# Patient Record
Sex: Male | Born: 1985
Health system: Southern US, Community
[De-identification: ages and names within clinical notes are randomized; demographics above are authoritative.]

## PROBLEM LIST (undated history)

## (undated) DIAGNOSIS — M2550 Pain in unspecified joint: Secondary | ICD-10-CM

## (undated) DIAGNOSIS — R079 Chest pain, unspecified: Secondary | ICD-10-CM

## (undated) DIAGNOSIS — M79672 Pain in left foot: Secondary | ICD-10-CM

## (undated) DIAGNOSIS — M79671 Pain in right foot: Secondary | ICD-10-CM

## (undated) DIAGNOSIS — I10 Essential (primary) hypertension: Secondary | ICD-10-CM

## (undated) DIAGNOSIS — L509 Urticaria, unspecified: Secondary | ICD-10-CM

## (undated) DIAGNOSIS — L309 Dermatitis, unspecified: Secondary | ICD-10-CM

## (undated) DIAGNOSIS — F32A Depression, unspecified: Secondary | ICD-10-CM

## (undated) DIAGNOSIS — R0602 Shortness of breath: Secondary | ICD-10-CM

## (undated) DIAGNOSIS — M25519 Pain in unspecified shoulder: Secondary | ICD-10-CM

## (undated) DIAGNOSIS — T7840XA Allergy, unspecified, initial encounter: Secondary | ICD-10-CM

## (undated) DIAGNOSIS — M549 Dorsalgia, unspecified: Secondary | ICD-10-CM

## (undated) DIAGNOSIS — F419 Anxiety disorder, unspecified: Secondary | ICD-10-CM

## (undated) DIAGNOSIS — M25569 Pain in unspecified knee: Secondary | ICD-10-CM

## (undated) HISTORY — DX: Pain in unspecified shoulder: M25.519

## (undated) HISTORY — DX: Dorsalgia, unspecified: M54.9

## (undated) HISTORY — DX: Shortness of breath: R06.02

## (undated) HISTORY — DX: Anxiety disorder, unspecified: F41.9

## (undated) HISTORY — DX: Depression, unspecified: F32.A

## (undated) HISTORY — DX: Pain in right foot: M79.671

## (undated) HISTORY — DX: Urticaria, unspecified: L50.9

## (undated) HISTORY — DX: Pain in right foot: M79.672

## (undated) HISTORY — DX: Chest pain, unspecified: R07.9

## (undated) HISTORY — DX: Dermatitis, unspecified: L30.9

## (undated) HISTORY — DX: Pain in unspecified joint: M25.50

## (undated) HISTORY — DX: Pain in unspecified knee: M25.569

## (undated) HISTORY — DX: Allergy, unspecified, initial encounter: T78.40XA

---
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
## 2008-10-20 ENCOUNTER — Emergency Department (HOSPITAL_COMMUNITY): Admission: EM | Admit: 2008-10-20 | Discharge: 2008-10-20 | Payer: Self-pay | Admitting: Emergency Medicine

## 2012-12-11 ENCOUNTER — Ambulatory Visit (INDEPENDENT_AMBULATORY_CARE_PROVIDER_SITE_OTHER): Payer: BC Managed Care – PPO | Admitting: Family Medicine

## 2012-12-11 VITALS — BP 119/81 | HR 88 | Temp 97.8°F | Resp 16 | Ht 69.5 in | Wt 343.6 lb

## 2012-12-11 DIAGNOSIS — L259 Unspecified contact dermatitis, unspecified cause: Secondary | ICD-10-CM

## 2012-12-11 DIAGNOSIS — L309 Dermatitis, unspecified: Secondary | ICD-10-CM

## 2012-12-11 DIAGNOSIS — Z113 Encounter for screening for infections with a predominantly sexual mode of transmission: Secondary | ICD-10-CM

## 2012-12-11 DIAGNOSIS — Z0000 Encounter for general adult medical examination without abnormal findings: Secondary | ICD-10-CM

## 2012-12-11 LAB — POCT CBC
Granulocyte percent: 50.9 %G (ref 37–80)
HCT, POC: 50 % (ref 43.5–53.7)
Hemoglobin: 16 g/dL (ref 14.1–18.1)
Lymph, poc: 2.8 (ref 0.6–3.4)
MCH, POC: 27.8 pg (ref 27–31.2)
MCHC: 32 g/dL (ref 31.8–35.4)
MCV: 87 fL (ref 80–97)
MID (cbc): 0.5 (ref 0–0.9)
MPV: 8.7 fL (ref 0–99.8)
POC Granulocyte: 3.5 (ref 2–6.9)
POC LYMPH PERCENT: 41.6 %L (ref 10–50)
POC MID %: 7.5 % (ref 0–12)
Platelet Count, POC: 454 10*3/uL — AB (ref 142–424)
RBC: 5.75 M/uL (ref 4.69–6.13)
RDW, POC: 14.3 %
WBC: 6.8 10*3/uL (ref 4.6–10.2)

## 2012-12-11 LAB — COMPREHENSIVE METABOLIC PANEL
ALT: 19 U/L (ref 0–53)
Albumin: 4.4 g/dL (ref 3.5–5.2)
CO2: 30 mEq/L (ref 19–32)
Chloride: 102 mEq/L (ref 96–112)
Glucose, Bld: 81 mg/dL (ref 70–99)
Potassium: 4.8 mEq/L (ref 3.5–5.3)
Sodium: 140 mEq/L (ref 135–145)
Total Protein: 7.8 g/dL (ref 6.0–8.3)

## 2012-12-11 LAB — COMPREHENSIVE METABOLIC PANEL WITH GFR
AST: 30 U/L (ref 0–37)
Alkaline Phosphatase: 75 U/L (ref 39–117)
BUN: 18 mg/dL (ref 6–23)
Calcium: 10 mg/dL (ref 8.4–10.5)
Creat: 1.13 mg/dL (ref 0.50–1.35)
Total Bilirubin: 0.4 mg/dL (ref 0.3–1.2)

## 2012-12-11 LAB — HIV ANTIBODY (ROUTINE TESTING W REFLEX): HIV: NONREACTIVE

## 2012-12-11 LAB — RPR

## 2012-12-11 LAB — POCT GLYCOSYLATED HEMOGLOBIN (HGB A1C): Hemoglobin A1C: 5.1

## 2012-12-11 LAB — HEPATITIS B SURFACE ANTIGEN: Hepatitis B Surface Ag: NEGATIVE

## 2012-12-11 LAB — LDL CHOLESTEROL, DIRECT: Direct LDL: 141 mg/dL — ABNORMAL HIGH

## 2012-12-11 LAB — HEPATITIS C ANTIBODY: HCV Ab: NEGATIVE

## 2012-12-11 MED ORDER — TRIAMCINOLONE ACETONIDE 0.1 % EX CREA: TOPICAL_CREAM | Freq: Three times a day (TID) | CUTANEOUS | Status: DC

## 2012-12-11 NOTE — Progress Notes (Signed)
[facility]:  Office Visit  Chief Complaint:  Chief Complaint  Patient presents with  . Annual Exam    with forms    HPI: Shawn [name] is a 27 y.o. male who complains of  CPE. No complaints. Has eczema on hand. He is in graduate school at [employer], agricultural degree with an emphasis on ways to improve global food shortage . He denies having any STDs. Has some knee injuries from playing football in HS but never any surgeries.   Past Medical History  Diagnosis Date  . Allergy    History reviewed. No pertinent past surgical history. History   Social History  . Marital Status: Single    Spouse Name: N/A    Number of Children: N/A  . Years of Education: N/A   Social History Main Topics  . Smoking status: Never Smoker   . Smokeless tobacco: None  . Alcohol Use: Yes  . Drug Use: No  . Sexually Active: Yes    Birth Control/ Protection: Condom   Other Topics Concern  . None   Social History Narrative  . None   Family History  Problem Relation Age of Onset  . Cataracts Maternal Grandfather   . Diabetes Maternal Aunt   . Hypertension Maternal Aunt   . Diabetes Maternal Uncle    No Known Allergies Prior to Admission medications   Not on File     ROS: The patient denies fevers, chills, night sweats, unintentional weight loss, chest pain, palpitations, wheezing, dyspnea on exertion, nausea, vomiting, abdominal pain, dysuria, hematuria, melena, numbness, weakness, + tingling/numbnes   All other systems have been reviewed and were otherwise negative with the exception of those mentioned in the HPI and as above.    PHYSICAL EXAM: Filed Vitals:   12/11/12 1557  BP: 119/81  Pulse: 88  Temp: 97.8 F (36.6 C)  Resp: 16   Filed Vitals:   12/11/12 1557  Height: 5' 9.5" (1.765 m)  Weight: 343 lb 9.6 oz (155.856 kg)   Body mass index is 50.01 kg/(m^2).  General: Alert, no acute distress. Morbidly obese AA male HEENT:  Normocephalic, atraumatic,  oropharynx patent. EOMI, PERRLA, fundoscopic exam nl Cardiovascular:  Regular rate and rhythm, no rubs murmurs or gallops.  No Carotid bruits, radial pulse intact. No pedal edema.  Respiratory: Clear to auscultation bilaterally.  No wheezes, rales, or rhonchi.  No cyanosis, no use of accessory musculature GI: No organomegaly, abdomen is soft and non-tender, positive bowel sounds.  No masses. Skin: + right index, middle finer eczema  Neurologic: Facial musculature symmetric. Psychiatric: Patient is appropriate throughout our interaction. GU-nl testicles, no masses, lesions, no hernias. Lymphatic: No cervical lymphadenopathy Musculoskeletal: Gait intact. No scoliosis   LABS: Results for orders placed in visit on 12/11/12  POCT CBC      Component Value Range   WBC 6.8  4.6 - 10.2 K/uL   Lymph, poc 2.8  0.6 - 3.4   POC LYMPH PERCENT 41.6  10 - 50 %L   MID (cbc) 0.5  0 - 0.9   POC MID % 7.5  0 - 12 %M   POC Granulocyte 3.5  2 - 6.9   Granulocyte percent 50.9  37 - 80 %G   RBC 5.75  4.69 - 6.13 M/uL   Hemoglobin 16.0  14.1 - 18.1 g/dL   HCT, POC 78.4  69.6 - 53.7 %   MCV 87.0  80 - 97 fL   MCH, POC 27.8  27 - 31.2 pg   MCHC 32.0  31.8 - 35.4 g/dL   RDW, POC 40.9     Platelet Count, POC 454 (*) 142 - 424 K/uL   MPV 8.7  0 - 99.8 fL  POCT GLYCOSYLATED HEMOGLOBIN (HGB A1C)      Component Value Range   Hemoglobin A1C 5.1       EKG/XRAY:   Primary read interpreted by Dr. Conley Rolls at [facility].   ASSESSMENT/PLAN: Encounter Diagnoses  Name Primary?  . Annual physical exam Yes  . Screening for STD (sexually transmitted disease)   . Eczema    Doing well. D/w patient that his BMI was high and he understands he needs to lose weight.  We screened him for DM due to strong family hx and he was not diabetic Annual labs with STD screening.  Rx Triamcinolone cream for eczema, if too expensive can try vaseline F/u in 1 year or prn   Priyana Mccarey PHUONG, DO 12/11/2012 4:39 PM

## 2012-12-12 LAB — HSV(HERPES SIMPLEX VRS) I + II AB-IGG
HSV 1 Glycoprotein G Ab, IgG: 9.33 IV — ABNORMAL HIGH
HSV 2 Glycoprotein G Ab, IgG: 0.1 IV

## 2012-12-12 LAB — GC/CHLAMYDIA PROBE AMP, URINE
Chlamydia, Swab/Urine, PCR: NEGATIVE
GC Probe Amp, Urine: NEGATIVE

## 2012-12-12 LAB — HEPATITIS B SURFACE ANTIBODY, QUANTITATIVE: Hep B S AB Quant (Post): 9.4 m[IU]/mL

## 2012-12-14 ENCOUNTER — Telehealth: Payer: Self-pay | Admitting: Family Medicine

## 2012-12-14 NOTE — Telephone Encounter (Signed)
LM that he should call back for lab results. Whoever answers on clinical team can let him know that:  Electrolytes, kidney, liver are normal LDL cholesterol is slightly high but with diet and exercise that can be improved. No medicines needed now. His STD tests came back all negative. 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 Ask him if he has had his complete Hepatitis B vaccine series because he may need to get it again. His Hepatitis B titers show that he has not been vaccinated.

## 2012-12-15 ENCOUNTER — Telehealth: Payer: Self-pay

## 2012-12-15 NOTE — Telephone Encounter (Signed)
PT WAS RETURNING OUR CALL ABOUT HIS LAB RESULTS.  SEE DR. Irwin Brakeman PREVIOUS PHONE MESSAGE.  [phone]

## 2012-12-16 NOTE — Telephone Encounter (Signed)
Spoke with the pt and went over his results with him. He wanted to know if you could place future order for him to start hep B vaccination series. He stated in middle school he had Hep B series... Please adivse, pt will need call back once approval on hep b series.

## 2012-12-18 NOTE — Telephone Encounter (Signed)
Called patient. His hep B vaccine hx did not make sense---Hep B in middle school?  Told him to ask his mom about vaccine recors, did he get only 1 or 2 or all 3 injections for Hep B? If he does not need all 3 then we can just give him the injections he missed. He will call us back.

## 2013-02-09 ENCOUNTER — Ambulatory Visit (INDEPENDENT_AMBULATORY_CARE_PROVIDER_SITE_OTHER): Payer: BC Managed Care – PPO | Admitting: Family Medicine

## 2013-02-09 VITALS — BP 135/83 | HR 92 | Temp 98.9°F | Resp 18 | Ht 70.78 in | Wt 335.8 lb

## 2013-02-09 DIAGNOSIS — R1110 Vomiting, unspecified: Secondary | ICD-10-CM

## 2013-02-09 DIAGNOSIS — R05 Cough: Secondary | ICD-10-CM

## 2013-02-09 DIAGNOSIS — R059 Cough, unspecified: Secondary | ICD-10-CM

## 2013-02-09 MED ORDER — HYDROCODONE-HOMATROPINE 5-1.5 MG/5ML PO SYRP: ORAL_SOLUTION | ORAL | Status: DC

## 2013-02-09 MED ORDER — BENZONATATE 100 MG PO CAPS: 100.0000 mg | ORAL_CAPSULE | Freq: Three times a day (TID) | ORAL | Status: DC | PRN

## 2013-02-09 MED ORDER — AZITHROMYCIN 250 MG PO TABS: ORAL_TABLET | ORAL | Status: DC

## 2013-02-09 NOTE — Progress Notes (Signed)
Subjective:    Patient ID: Shawn [name], male    DOB: 1986-03-17, 27 y.o.   MRN: [id number]  HPI Shawn [name] is a 27 y.o. male  Cough - sore throat.  Cough for past week.  Subjective fever/chills.  Cough - productive - yellow mucus. No dyspnea. Slightly less sore throat, but still with productive cough.   Vomiting past few days.  Notes cough after eating, then post-tussive emesis.  No freestanding emesis. No diarrhea.  Drinking fluids ok. Less appetite, but keeping fluids down.  No known sick contacts.  Nonsmoker.   Tx: hot tea, benadryl, allegra.    Review of Systems  Constitutional: Negative for fever.  HENT: Positive for sore throat.   Respiratory: Positive for cough. Negative for shortness of breath and wheezing.   Gastrointestinal: Positive for vomiting. Negative for nausea, abdominal pain and diarrhea.  Musculoskeletal: Positive for myalgias.  Skin: Negative for rash.   As above.     Objective:   Physical Exam  Vitals reviewed. Constitutional: He is oriented to person, place, and time. He appears well-developed and well-nourished.  HENT:  Head: Normocephalic and atraumatic.  Right Ear: Tympanic membrane, external ear and ear canal normal.  Left Ear: Tympanic membrane, external ear and ear canal normal.  Nose: No rhinorrhea.  Mouth/Throat: Oropharynx is clear and moist and mucous membranes are normal. No oropharyngeal exudate or posterior oropharyngeal erythema.  Eyes: Conjunctivae are normal. Pupils are equal, round, and reactive to light.  Neck: Neck supple.  Cardiovascular: Normal rate, regular rhythm, normal heart sounds and intact distal pulses.   No murmur heard. Pulmonary/Chest: Effort normal and breath sounds normal. He has no wheezes. He has no rhonchi. He has no rales.  Abdominal: Soft. There is no tenderness.  Lymphadenopathy:    He has no cervical adenopathy.  Neurological: He is alert and oriented to person, place, and time.  Skin: Skin is  warm and dry. No rash noted.  Psychiatric: He has a normal mood and affect. His behavior is normal.          Assessment & Plan:  Shawn [name] is a 27 y.o. male Cough - Plan: benzonatate (TESSALON) 100 MG capsule, azithromycin (ZITHROMAX) 250 MG tablet, HYDROcodone-homatropine (HYCODAN) 5-1.5 MG/5ML syrup  Post-tussive emesis - Plan: benzonatate (TESSALON) 100 MG capsule, HYDROcodone-homatropine (HYCODAN) 5-1.5 MG/5ML syrup  URI- early bronchitis with postussive emesis - reassuring exam. Zpak.  Tessalon or mucinex during day, hycodan qhs prn.  fluids, bland diet. rtc precautions.  Patient Instructions  mucinex Dm OR tessalon perles during the day, hycodan at night if needed for cough. Return to the clinic or go to the nearest emergency room if any of your symptoms worsen or new symptoms occur.  Cough, Adult  A cough is a reflex that helps clear your throat and airways. It can help heal the body or may be a reaction to an irritated airway. A cough may only last 2 or 3 weeks (acute) or may last more than 8 weeks (chronic).  CAUSES Acute cough:  Viral or bacterial infections. Chronic cough:  Infections.  Allergies.  Asthma.  Post-nasal drip.  Smoking.  Heartburn or acid reflux.  Some medicines.  Chronic lung problems (COPD).  Cancer. SYMPTOMS   Cough.  Fever.  Chest pain.  Increased breathing rate.  High-pitched whistling sound when breathing (wheezing).  Colored mucus that you cough up (sputum). TREATMENT   A bacterial cough may be treated with antibiotic medicine.  A viral cough must run  its course and will not respond to antibiotics.  Your caregiver may recommend other treatments if you have a chronic cough. HOME CARE INSTRUCTIONS   Only take over-the-counter or prescription medicines for pain, discomfort, or fever as directed by your caregiver. Use cough suppressants only as directed by your caregiver.  Use a cold steam vaporizer or humidifier  in your bedroom or home to help loosen secretions.  Sleep in a semi-upright position if your cough is worse at night.  Rest as needed.  Stop smoking if you smoke. SEEK IMMEDIATE MEDICAL CARE IF:   You have pus in your sputum.  Your cough starts to worsen.  You cannot control your cough with suppressants and are losing sleep.  You begin coughing up blood.  You have difficulty breathing.  You develop pain which is getting worse or is uncontrolled with medicine.  You have a fever. MAKE SURE YOU:   Understand these instructions.  Will watch your condition.  Will get help right away if you are not doing well or get worse. Document Released: 05/06/2011 Document Revised: 01/30/2012 Document Reviewed: 05/06/2011 [facility] Patient Information 2013 [place], [place].

## 2013-02-09 NOTE — Patient Instructions (Signed)
mucinex Dm OR tessalon perles during the day, hycodan at night if needed for cough. Return to the clinic or go to the nearest emergency room if any of your symptoms worsen or new symptoms occur.  Cough, Adult  A cough is a reflex that helps clear your throat and airways. It can help heal the body or may be a reaction to an irritated airway. A cough may only last 2 or 3 weeks (acute) or may last more than 8 weeks (chronic).  CAUSES Acute cough:  Viral or bacterial infections. Chronic cough:  Infections.  Allergies.  Asthma.  Post-nasal drip.  Smoking.  Heartburn or acid reflux.  Some medicines.  Chronic lung problems (COPD).  Cancer. SYMPTOMS   Cough.  Fever.  Chest pain.  Increased breathing rate.  High-pitched whistling sound when breathing (wheezing).  Colored mucus that you cough up (sputum). TREATMENT   A bacterial cough may be treated with antibiotic medicine.  A viral cough must run its course and will not respond to antibiotics.  Your caregiver may recommend other treatments if you have a chronic cough. HOME CARE INSTRUCTIONS   Only take over-the-counter or prescription medicines for pain, discomfort, or fever as directed by your caregiver. Use cough suppressants only as directed by your caregiver.  Use a cold steam vaporizer or humidifier in your bedroom or home to help loosen secretions.  Sleep in a semi-upright position if your cough is worse at night.  Rest as needed.  Stop smoking if you smoke. SEEK IMMEDIATE MEDICAL CARE IF:   You have pus in your sputum.  Your cough starts to worsen.  You cannot control your cough with suppressants and are losing sleep.  You begin coughing up blood.  You have difficulty breathing.  You develop pain which is getting worse or is uncontrolled with medicine.  You have a fever. MAKE SURE YOU:   Understand these instructions.  Will watch your condition.  Will get help right away if you are not  doing well or get worse. Document Released: 05/06/2011 Document Revised: 01/30/2012 Document Reviewed: 05/06/2011 [facility] Patient Information 2013 [place], [place].

## 2017-04-15 DIAGNOSIS — S8392XA Sprain of unspecified site of left knee, initial encounter: Secondary | ICD-10-CM | POA: Diagnosis not present

## 2017-05-05 DIAGNOSIS — J189 Pneumonia, unspecified organism: Secondary | ICD-10-CM | POA: Diagnosis not present

## 2017-05-19 DIAGNOSIS — G8929 Other chronic pain: Secondary | ICD-10-CM | POA: Diagnosis not present

## 2017-05-19 DIAGNOSIS — M25562 Pain in left knee: Secondary | ICD-10-CM | POA: Diagnosis not present

## 2017-06-02 DIAGNOSIS — G8929 Other chronic pain: Secondary | ICD-10-CM | POA: Diagnosis not present

## 2017-06-02 DIAGNOSIS — M25562 Pain in left knee: Secondary | ICD-10-CM | POA: Diagnosis not present

## 2017-06-08 DIAGNOSIS — M25562 Pain in left knee: Secondary | ICD-10-CM | POA: Diagnosis not present

## 2017-06-08 DIAGNOSIS — G8929 Other chronic pain: Secondary | ICD-10-CM | POA: Diagnosis not present

## 2017-06-08 DIAGNOSIS — S86812A Strain of other muscle(s) and tendon(s) at lower leg level, left leg, initial encounter: Secondary | ICD-10-CM | POA: Diagnosis not present

## 2017-06-22 DIAGNOSIS — S86812D Strain of other muscle(s) and tendon(s) at lower leg level, left leg, subsequent encounter: Secondary | ICD-10-CM | POA: Diagnosis not present

## 2017-06-28 DIAGNOSIS — S86812D Strain of other muscle(s) and tendon(s) at lower leg level, left leg, subsequent encounter: Secondary | ICD-10-CM | POA: Diagnosis not present

## 2017-06-30 DIAGNOSIS — S86812D Strain of other muscle(s) and tendon(s) at lower leg level, left leg, subsequent encounter: Secondary | ICD-10-CM | POA: Diagnosis not present

## 2017-07-03 DIAGNOSIS — S86812D Strain of other muscle(s) and tendon(s) at lower leg level, left leg, subsequent encounter: Secondary | ICD-10-CM | POA: Diagnosis not present

## 2017-07-26 DIAGNOSIS — S86812D Strain of other muscle(s) and tendon(s) at lower leg level, left leg, subsequent encounter: Secondary | ICD-10-CM | POA: Diagnosis not present

## 2017-07-31 DIAGNOSIS — S86812D Strain of other muscle(s) and tendon(s) at lower leg level, left leg, subsequent encounter: Secondary | ICD-10-CM | POA: Diagnosis not present

## 2017-08-02 DIAGNOSIS — S86812D Strain of other muscle(s) and tendon(s) at lower leg level, left leg, subsequent encounter: Secondary | ICD-10-CM | POA: Diagnosis not present

## 2017-09-29 DIAGNOSIS — M25562 Pain in left knee: Secondary | ICD-10-CM | POA: Diagnosis not present

## 2017-09-29 DIAGNOSIS — S86812D Strain of other muscle(s) and tendon(s) at lower leg level, left leg, subsequent encounter: Secondary | ICD-10-CM | POA: Diagnosis not present

## 2017-09-29 DIAGNOSIS — G8929 Other chronic pain: Secondary | ICD-10-CM | POA: Diagnosis not present

## 2017-10-23 DIAGNOSIS — S86812D Strain of other muscle(s) and tendon(s) at lower leg level, left leg, subsequent encounter: Secondary | ICD-10-CM | POA: Diagnosis not present

## 2017-10-23 DIAGNOSIS — K080 Exfoliation of teeth due to systemic causes: Secondary | ICD-10-CM | POA: Diagnosis not present

## 2018-01-08 DIAGNOSIS — J209 Acute bronchitis, unspecified: Secondary | ICD-10-CM | POA: Diagnosis not present

## 2018-01-26 DIAGNOSIS — J209 Acute bronchitis, unspecified: Secondary | ICD-10-CM | POA: Diagnosis not present

## 2018-01-26 DIAGNOSIS — M791 Myalgia, unspecified site: Secondary | ICD-10-CM | POA: Diagnosis not present

## 2018-08-03 DIAGNOSIS — F4320 Adjustment disorder, unspecified: Secondary | ICD-10-CM | POA: Diagnosis not present

## 2018-08-17 DIAGNOSIS — F4320 Adjustment disorder, unspecified: Secondary | ICD-10-CM | POA: Diagnosis not present

## 2018-08-27 DIAGNOSIS — F4320 Adjustment disorder, unspecified: Secondary | ICD-10-CM | POA: Diagnosis not present

## 2018-09-05 DIAGNOSIS — F4320 Adjustment disorder, unspecified: Secondary | ICD-10-CM | POA: Diagnosis not present

## 2018-09-12 DIAGNOSIS — F4320 Adjustment disorder, unspecified: Secondary | ICD-10-CM | POA: Diagnosis not present

## 2018-10-16 DIAGNOSIS — M25561 Pain in right knee: Secondary | ICD-10-CM | POA: Diagnosis not present

## 2018-10-16 DIAGNOSIS — M25562 Pain in left knee: Secondary | ICD-10-CM | POA: Diagnosis not present

## 2018-10-22 ENCOUNTER — Ambulatory Visit (INDEPENDENT_AMBULATORY_CARE_PROVIDER_SITE_OTHER): Payer: Federal, State, Local not specified - PPO

## 2018-10-22 ENCOUNTER — Ambulatory Visit: Payer: Federal, State, Local not specified - PPO | Admitting: Family Medicine

## 2018-10-22 ENCOUNTER — Encounter: Payer: Self-pay | Admitting: Family Medicine

## 2018-10-22 VITALS — BP 120/86 | HR 86 | Temp 98.3°F | Ht 71.0 in | Wt 390.6 lb

## 2018-10-22 DIAGNOSIS — Z23 Encounter for immunization: Secondary | ICD-10-CM | POA: Diagnosis not present

## 2018-10-22 DIAGNOSIS — Z0000 Encounter for general adult medical examination without abnormal findings: Secondary | ICD-10-CM | POA: Diagnosis not present

## 2018-10-22 DIAGNOSIS — R918 Other nonspecific abnormal finding of lung field: Secondary | ICD-10-CM | POA: Diagnosis not present

## 2018-10-22 DIAGNOSIS — R05 Cough: Secondary | ICD-10-CM | POA: Diagnosis not present

## 2018-10-22 DIAGNOSIS — R059 Cough, unspecified: Secondary | ICD-10-CM

## 2018-10-22 IMAGING — DX DG CHEST 2V
2 series · 2 of 2 positions shown · non-contrast
Comparison: None.

CLINICAL DATA: Chronic cough for 12 months.

EXAM:
CHEST - 2 VIEW

[chest pa]
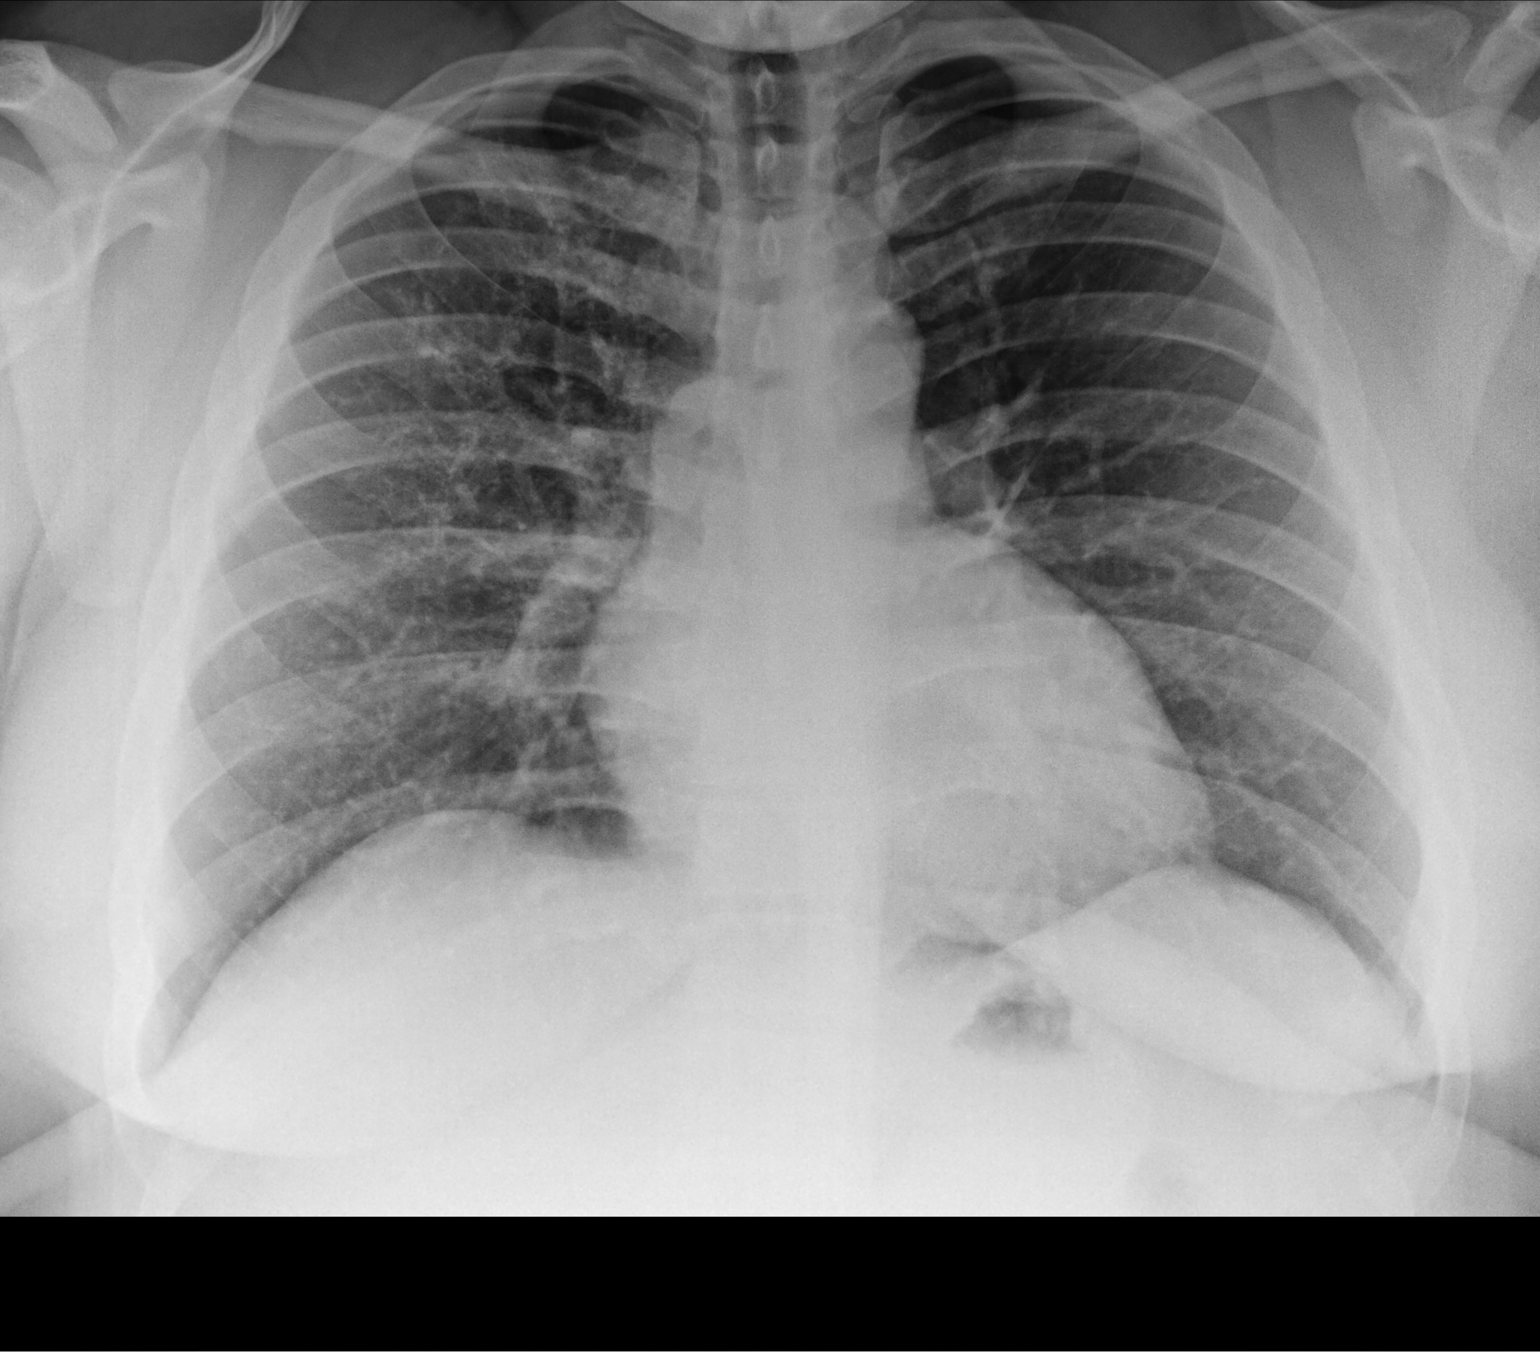

[chest lat]
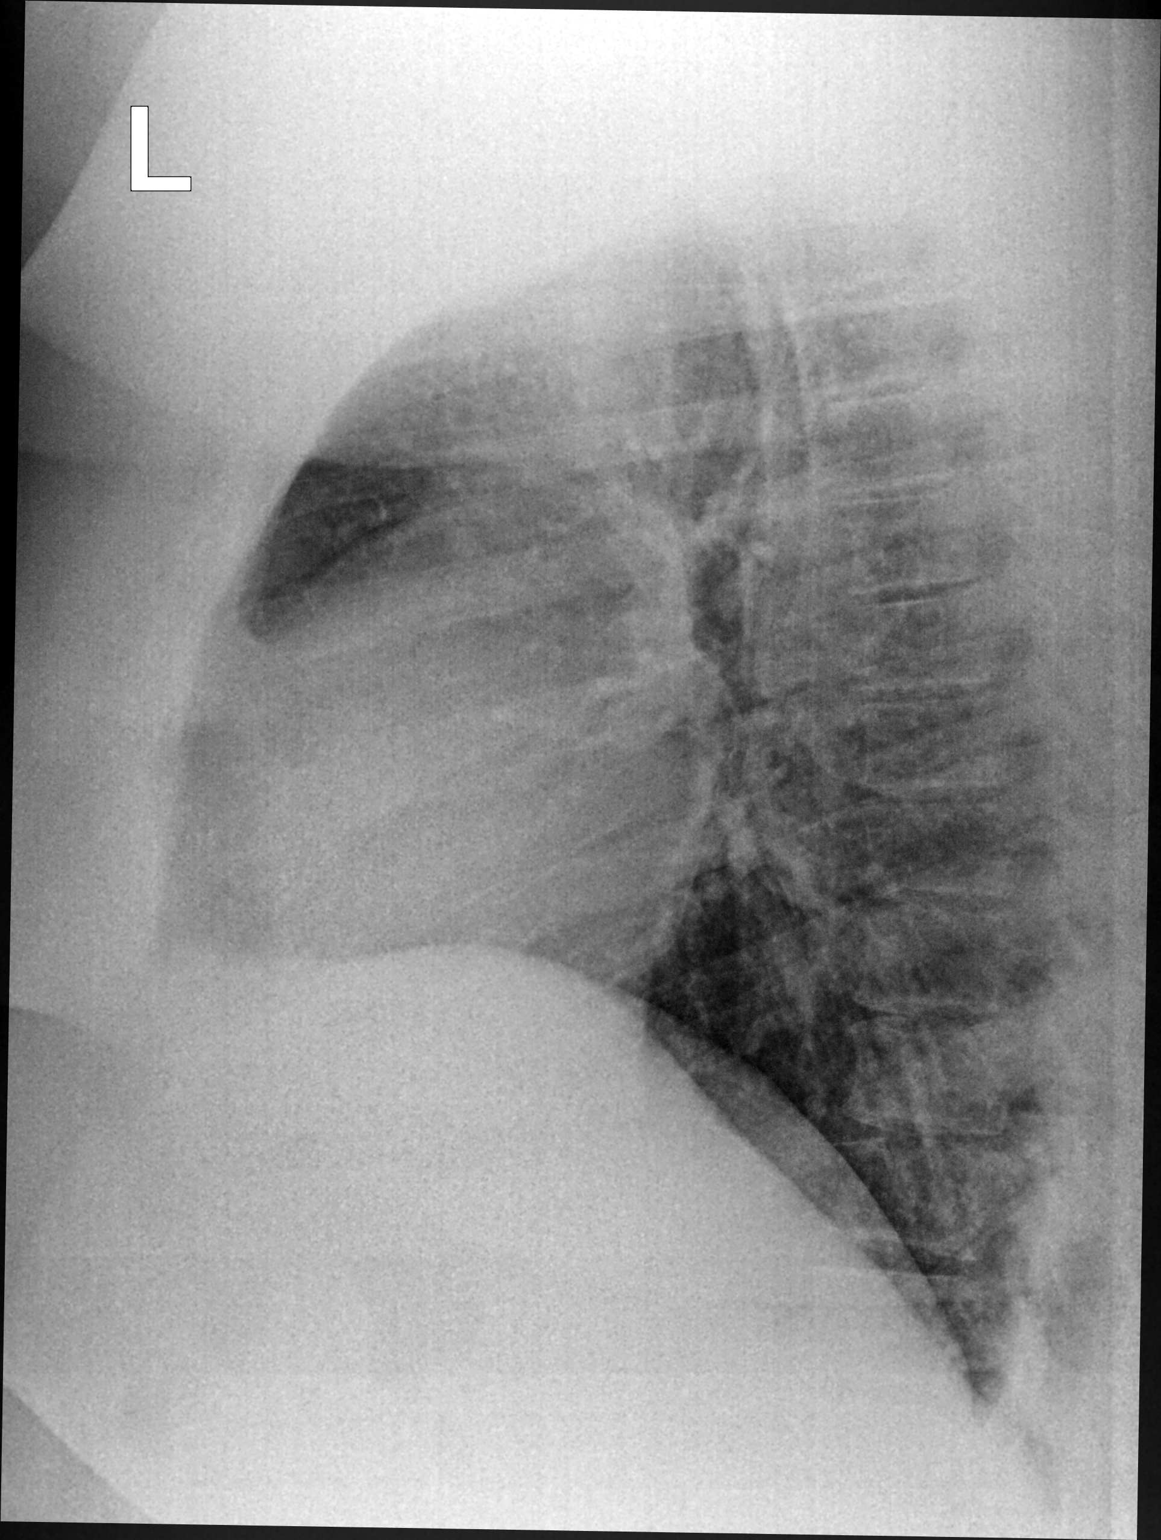

[2 of 2 positions shown; findings below may reference images not displayed]

FINDINGS: The heart size and mediastinal contours are within normal limits.
Asymmetric streaky opacity is seen in the right upper lobe,
suspicious for pneumonia. Left lung is clear. No evidence of pleural
effusion. The visualized skeletal structures are unremarkable.
IMPRESSION: Mild asymmetric right upper lobe opacity, suspicious for pneumonia.

## 2018-10-22 MED ORDER — FLUTICASONE PROPIONATE 50 MCG/ACT NA SUSP: 2.0000 | Freq: Every day | NASAL | 6 refills | Status: DC

## 2018-10-22 NOTE — Patient Instructions (Signed)
-  do flonase at night for 2+ weeks. If do not seem improvement I want you to try some over the counter reflux medication x 2 weeks. If not better in one month, email me because we need to send you to lung doctor.   -work on weight loss. Will help joints.    Postnasal Drip Postnasal drip is the feeling of mucus going down the back of your throat. Mucus is a slimy substance that moistens and cleans your nose and throat, as well as the air pockets in face bones near your forehead and cheeks (sinuses). Small amounts of mucus pass from your nose and sinuses down the back of your throat all the time. This is normal. When you produce too much mucus or the mucus gets too thick, you can feel it. Some common causes of postnasal drip include:  Having more mucus because of: ? A cold or the flu. ? Allergies. ? Cold air. ? Certain medicines.  Having more mucus that is thicker because of: ? A sinus or nasal infection. ? Dry air. ? A food allergy.  Follow these instructions at home: Relieving discomfort  Gargle with a salt-water mixture 3-4 times a day or as needed. To make a salt-water mixture, completely dissolve -1 tsp of salt in 1 cup of warm water.  If the air in your home is dry, use a humidifier to add moisture to the air.  Use a saline spray or container (neti pot) to flush out the nose (nasal irrigation). These methods can help clear away mucus and keep the nasal passages moist. General instructions  Take over-the-counter and prescription medicines only as told by your health care provider.  Follow instructions from your health care provider about eating or drinking restrictions. You may need to avoid caffeine.  Avoid things that you know you are allergic to (allergens), like dust, mold, pollen, pets, or certain foods.  Drink enough fluid to keep your urine pale yellow.  Keep all follow-up visits as told by your health care provider. This is important. Contact a health care provider  if:  You have a fever.  You have a sore throat.  You have difficulty swallowing.  You have headache.  You have sinus pain.  You have a cough that does not go away.  The mucus from your nose becomes thick and is green or yellow in color.  You have cold or flu symptoms that last more than 10 days. Summary  Postnasal drip is the feeling of mucus going down the back of your throat.  If your health care provider approves, use nasal irrigation or a nasal spray 2?4 times a day.  Avoid things that you know you are allergic to (allergens), like dust, mold, pollen, pets, or certain foods. This information is not intended to replace advice given to you by your health care provider. Make sure you discuss any questions you have with your health care provider. Document Released: 02/20/2017 Document Revised: 02/20/2017 Document Reviewed: 02/20/2017 Elsevier Interactive Patient Education  [employer]2018 [employer] Inc.

## 2018-10-22 NOTE — Progress Notes (Signed)
Patient: Shawn [name] MRN: [id number] DOB: 01/03/1986 PCP: [name], MD     Subjective:  Chief Complaint  Patient presents with  . Establish Care    HPI: The patient is a 32 y.o. male who presents today for annual exam. He denies any changes to past medical history. There have been no recent hospitalizations. They are following not following a well balanced diet and exercise plan. Weight has been stable. No complaints today. He has no family hx. Only child. He only knows of arthritis in his mom. He did lose around 25 pounds last year, but gained it all back.   He has had a cough for over a year. He had pneumonia last year and states his cough has just lingered since that time. Cough is much better. He still has spells where he coughs consistnetly. He does not smoke. Coughed up blood last week, but has not happened again and it was after he snorted hard up his nose. 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 He usually doesn't go into the houses though. (masters in Orthoptist business).   Immunization History  Administered Date(s) Administered  . Tdap 10/22/2018    Flu: declines  Tdap: today  Gc/C: sexually active. Not a new partner. Declines screening.  hiv tested in 2014.   Review of Systems  Constitutional: Negative for chills, fatigue and fever.  HENT: Negative for dental problem, ear pain, hearing loss and trouble swallowing.   Eyes: Negative for visual disturbance.  Respiratory: Positive for chest tightness (from cough ). Negative for cough and shortness of breath.   Cardiovascular: Negative for chest pain, palpitations and leg swelling.       Had slight chest discomfort from cough last week  Gastrointestinal: Negative for abdominal pain, blood in stool, diarrhea and nausea.  Endocrine: Negative for cold intolerance, polydipsia, polyphagia and polyuria.  Genitourinary: Negative for dysuria and hematuria.  Musculoskeletal: Negative for  arthralgias, back pain and neck pain.  Skin: Positive for rash.       Pt has eczema on back of neck and chest  Neurological: Negative for dizziness and headaches.  Psychiatric/Behavioral: Positive for sleep disturbance. Negative for dysphoric mood. The patient is not nervous/anxious.     Allergies Patient has No Known Allergies.  Past Medical History Patient  has a past medical history of Allergy.  Surgical History Patient  has no past surgical history on file.  Family History Pateint's family history includes Arthritis in his mother; Cataracts in his maternal grandfather; Diabetes in his maternal aunt and maternal uncle; Hypertension in his maternal aunt.  Social History Patient  reports that he has never smoked. He has never used smokeless tobacco. He reports that he drinks alcohol. He reports that he does not use drugs.    Objective: Vitals:   10/22/18 1423  BP: 120/86  Pulse: 86  Temp: 98.3 F (36.8 C)  TempSrc: Oral  SpO2: 98%  Weight: (!) 390 lb 9.6 oz (177.2 kg)  Height: 5\' 11"  (1.803 m)    Body mass index is 54.48 kg/m.  Physical Exam  Constitutional: He is oriented to person, place, and time. He appears well-developed and well-nourished.  HENT:  Right Ear: External ear normal.  Left Ear: External ear normal.  Mouth/Throat: Oropharynx is clear and moist.  Eyes: Pupils are equal, round, and reactive to light. Conjunctivae and EOM are normal.  Neck: Normal range of motion. Neck supple. No thyromegaly present.  Cardiovascular: Normal rate,  regular rhythm, normal heart sounds and intact distal pulses.  No murmur heard. Pulmonary/Chest: Effort normal and breath sounds normal.  Abdominal: Soft. Bowel sounds are normal. He exhibits no distension. There is no tenderness.  Lymphadenopathy:    He has no cervical adenopathy.  Neurological: He is alert and oriented to person, place, and time. He displays normal reflexes. No cranial nerve deficit. Coordination normal.   Skin: Skin is warm and dry. No rash noted.  Acanthosis nigricans on his posterior neck folds   Psychiatric: He has a normal mood and affect. His behavior is normal.  Vitals reviewed.     CXR: no acute process. Official read pending  Depression screen [facility] 10/22/2018  Decreased Interest 0  Down, Depressed, Hopeless 0  PHQ - 2 Score 0    Assessment/plan: 1. Annual physical exam Routine lab work and tdap today. Has had hiv. Declines std screen and flu shot. Really want him to work on weight loss to help joints. Also discussed sleep apnea. No apeinic episodes, but does snore. He is going to ask his girlfriend.  Patient counseling [x]    Nutrition: Stressed importance of moderation in sodium/caffeine intake, saturated fat and cholesterol, caloric balance, sufficient intake of fresh fruits, vegetables, fiber, calcium, iron, and 1 mg of folate supplement per day (for females capable of pregnancy).  [x]    Stressed the importance of regular exercise.   []    Substance Abuse: Discussed cessation/primary prevention of tobacco, alcohol, or other drug use; driving or other dangerous activities under the influence; availability of treatment for abuse.   [x]    Injury prevention: Discussed safety belts, safety helmets, smoke detector, smoking near bedding or upholstery.   [x]    Sexuality: Discussed sexually transmitted diseases, partner selection, use of condoms, avoidance of unintended pregnancy  and contraceptive alternatives.  [x]    Dental health: Discussed importance of regular tooth brushing, flossing, and dental visits.  [x]    Health maintenance and immunizations reviewed. Please refer to Health maintenance section.    - Comprehensive metabolic panel - CBC with Differential/Platelet - Lipid panel - TSH  2. Cough x1 year. He does have signs of post nasal drip and will treat him for this with flonase. Also wonder about possible silent reflux. I do not see anything acute on cxr, but official  read pending. 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 If reflux and post nasal drip treatment do not work discussed that he needs to see pulm for further  Work up. Will let me know.   Also wonder about sleep apnea. He is going to ask his girlfriend about apneic episodes, but he does not think this happens and that he just snores. Again, want him to work on weight loss.  - DG Chest 2 View; Future  3. Need for Tdap vaccination  - Tdap vaccine greater than or equal to 7yo IM     Return if symptoms worsen or fail to improve.     Orland MustardAllison Breiona Couvillon, MD  [place] [facility]  10/24/2018

## 2018-10-23 LAB — LIPID PANEL
CHOL/HDL RATIO: 6
Cholesterol: 228 mg/dL — ABNORMAL HIGH (ref 0–200)
HDL: 37.4 mg/dL — AB (ref >39.00)
LDL CALC: 160 mg/dL — AB (ref 0–99)
NONHDL: 190.19
TRIGLYCERIDES: 153 mg/dL — AB (ref 0.0–149.0)
VLDL: 30.6 mg/dL (ref 0.0–40.0)

## 2018-10-23 LAB — CBC WITH DIFFERENTIAL/PLATELET
BASOS ABS: 0.1 10*3/uL (ref 0.0–0.1)
BASOS PCT: 1 % (ref 0.0–3.0)
EOS ABS: 0.1 10*3/uL (ref 0.0–0.7)
Eosinophils Relative: 2.3 % (ref 0.0–5.0)
HCT: 45.3 % (ref 39.0–52.0)
HEMOGLOBIN: 15.2 g/dL (ref 13.0–17.0)
Lymphocytes Relative: 38 % (ref 12.0–46.0)
Lymphs Abs: 2.3 10*3/uL (ref 0.7–4.0)
MCHC: 33.7 g/dL (ref 30.0–36.0)
MCV: 82.2 fl (ref 78.0–100.0)
MONOS PCT: 7 % (ref 3.0–12.0)
Monocytes Absolute: 0.4 10*3/uL (ref 0.1–1.0)
Neutro Abs: 3.1 10*3/uL (ref 1.4–7.7)
Neutrophils Relative %: 51.7 % (ref 43.0–77.0)
Platelets: 396 10*3/uL (ref 150.0–400.0)
RBC: 5.5 Mil/uL (ref 4.22–5.81)
RDW: 14.2 % (ref 11.5–15.5)
WBC: 6 10*3/uL (ref 4.0–10.5)

## 2018-10-23 LAB — COMPREHENSIVE METABOLIC PANEL
ALK PHOS: 95 U/L (ref 39–117)
ALT: 15 U/L (ref 0–53)
AST: 18 U/L (ref 0–37)
Albumin: 4.4 g/dL (ref 3.5–5.2)
BUN: 12 mg/dL (ref 6–23)
CALCIUM: 9.5 mg/dL (ref 8.4–10.5)
CO2: 27 mEq/L (ref 19–32)
Chloride: 101 mEq/L (ref 96–112)
Creatinine, Ser: 1.15 mg/dL (ref 0.40–1.50)
GFR: 95.85 mL/min (ref >60.00)
GLUCOSE: 88 mg/dL (ref 70–99)
POTASSIUM: 4.2 meq/L (ref 3.5–5.1)
Sodium: 138 mEq/L (ref 135–145)
TOTAL PROTEIN: 8.1 g/dL (ref 6.0–8.3)
Total Bilirubin: 0.3 mg/dL (ref 0.2–1.2)

## 2018-10-23 LAB — TSH: TSH: 1.96 u[IU]/mL (ref 0.35–4.50)

## 2018-10-24 ENCOUNTER — Encounter: Payer: Self-pay | Admitting: Family Medicine

## 2018-10-24 ENCOUNTER — Other Ambulatory Visit: Payer: Self-pay | Admitting: Family Medicine

## 2018-10-24 DIAGNOSIS — E782 Mixed hyperlipidemia: Secondary | ICD-10-CM

## 2018-10-24 DIAGNOSIS — E7800 Pure hypercholesterolemia, unspecified: Secondary | ICD-10-CM | POA: Insufficient documentation

## 2018-10-24 DIAGNOSIS — E785 Hyperlipidemia, unspecified: Secondary | ICD-10-CM | POA: Insufficient documentation

## 2018-10-24 MED ORDER — AZITHROMYCIN 250 MG PO TABS: ORAL_TABLET | ORAL | 0 refills | Status: DC

## 2018-10-24 NOTE — Progress Notes (Signed)
zpack

## 2018-10-26 ENCOUNTER — Encounter: Payer: Self-pay | Admitting: Family Medicine

## 2018-10-29 ENCOUNTER — Other Ambulatory Visit: Payer: Self-pay

## 2018-10-29 DIAGNOSIS — K080 Exfoliation of teeth due to systemic causes: Secondary | ICD-10-CM | POA: Diagnosis not present

## 2018-10-29 MED ORDER — AZITHROMYCIN 250 MG PO TABS: ORAL_TABLET | ORAL | 0 refills | Status: DC

## 2018-11-06 DIAGNOSIS — F4320 Adjustment disorder, unspecified: Secondary | ICD-10-CM | POA: Diagnosis not present

## 2018-11-07 ENCOUNTER — Encounter: Payer: Self-pay | Admitting: Family Medicine

## 2018-11-07 DIAGNOSIS — M25561 Pain in right knee: Secondary | ICD-10-CM

## 2018-11-07 DIAGNOSIS — M1991 Primary osteoarthritis, unspecified site: Secondary | ICD-10-CM | POA: Insufficient documentation

## 2018-11-07 DIAGNOSIS — M25562 Pain in left knee: Principal | ICD-10-CM

## 2018-11-07 DIAGNOSIS — M179 Osteoarthritis of knee, unspecified: Secondary | ICD-10-CM | POA: Insufficient documentation

## 2018-11-07 DIAGNOSIS — M170 Bilateral primary osteoarthritis of knee: Secondary | ICD-10-CM

## 2018-11-07 DIAGNOSIS — M1710 Unilateral primary osteoarthritis, unspecified knee: Secondary | ICD-10-CM | POA: Insufficient documentation

## 2018-11-22 DIAGNOSIS — K080 Exfoliation of teeth due to systemic causes: Secondary | ICD-10-CM | POA: Diagnosis not present

## 2018-12-04 DIAGNOSIS — M9903 Segmental and somatic dysfunction of lumbar region: Secondary | ICD-10-CM | POA: Diagnosis not present

## 2018-12-04 DIAGNOSIS — M25562 Pain in left knee: Secondary | ICD-10-CM | POA: Diagnosis not present

## 2018-12-04 DIAGNOSIS — M179 Osteoarthritis of knee, unspecified: Secondary | ICD-10-CM | POA: Diagnosis not present

## 2018-12-04 DIAGNOSIS — M545 Low back pain: Secondary | ICD-10-CM | POA: Diagnosis not present

## 2018-12-04 DIAGNOSIS — M25561 Pain in right knee: Secondary | ICD-10-CM | POA: Diagnosis not present

## 2018-12-04 DIAGNOSIS — M9905 Segmental and somatic dysfunction of pelvic region: Secondary | ICD-10-CM | POA: Diagnosis not present

## 2018-12-04 DIAGNOSIS — M9904 Segmental and somatic dysfunction of sacral region: Secondary | ICD-10-CM | POA: Diagnosis not present

## 2018-12-07 DIAGNOSIS — M9904 Segmental and somatic dysfunction of sacral region: Secondary | ICD-10-CM | POA: Diagnosis not present

## 2018-12-07 DIAGNOSIS — M9905 Segmental and somatic dysfunction of pelvic region: Secondary | ICD-10-CM | POA: Diagnosis not present

## 2018-12-07 DIAGNOSIS — M545 Low back pain: Secondary | ICD-10-CM | POA: Diagnosis not present

## 2018-12-07 DIAGNOSIS — M9903 Segmental and somatic dysfunction of lumbar region: Secondary | ICD-10-CM | POA: Diagnosis not present

## 2018-12-20 ENCOUNTER — Ambulatory Visit: Payer: Federal, State, Local not specified - PPO | Admitting: Family Medicine

## 2018-12-20 ENCOUNTER — Ambulatory Visit (INDEPENDENT_AMBULATORY_CARE_PROVIDER_SITE_OTHER): Payer: Federal, State, Local not specified - PPO

## 2018-12-20 ENCOUNTER — Encounter: Payer: Self-pay | Admitting: Family Medicine

## 2018-12-20 VITALS — BP 120/64 | HR 63 | Temp 98.0°F | Ht 71.0 in | Wt 395.2 lb

## 2018-12-20 DIAGNOSIS — Z6841 Body Mass Index (BMI) 40.0 and over, adult: Secondary | ICD-10-CM | POA: Diagnosis not present

## 2018-12-20 DIAGNOSIS — E782 Mixed hyperlipidemia: Secondary | ICD-10-CM | POA: Diagnosis not present

## 2018-12-20 DIAGNOSIS — R079 Chest pain, unspecified: Secondary | ICD-10-CM | POA: Diagnosis not present

## 2018-12-20 DIAGNOSIS — R05 Cough: Secondary | ICD-10-CM

## 2018-12-20 DIAGNOSIS — R059 Cough, unspecified: Secondary | ICD-10-CM

## 2018-12-20 IMAGING — DX DG CHEST 2V
2 series · 2 of 2 positions shown · non-contrast
Comparison: [DATE]

CLINICAL DATA: Chronic dry cough with intermittent chest pain

EXAM:
CHEST - 2 VIEW

[chest pa]
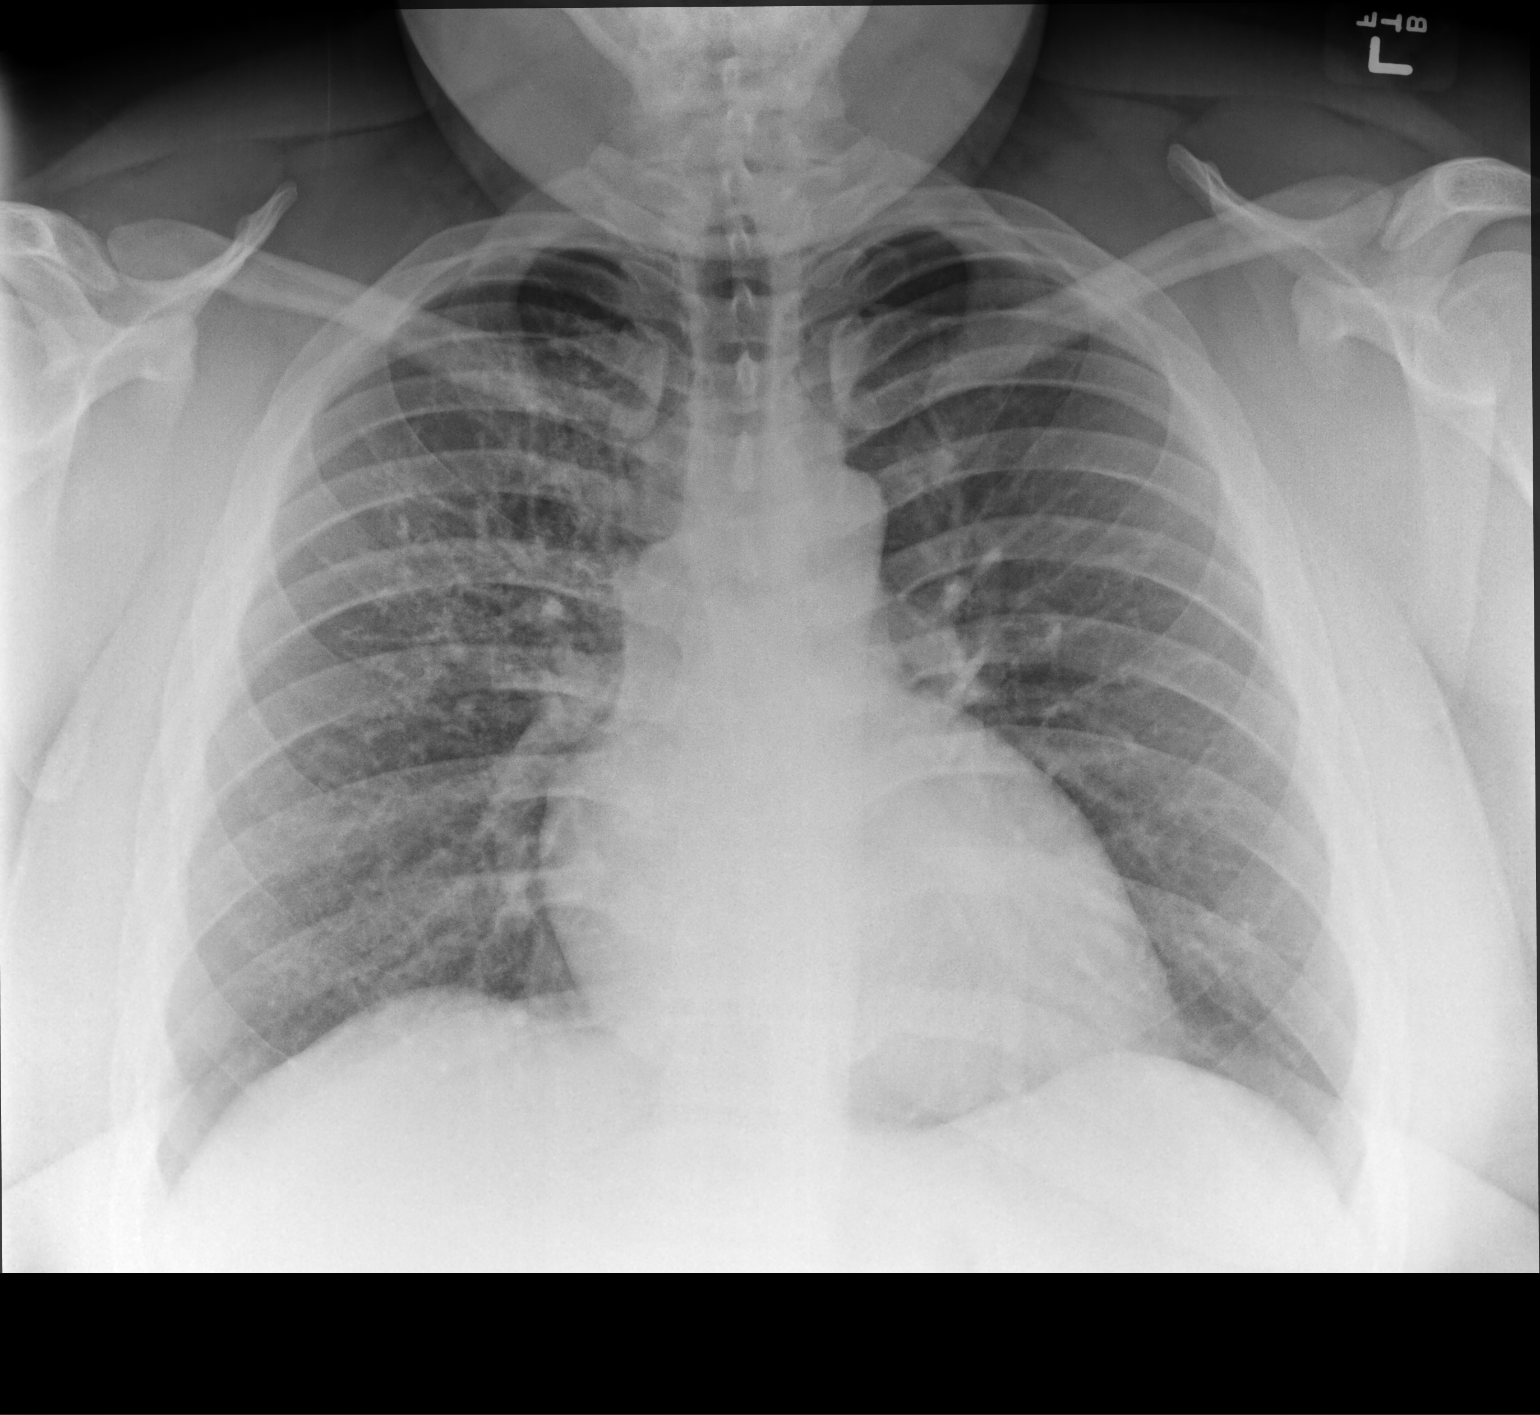

[chest lat]
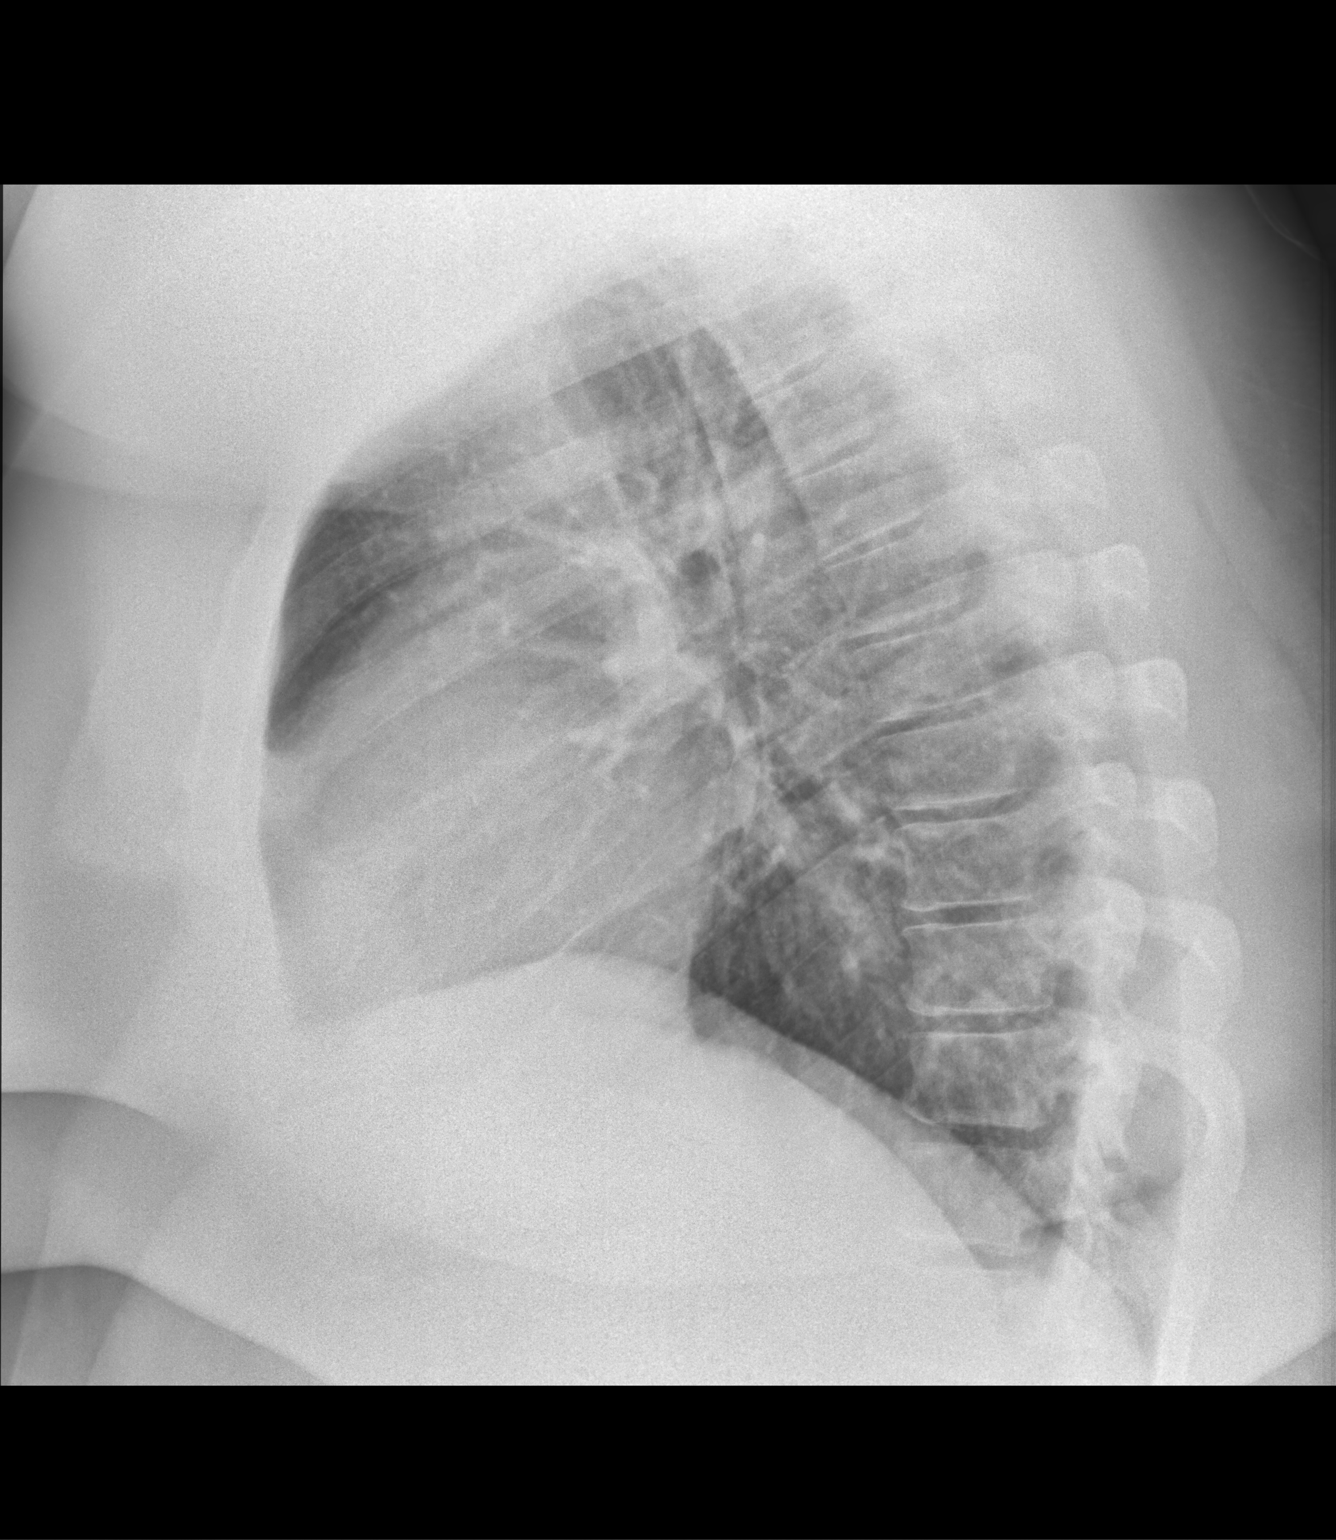

[2 of 2 positions shown; findings below may reference images not displayed]

FINDINGS: The heart size and mediastinal contours are within normal limits.
Both lungs are clear. The visualized skeletal structures are
unremarkable.
IMPRESSION: No active cardiopulmonary disease.

## 2018-12-20 MED ORDER — ALBUTEROL SULFATE HFA 108 (90 BASE) MCG/ACT IN AERS: 2.0000 | INHALATION_SPRAY | Freq: Four times a day (QID) | RESPIRATORY_TRACT | 0 refills | Status: DC | PRN

## 2018-12-20 NOTE — Patient Instructions (Signed)
Need to recheck cholesterol after you have been consistently exercising for 6 months, just email me.    I think you will need to see lung doctor. Let me know when you are ready to do this. If your CXR still shows this opacity, I will send you regardless.

## 2018-12-20 NOTE — Progress Notes (Signed)
Patient: Shawn [name] MRN: [id number] DOB: October 05, 1986 PCP: [name], MD     Subjective:  Chief Complaint  Patient presents with  . Follow-up    HPI: The patient is a 33 y.o. male who presents today for CXR follow up. Right upper opacity seen 10/22/18 after we did a CXR for a cough x 12 months. Treated for pneumonia and came back to see if resolved. He states his cough is better. He will go a couple of days and not cough at all. He has not associated symptoms and denies any symptoms. He feels like coughing seems worse afternoon through the night. He has been using the flonase and it helps. Cough is mainly dry in nature. Denies any symptoms of reflux or asthma history.   Hyperlipidemia: Quite elevated for his age. We are giving him 6 months to work on diet/exericse, but he likely is going to have to have knee surgery. He has gained 5 pounds.   Also wanted to discuss sleep apnea referral. He tells me that his girlfriend states he hasn't really snored that much or gasping for air.   Review of Systems  Constitutional: Negative for chills, fatigue and fever.  Respiratory: Positive for cough. Negative for shortness of breath.   Cardiovascular: Negative for chest pain.  Gastrointestinal: Negative for abdominal pain and nausea.  Neurological: Negative for dizziness and headaches.    Allergies Patient has No Known Allergies.  Past Medical History Patient  has a past medical history of Allergy.  Surgical History Patient  has no past surgical history on file.  Family History Pateint's family history includes Arthritis in his mother; Cataracts in his maternal grandfather; Diabetes in his maternal aunt and maternal uncle; Hypertension in his maternal aunt.  Social History Patient  reports that he has never smoked. He has never used smokeless tobacco. He reports current alcohol use. He reports that he does not use drugs.    Objective: Vitals:   12/20/18 1427  BP: 120/64   Pulse: 63  Temp: 98 F (36.7 C)  TempSrc: Oral  SpO2: 99%  Weight: (!) 395 lb 3.2 oz (179.3 kg)  Height: 5\' 11"  (1.803 m)    Body mass index is 55.12 kg/m.  Physical Exam Vitals signs reviewed.  Constitutional:      Appearance: He is obese.  HENT:     Right Ear: Tympanic membrane, ear canal and external ear normal.     Left Ear: Tympanic membrane, ear canal and external ear normal.  Neck:     Musculoskeletal: Normal range of motion and neck supple.  Cardiovascular:     Rate and Rhythm: Normal rate and regular rhythm.     Heart sounds: Normal heart sounds.  Pulmonary:     Effort: Pulmonary effort is normal. No respiratory distress.     Breath sounds: Normal breath sounds. No wheezing or rales.  Abdominal:     General: Abdomen is flat. Bowel sounds are normal.     Palpations: Abdomen is soft.  Lymphadenopathy:     Cervical: No cervical adenopathy.  Neurological:     Mental Status: He is alert.    CXR: I do not see opacity, but hard to see in first film. Waiting on official read.     Assessment/plan: 1. Cough Will wait for read on CXR. Still having cough despite using flonase. Denies any reflux symptoms. No other associated symptoms. Will do trial of albuterol inhaler prn. I think he needs to see pulm at this point for  chronic cough. He is going to get through all of his knee stuff then let me know but, but discussed if opacity still in lung will need to be referred now. Can also discuss sleep study with them.  - DG Chest 2 View  2. Mixed hyperlipidemia Has only gained weight. Can not exercise due to knee. Will hold off on labs until after he gets his knee fixed and really works on hard on diet and exercise.   3. BMI 50.0-59.9, adult (HCC) Have a plan in place. May need knee surgery and then can hopefully start really working out, although he doesn't seem to have changed much in his diet with weight gain.    Return if symptoms worsen or fail to improve.   [name], MD [facility]   12/20/2018

## 2018-12-25 DIAGNOSIS — M25561 Pain in right knee: Secondary | ICD-10-CM | POA: Diagnosis not present

## 2019-01-12 ENCOUNTER — Other Ambulatory Visit: Payer: Self-pay | Admitting: Family Medicine

## 2019-01-18 DIAGNOSIS — K080 Exfoliation of teeth due to systemic causes: Secondary | ICD-10-CM | POA: Diagnosis not present

## 2019-01-25 DIAGNOSIS — M545 Low back pain: Secondary | ICD-10-CM | POA: Diagnosis not present

## 2019-01-25 DIAGNOSIS — M9905 Segmental and somatic dysfunction of pelvic region: Secondary | ICD-10-CM | POA: Diagnosis not present

## 2019-01-25 DIAGNOSIS — M9904 Segmental and somatic dysfunction of sacral region: Secondary | ICD-10-CM | POA: Diagnosis not present

## 2019-01-25 DIAGNOSIS — M9903 Segmental and somatic dysfunction of lumbar region: Secondary | ICD-10-CM | POA: Diagnosis not present

## 2019-02-01 DIAGNOSIS — M9904 Segmental and somatic dysfunction of sacral region: Secondary | ICD-10-CM | POA: Diagnosis not present

## 2019-02-01 DIAGNOSIS — M545 Low back pain: Secondary | ICD-10-CM | POA: Diagnosis not present

## 2019-02-01 DIAGNOSIS — M9905 Segmental and somatic dysfunction of pelvic region: Secondary | ICD-10-CM | POA: Diagnosis not present

## 2019-02-01 DIAGNOSIS — M9903 Segmental and somatic dysfunction of lumbar region: Secondary | ICD-10-CM | POA: Diagnosis not present

## 2019-03-12 DIAGNOSIS — F4320 Adjustment disorder, unspecified: Secondary | ICD-10-CM | POA: Diagnosis not present

## 2019-03-20 DIAGNOSIS — F4320 Adjustment disorder, unspecified: Secondary | ICD-10-CM | POA: Diagnosis not present

## 2019-04-15 DIAGNOSIS — F4320 Adjustment disorder, unspecified: Secondary | ICD-10-CM | POA: Diagnosis not present

## 2019-04-29 DIAGNOSIS — F4320 Adjustment disorder, unspecified: Secondary | ICD-10-CM | POA: Diagnosis not present

## 2019-05-09 ENCOUNTER — Telehealth: Payer: Self-pay

## 2019-05-09 ENCOUNTER — Encounter: Payer: Self-pay | Admitting: Family Medicine

## 2019-05-09 NOTE — Telephone Encounter (Signed)
Due to possible covid - 88 sxs, he can schedule a virtual visit. If he is having urgent respiratory sxs, rec urgent care.

## 2019-05-09 NOTE — Telephone Encounter (Signed)
Spoke with patient and reviewed recommendations per Dr. Jonni Sanger, patient verbalizes understanding.  Appointment offered and accepted for doxy.me visit with Dr. Jonni Sanger 6/19 @3 :20 pm.

## 2019-05-09 NOTE — Telephone Encounter (Signed)
Spoke with patient.  He is c/o several days of dry cough, chest tightness, sore throat, fatigue.  Denies any fever, dizziness or headaches,  Reports pain in chest area due to cough radiating from chest area to mid back in between shoulder blades.  He reports slight sinus congestion which he has been using Flonase nasal spray as well as a generic OTC Nyquil prescribed for these symptoms which did help some but his cough and sore throat are his main complaints and is requesting an appointment.  Please advise.

## 2019-05-09 NOTE — Telephone Encounter (Signed)
Left detailed voicemail message on patient's cell phone requesting a c/b regarding MyChart message requesting an appointment.

## 2019-05-10 ENCOUNTER — Ambulatory Visit (INDEPENDENT_AMBULATORY_CARE_PROVIDER_SITE_OTHER): Payer: Federal, State, Local not specified - PPO | Admitting: Family Medicine

## 2019-05-10 ENCOUNTER — Encounter: Payer: Self-pay | Admitting: Family Medicine

## 2019-05-10 ENCOUNTER — Other Ambulatory Visit: Payer: Self-pay

## 2019-05-10 ENCOUNTER — Telehealth: Payer: Self-pay | Admitting: *Deleted

## 2019-05-10 VITALS — Temp 98.6°F

## 2019-05-10 DIAGNOSIS — Z20828 Contact with and (suspected) exposure to other viral communicable diseases: Secondary | ICD-10-CM | POA: Diagnosis not present

## 2019-05-10 DIAGNOSIS — Z20822 Contact with and (suspected) exposure to covid-19: Secondary | ICD-10-CM

## 2019-05-10 NOTE — Telephone Encounter (Signed)
Pt seen virtually, needing COVID testing.

## 2019-05-10 NOTE — Progress Notes (Signed)
     Virtual Visit via Video Note  Subjective  CC:  Chief Complaint  Patient presents with  . Sore Throat    Potential COVID exposure, unsure.. Started 1 week ago, taking cold/flu tabs and cough syrup.  . Cough     I connected with Randle Shatzer Schuessler on 05/10/19 at  3:20 PM EDT by a video enabled telemedicine application and verified that I am speaking with the correct person using two identifiers. Location patient: Home Location provider: [facility] [place], Office Persons participating in the virtual visit: [place], Leamon Arnt, MD Lilli Light, Leal discussed the limitations of evaluation and management by telemedicine and the availability of in person appointments. The patient expressed understanding and agreed to proceed. HPI: Shawn [name] is a 33 y.o. male who was contacted today to address the problems listed above in the chief complaint. . Dry cough with sore throat and "slight discomfort in the chest" and low back pain for about a week. sxs are stable and persistent. No f/c/s. No known covid exposures; girlfriend has had + covid exposures from work. She has no sxs. No myalgias, n/v/d, loss of smell of taste, but appetite is down.  Assessment  1. Exposure to Covid-19 Virus      Plan   Exposure to covid with sxs:  Set up for testing. Education and counseling done. Supportive care. mucinex dm, delsym advil. Monitor for fever or worsening cough/sob. If negative testing and cough becomes productive or sob, to UC for eval. Due to h/ pneumonia.  I discussed the assessment and treatment plan with the patient. The patient was provided an opportunity to ask questions and all were answered. The patient agreed with the plan and demonstrated an understanding of the instructions.   The patient was advised to call back or seek an in-person evaluation if the symptoms worsen or if the condition fails to improve as anticipated. Follow up: Return if  symptoms worsen or fail to improve.  Visit date not found  No orders of the defined types were placed in this encounter.     I reviewed the patients updated PMH, FH, and SocHx.    Patient Active Problem List   Diagnosis Date Noted  . Knee pain, bilateral 11/07/2018  . Osteoarthritis of knee 11/07/2018  . Hyperlipidemia 10/24/2018   Current Meds  Medication Sig  . diclofenac (VOLTAREN) 75 MG EC tablet Take 75 mg by mouth 2 (two) times daily.  . fluticasone (FLONASE) 50 MCG/ACT nasal spray Place 2 sprays into both nostrils daily.  Marland Kitchen ibuprofen (ADVIL,MOTRIN) 800 MG tablet Take 800 mg by mouth every 8 (eight) hours as needed.    Allergies: Patient has No Known Allergies. Family History: Patient family history includes Arthritis in his mother; Cataracts in his maternal grandfather; Diabetes in his maternal aunt and maternal uncle; Hypertension in his maternal aunt. Social History:  Patient  reports that he has never smoked. He has never used smokeless tobacco. He reports current alcohol use. He reports that he does not use drugs.  Review of Systems: Constitutional: Negative for fever malaise or anorexia Cardiovascular: negative for chest pain Respiratory: negative for SOB or persistent cough Gastrointestinal: negative for abdominal pain  OBJECTIVE Vitals: Temp 98.6 F (37 C) (Oral)  General: no acute distress , A&Ox3, hacking cough present. No respiratory distress  Leamon Arnt, MD

## 2019-05-13 ENCOUNTER — Other Ambulatory Visit: Payer: Federal, State, Local not specified - PPO

## 2019-05-13 ENCOUNTER — Telehealth: Payer: Self-pay

## 2019-05-13 DIAGNOSIS — R6889 Other general symptoms and signs: Secondary | ICD-10-CM | POA: Diagnosis not present

## 2019-05-13 DIAGNOSIS — Z20822 Contact with and (suspected) exposure to covid-19: Secondary | ICD-10-CM

## 2019-05-13 NOTE — Telephone Encounter (Signed)
Scheduled for today.

## 2019-05-13 NOTE — Telephone Encounter (Signed)
Patient called and advised of the request for covid testing, he verbalized understanding. Appointment scheduled for today at 1545 at [facility], advised of location and to wear a mask for everyone in the vehicle, he verbalized understanding.

## 2019-05-14 DIAGNOSIS — M545 Low back pain: Secondary | ICD-10-CM | POA: Diagnosis not present

## 2019-05-14 DIAGNOSIS — M9905 Segmental and somatic dysfunction of pelvic region: Secondary | ICD-10-CM | POA: Diagnosis not present

## 2019-05-14 DIAGNOSIS — M9904 Segmental and somatic dysfunction of sacral region: Secondary | ICD-10-CM | POA: Diagnosis not present

## 2019-05-14 DIAGNOSIS — M9903 Segmental and somatic dysfunction of lumbar region: Secondary | ICD-10-CM | POA: Diagnosis not present

## 2019-05-16 LAB — NOVEL CORONAVIRUS, NAA: SARS-CoV-2, NAA: NOT DETECTED

## 2019-05-22 DIAGNOSIS — M9905 Segmental and somatic dysfunction of pelvic region: Secondary | ICD-10-CM | POA: Diagnosis not present

## 2019-05-22 DIAGNOSIS — M9903 Segmental and somatic dysfunction of lumbar region: Secondary | ICD-10-CM | POA: Diagnosis not present

## 2019-05-22 DIAGNOSIS — M545 Low back pain: Secondary | ICD-10-CM | POA: Diagnosis not present

## 2019-05-22 DIAGNOSIS — M9904 Segmental and somatic dysfunction of sacral region: Secondary | ICD-10-CM | POA: Diagnosis not present

## 2019-05-28 DIAGNOSIS — M9904 Segmental and somatic dysfunction of sacral region: Secondary | ICD-10-CM | POA: Diagnosis not present

## 2019-05-28 DIAGNOSIS — M545 Low back pain: Secondary | ICD-10-CM | POA: Diagnosis not present

## 2019-05-28 DIAGNOSIS — M9903 Segmental and somatic dysfunction of lumbar region: Secondary | ICD-10-CM | POA: Diagnosis not present

## 2019-05-28 DIAGNOSIS — M9905 Segmental and somatic dysfunction of pelvic region: Secondary | ICD-10-CM | POA: Diagnosis not present

## 2019-06-10 DIAGNOSIS — F4320 Adjustment disorder, unspecified: Secondary | ICD-10-CM | POA: Diagnosis not present

## 2019-06-21 DIAGNOSIS — F4320 Adjustment disorder, unspecified: Secondary | ICD-10-CM | POA: Diagnosis not present

## 2019-07-19 DIAGNOSIS — F4320 Adjustment disorder, unspecified: Secondary | ICD-10-CM | POA: Diagnosis not present

## 2019-08-05 DIAGNOSIS — M545 Low back pain: Secondary | ICD-10-CM | POA: Diagnosis not present

## 2019-08-05 DIAGNOSIS — M9903 Segmental and somatic dysfunction of lumbar region: Secondary | ICD-10-CM | POA: Diagnosis not present

## 2019-08-05 DIAGNOSIS — M9905 Segmental and somatic dysfunction of pelvic region: Secondary | ICD-10-CM | POA: Diagnosis not present

## 2019-08-05 DIAGNOSIS — M9904 Segmental and somatic dysfunction of sacral region: Secondary | ICD-10-CM | POA: Diagnosis not present

## 2019-08-05 DIAGNOSIS — F4320 Adjustment disorder, unspecified: Secondary | ICD-10-CM | POA: Diagnosis not present

## 2019-08-30 DIAGNOSIS — F4320 Adjustment disorder, unspecified: Secondary | ICD-10-CM | POA: Diagnosis not present

## 2019-09-05 DIAGNOSIS — L819 Disorder of pigmentation, unspecified: Secondary | ICD-10-CM | POA: Diagnosis not present

## 2019-09-05 DIAGNOSIS — L564 Polymorphous light eruption: Secondary | ICD-10-CM | POA: Diagnosis not present

## 2019-09-05 DIAGNOSIS — L648 Other androgenic alopecia: Secondary | ICD-10-CM | POA: Diagnosis not present

## 2019-09-16 DIAGNOSIS — F4320 Adjustment disorder, unspecified: Secondary | ICD-10-CM | POA: Diagnosis not present

## 2019-11-08 DIAGNOSIS — F4320 Adjustment disorder, unspecified: Secondary | ICD-10-CM | POA: Diagnosis not present

## 2019-12-25 ENCOUNTER — Encounter: Payer: Self-pay | Admitting: Family Medicine

## 2020-01-01 ENCOUNTER — Ambulatory Visit: Payer: Federal, State, Local not specified - PPO | Attending: Internal Medicine

## 2020-01-01 DIAGNOSIS — Z20822 Contact with and (suspected) exposure to covid-19: Secondary | ICD-10-CM

## 2020-01-02 LAB — NOVEL CORONAVIRUS, NAA: SARS-CoV-2, NAA: NOT DETECTED

## 2020-02-04 ENCOUNTER — Observation Stay (HOSPITAL_COMMUNITY)
Admission: EM | Admit: 2020-02-04 | Discharge: 2020-02-06 | Disposition: A | Discharge status: Home / Self Care | Payer: Federal, State, Local not specified - PPO | Attending: General Surgery | Admitting: General Surgery

## 2020-02-04 ENCOUNTER — Emergency Department (HOSPITAL_COMMUNITY): Payer: Federal, State, Local not specified - PPO

## 2020-02-04 ENCOUNTER — Encounter (HOSPITAL_COMMUNITY): Payer: Self-pay

## 2020-02-04 DIAGNOSIS — S3992XA Unspecified injury of lower back, initial encounter: Secondary | ICD-10-CM | POA: Diagnosis not present

## 2020-02-04 DIAGNOSIS — Z20822 Contact with and (suspected) exposure to covid-19: Secondary | ICD-10-CM | POA: Diagnosis not present

## 2020-02-04 DIAGNOSIS — R52 Pain, unspecified: Secondary | ICD-10-CM | POA: Diagnosis not present

## 2020-02-04 DIAGNOSIS — R000 Tachycardia, unspecified: Secondary | ICD-10-CM | POA: Diagnosis not present

## 2020-02-04 DIAGNOSIS — T1490XA Injury, unspecified, initial encounter: Secondary | ICD-10-CM

## 2020-02-04 DIAGNOSIS — S199XXA Unspecified injury of neck, initial encounter: Secondary | ICD-10-CM | POA: Diagnosis not present

## 2020-02-04 DIAGNOSIS — Z79899 Other long term (current) drug therapy: Secondary | ICD-10-CM | POA: Insufficient documentation

## 2020-02-04 DIAGNOSIS — M170 Bilateral primary osteoarthritis of knee: Secondary | ICD-10-CM | POA: Insufficient documentation

## 2020-02-04 DIAGNOSIS — S0990XA Unspecified injury of head, initial encounter: Secondary | ICD-10-CM | POA: Diagnosis not present

## 2020-02-04 DIAGNOSIS — I10 Essential (primary) hypertension: Secondary | ICD-10-CM | POA: Insufficient documentation

## 2020-02-04 DIAGNOSIS — R0902 Hypoxemia: Secondary | ICD-10-CM | POA: Diagnosis not present

## 2020-02-04 DIAGNOSIS — E785 Hyperlipidemia, unspecified: Secondary | ICD-10-CM | POA: Diagnosis not present

## 2020-02-04 DIAGNOSIS — S2242XA Multiple fractures of ribs, left side, initial encounter for closed fracture: Secondary | ICD-10-CM | POA: Diagnosis not present

## 2020-02-04 DIAGNOSIS — Y92410 Unspecified street and highway as the place of occurrence of the external cause: Secondary | ICD-10-CM | POA: Insufficient documentation

## 2020-02-04 DIAGNOSIS — S2232XA Fracture of one rib, left side, initial encounter for closed fracture: Secondary | ICD-10-CM | POA: Diagnosis not present

## 2020-02-04 DIAGNOSIS — S299XXA Unspecified injury of thorax, initial encounter: Secondary | ICD-10-CM | POA: Diagnosis not present

## 2020-02-04 DIAGNOSIS — R41841 Cognitive communication deficit: Secondary | ICD-10-CM | POA: Diagnosis not present

## 2020-02-04 DIAGNOSIS — M545 Low back pain: Secondary | ICD-10-CM | POA: Diagnosis not present

## 2020-02-04 DIAGNOSIS — Z8249 Family history of ischemic heart disease and other diseases of the circulatory system: Secondary | ICD-10-CM | POA: Insufficient documentation

## 2020-02-04 DIAGNOSIS — S060X0A Concussion without loss of consciousness, initial encounter: Secondary | ICD-10-CM | POA: Diagnosis not present

## 2020-02-04 DIAGNOSIS — S3991XA Unspecified injury of abdomen, initial encounter: Secondary | ICD-10-CM | POA: Diagnosis not present

## 2020-02-04 HISTORY — DX: Multiple fractures of ribs, left side, initial encounter for closed fracture: S22.42XA

## 2020-02-04 HISTORY — DX: Essential (primary) hypertension: I10

## 2020-02-04 LAB — CBC
HCT: 44.4 % (ref 39.0–52.0)
Hemoglobin: 15 g/dL (ref 13.0–17.0)
MCH: 27.6 pg (ref 26.0–34.0)
MCHC: 33.8 g/dL (ref 30.0–36.0)
MCV: 81.8 fL (ref 80.0–100.0)
Platelets: 439 10*3/uL — ABNORMAL HIGH (ref 150–400)
RBC: 5.43 MIL/uL (ref 4.22–5.81)
RDW: 12.6 % (ref 11.5–15.5)
WBC: 14.1 10*3/uL — ABNORMAL HIGH (ref 4.0–10.5)
nRBC: 0 % (ref 0.0–0.2)

## 2020-02-04 LAB — PROTIME-INR
INR: 1 (ref 0.8–1.2)
Prothrombin Time: 13 seconds (ref 11.4–15.2)

## 2020-02-04 LAB — LACTIC ACID, PLASMA: Lactic Acid, Venous: 2.4 mmol/L (ref 0.5–1.9)

## 2020-02-04 LAB — COMPREHENSIVE METABOLIC PANEL
ALT: 18 U/L (ref 0–44)
AST: 31 U/L (ref 15–41)
Albumin: 3.8 g/dL (ref 3.5–5.0)
Alkaline Phosphatase: 80 U/L (ref 38–126)
Anion gap: 9 (ref 5–15)
BUN: 14 mg/dL (ref 6–20)
CO2: 25 mmol/L (ref 22–32)
Calcium: 8.2 mg/dL — ABNORMAL LOW (ref 8.9–10.3)
Chloride: 103 mmol/L (ref 98–111)
Creatinine, Ser: 1.17 mg/dL (ref 0.61–1.24)
GFR calc Af Amer: >60 mL/min (ref >60)
GFR calc non Af Amer: >60 mL/min (ref >60)
Glucose, Bld: 133 mg/dL — ABNORMAL HIGH (ref 70–99)
Potassium: 3.4 mmol/L — ABNORMAL LOW (ref 3.5–5.1)
Sodium: 137 mmol/L (ref 135–145)
Total Bilirubin: 0.7 mg/dL (ref 0.3–1.2)
Total Protein: 7.2 g/dL (ref 6.5–8.1)

## 2020-02-04 LAB — I-STAT CHEM 8, ED
BUN: 16 mg/dL (ref 6–20)
Calcium, Ion: 1.09 mmol/L — ABNORMAL LOW (ref 1.15–1.40)
Chloride: 101 mmol/L (ref 98–111)
Creatinine, Ser: 1.1 mg/dL (ref 0.61–1.24)
Glucose, Bld: 125 mg/dL — ABNORMAL HIGH (ref 70–99)
HCT: 44 % (ref 39.0–52.0)
Hemoglobin: 15 g/dL (ref 13.0–17.0)
Potassium: 3.3 mmol/L — ABNORMAL LOW (ref 3.5–5.1)
Sodium: 139 mmol/L (ref 135–145)
TCO2: 26 mmol/L (ref 22–32)

## 2020-02-04 LAB — ETHANOL: Alcohol, Ethyl (B): <10 mg/dL (ref <10)
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
## 2020-02-04 MED ORDER — SODIUM CHLORIDE 0.9 % IV BOLUS
1000.0000 mL | Freq: Once | INTRAVENOUS | Status: AC
  Administered 2020-02-04: 1000 mL via INTRAVENOUS

## 2020-02-04 MED ORDER — FENTANYL CITRATE (PF) 100 MCG/2ML IJ SOLN
INTRAMUSCULAR | Status: AC
  Administered 2020-02-04: 100 ug via INTRAVENOUS
  Filled 2020-02-04: qty 2

## 2020-02-04 MED ORDER — HYDROMORPHONE HCL 1 MG/ML IJ SOLN
1.0000 mg | Freq: Once | INTRAMUSCULAR | Status: AC
  Administered 2020-02-04: 1 mg via INTRAVENOUS
  Filled 2020-02-04: qty 1

## 2020-02-04 MED ORDER — FENTANYL CITRATE (PF) 100 MCG/2ML IJ SOLN: 100.0000 ug | Freq: Once | INTRAMUSCULAR | Status: AC

## 2020-02-04 MED ORDER — IOHEXOL 300 MG/ML  SOLN
100.0000 mL | Freq: Once | INTRAMUSCULAR | Status: AC | PRN
  Administered 2020-02-04: 100 mL via INTRAVENOUS

## 2020-02-04 NOTE — Progress Notes (Signed)
Orthopedic Tech Progress Note Patient Details:  BERT PTACEK 1986-01-16 [id number] TRAUMA LEVEL 2 Patient ID: YEIDEN FRENKEL, male   DOB: [birth year], 34 y.o.   MRN: [id number]   Ancil Linsey 02/04/2020, 7:53 PM
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
## 2020-02-04 NOTE — ED Notes (Signed)
Patient transported to CT 
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
## 2020-02-04 NOTE — ED Notes (Signed)
[phone] pts wife Sheralyn Boatman would like an update when available

## 2020-02-04 NOTE — ED Notes (Signed)
Pt returned from CT °

## 2020-02-05 DIAGNOSIS — S060X0A Concussion without loss of consciousness, initial encounter: Secondary | ICD-10-CM | POA: Diagnosis not present

## 2020-02-05 DIAGNOSIS — R030 Elevated blood-pressure reading, without diagnosis of hypertension: Secondary | ICD-10-CM | POA: Diagnosis not present

## 2020-02-05 DIAGNOSIS — S2242XA Multiple fractures of ribs, left side, initial encounter for closed fracture: Secondary | ICD-10-CM | POA: Diagnosis not present

## 2020-02-05 LAB — CBC
HCT: 44.4 % (ref 39.0–52.0)
Hemoglobin: 14.9 g/dL (ref 13.0–17.0)
MCH: 27.4 pg (ref 26.0–34.0)
MCHC: 33.6 g/dL (ref 30.0–36.0)
MCV: 81.6 fL (ref 80.0–100.0)
Platelets: 371 10*3/uL (ref 150–400)
RBC: 5.44 MIL/uL (ref 4.22–5.81)
RDW: 12.9 % (ref 11.5–15.5)
WBC: 15.2 10*3/uL — ABNORMAL HIGH (ref 4.0–10.5)
nRBC: 0 % (ref 0.0–0.2)

## 2020-02-05 LAB — HIV ANTIBODY (ROUTINE TESTING W REFLEX): HIV Screen 4th Generation wRfx: NONREACTIVE

## 2020-02-05 LAB — SARS CORONAVIRUS 2 (TAT 6-24 HRS): SARS Coronavirus 2: NEGATIVE

## 2020-02-05 MED ORDER — KETOROLAC TROMETHAMINE 30 MG/ML IJ SOLN
30.0000 mg | Freq: Three times a day (TID) | INTRAMUSCULAR | Status: DC
  Administered 2020-02-05 – 2020-02-06 (×4): 30 mg via INTRAVENOUS
  Filled 2020-02-05 (×4): qty 1

## 2020-02-05 MED ORDER — METOPROLOL TARTRATE 5 MG/5ML IV SOLN: 5.0000 mg | Freq: Four times a day (QID) | INTRAVENOUS | Status: DC | PRN

## 2020-02-05 MED ORDER — ALBUTEROL SULFATE (2.5 MG/3ML) 0.083% IN NEBU: 3.0000 mL | INHALATION_SOLUTION | Freq: Four times a day (QID) | RESPIRATORY_TRACT | Status: DC | PRN

## 2020-02-05 MED ORDER — METOPROLOL TARTRATE 5 MG/5ML IV SOLN
5.0000 mg | Freq: Four times a day (QID) | INTRAVENOUS | Status: DC | PRN
  Administered 2020-02-05 – 2020-02-06 (×2): 5 mg via INTRAVENOUS
  Filled 2020-02-05 (×2): qty 5

## 2020-02-05 MED ORDER — ENOXAPARIN SODIUM 40 MG/0.4ML ~~LOC~~ SOLN
40.0000 mg | SUBCUTANEOUS | Status: DC
  Administered 2020-02-05 – 2020-02-06 (×2): 40 mg via SUBCUTANEOUS
  Filled 2020-02-05 (×2): qty 0.4

## 2020-02-05 MED ORDER — LIDOCAINE 5 % EX PTCH
1.0000 | MEDICATED_PATCH | CUTANEOUS | Status: DC
  Administered 2020-02-05 – 2020-02-06 (×2): 1 via TRANSDERMAL
  Filled 2020-02-05 (×4): qty 1

## 2020-02-05 MED ORDER — HYDROMORPHONE HCL 1 MG/ML IJ SOLN
0.5000 mg | INTRAMUSCULAR | Status: DC | PRN
  Administered 2020-02-06: 0.5 mg via INTRAVENOUS
  Filled 2020-02-05: qty 1

## 2020-02-05 MED ORDER — POTASSIUM CHLORIDE IN NACL 20-0.9 MEQ/L-% IV SOLN
INTRAVENOUS | Status: DC
  Filled 2020-02-05 (×2): qty 1000

## 2020-02-05 MED ORDER — HYDROMORPHONE HCL 1 MG/ML IJ SOLN
INTRAMUSCULAR | Status: AC
  Filled 2020-02-05: qty 1

## 2020-02-05 MED ORDER — ONDANSETRON HCL 4 MG/2ML IJ SOLN: 4.0000 mg | Freq: Four times a day (QID) | INTRAMUSCULAR | Status: DC | PRN

## 2020-02-05 MED ORDER — OXYCODONE HCL 5 MG PO TABS
5.0000 mg | ORAL_TABLET | ORAL | Status: DC | PRN
  Administered 2020-02-06: 5 mg via ORAL
  Filled 2020-02-05: qty 1

## 2020-02-05 MED ORDER — ONDANSETRON 4 MG PO TBDP: 4.0000 mg | ORAL_TABLET | Freq: Four times a day (QID) | ORAL | Status: DC | PRN

## 2020-02-05 MED ORDER — ACETAMINOPHEN 500 MG PO TABS
1000.0000 mg | ORAL_TABLET | Freq: Four times a day (QID) | ORAL | Status: DC
  Administered 2020-02-05 – 2020-02-06 (×7): 1000 mg via ORAL
  Filled 2020-02-05 (×7): qty 2

## 2020-02-05 MED ORDER — METHOCARBAMOL 750 MG PO TABS
750.0000 mg | ORAL_TABLET | Freq: Three times a day (TID) | ORAL | Status: DC | PRN
  Administered 2020-02-05 – 2020-02-06 (×2): 750 mg via ORAL
  Filled 2020-02-05 (×2): qty 1

## 2020-02-05 MED ORDER — OXYCODONE HCL 5 MG PO TABS
10.0000 mg | ORAL_TABLET | ORAL | Status: DC | PRN
  Administered 2020-02-05 – 2020-02-06 (×3): 10 mg via ORAL
  Filled 2020-02-05 (×3): qty 2

## 2020-02-05 MED ORDER — HYDROMORPHONE HCL 1 MG/ML IJ SOLN
1.0000 mg | INTRAMUSCULAR | Status: DC | PRN
  Administered 2020-02-05 (×3): 1 mg via INTRAVENOUS
  Filled 2020-02-05 (×2): qty 1
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
## 2020-02-05 NOTE — Plan of Care (Signed)
  Problem: Education: Goal: Knowledge of General Education information will improve Description Including pain rating scale, medication(s)/side effects and non-pharmacologic comfort measures Outcome: Progressing   
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
## 2020-02-05 NOTE — Plan of Care (Signed)
°  Problem: Education: °Goal: Knowledge of General Education information will improve °Description: Including pain rating scale, medication(s)/side effects and non-pharmacologic comfort measures °Outcome: Progressing °  °Problem: Health Behavior/Discharge Planning: °Goal: Ability to manage health-related needs will improve °Outcome: Progressing °  °Problem: Clinical Measurements: °Goal: Ability to maintain clinical measurements within normal limits will improve °Outcome: Progressing °Goal: Respiratory complications will improve °Outcome: Progressing °  °Problem: Activity: °Goal: Risk for activity intolerance will decrease °Outcome: Progressing °  °Problem: Pain Managment: °Goal: General experience of comfort will improve °Outcome: Progressing °  °Problem: Safety: °Goal: Ability to remain free from injury will improve °Outcome: Progressing °  °Problem: Skin Integrity: °Goal: Risk for impaired skin integrity will decrease °Outcome: Progressing °  °

## 2020-02-05 NOTE — ED Notes (Signed)
This RN tried to call report twice without success.
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
## 2020-02-06 DIAGNOSIS — R030 Elevated blood-pressure reading, without diagnosis of hypertension: Secondary | ICD-10-CM | POA: Diagnosis not present

## 2020-02-06 DIAGNOSIS — S060X0A Concussion without loss of consciousness, initial encounter: Secondary | ICD-10-CM | POA: Diagnosis not present

## 2020-02-06 DIAGNOSIS — S2242XA Multiple fractures of ribs, left side, initial encounter for closed fracture: Secondary | ICD-10-CM | POA: Diagnosis not present

## 2020-02-06 LAB — BASIC METABOLIC PANEL
Anion gap: 8 (ref 5–15)
BUN: 13 mg/dL (ref 6–20)
CO2: 26 mmol/L (ref 22–32)
Calcium: 8.3 mg/dL — ABNORMAL LOW (ref 8.9–10.3)
Chloride: 106 mmol/L (ref 98–111)
Creatinine, Ser: 1.14 mg/dL (ref 0.61–1.24)
GFR calc Af Amer: >60 mL/min (ref >60)
GFR calc non Af Amer: >60 mL/min (ref >60)
Glucose, Bld: 101 mg/dL — ABNORMAL HIGH (ref 70–99)
Potassium: 3.8 mmol/L (ref 3.5–5.1)
Sodium: 140 mmol/L (ref 135–145)

## 2020-02-06 MED ORDER — OXYCODONE HCL 10 MG PO TABS: 5.0000 mg | ORAL_TABLET | Freq: Four times a day (QID) | ORAL | 0 refills | Status: DC | PRN

## 2020-02-06 MED ORDER — ACETAMINOPHEN 500 MG PO TABS: 1000.0000 mg | ORAL_TABLET | Freq: Four times a day (QID) | ORAL | 0 refills | Status: DC

## 2020-02-06 MED ORDER — METHOCARBAMOL 750 MG PO TABS: 750.0000 mg | ORAL_TABLET | Freq: Three times a day (TID) | ORAL | 0 refills | Status: DC | PRN

## 2020-02-06 NOTE — Progress Notes (Signed)
Discharged home today. Personal belongings, bariatric bedside commode,walker,instructions given to patient. Advised to pick up medications called in to pharmacy of choice. Verbalized understanding of instructions
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
## 2020-02-06 NOTE — Progress Notes (Signed)
ADDENDUM to DC summary:  Patient had elevated BP while present and was treated with prn medications.  This was initially felt to be secondary to his pain.  However, it came down some, but was still elevated and then went back up.  He is encouraged to follow up with his PCP for his elevated BP.  Letha Cape 11:27 AM 02/06/2020

## 2020-02-06 NOTE — TOC Transition Note (Signed)
Transition of Care [facility]) - CM/SW Discharge Note   Patient Details  Name: BENJAMIN CASANAS MRN: [id number] Date of Birth: February 21, 1986  [facility] CM/SW Contact:  Glennon Mac, RN Phone Number: 02/06/2020, 4:01 PM   Clinical Narrative:  Pt medically stable for discharge home today with girlfriend/family to assist.  Referral to [employer] [facility] for bariatric 3 in 1, to be delivered to pt's room prior to dc.  No other needs identified.   Notified at 3:00pm that pt now has RW recommended for dc by PT.  Referral to [employer] [facility] for RW, to be delivered to bedside prior to dc.      Final next level of care: Home/Self Care Barriers to Discharge: Barriers Resolved   Patient Goals and CMS Choice        Discharge Placement                       Discharge Plan and Services   Discharge Planning Services: CM Consult            DME Arranged: 3-N-1, Walker rolling DME Agency: [employer] Date DME Agency Contacted: 02/06/20 Time DME Agency Contacted: [phone] Representative spoke with at DME Agency: Oletha Cruel            Social Determinants of Health (SDOH) Interventions     Readmission Risk Interventions No flowsheet data found.  Quintella Baton, RN, BSN  Trauma/Neuro ICU Case Manager [phone]
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
## 2020-02-06 NOTE — Discharge Instructions (Signed)
Rib Fracture  A rib fracture is a break or crack in one of the bones of the ribs. The ribs are like a cage that goes around your upper chest. A broken or cracked rib is often painful, but most do not cause other problems. Most rib fractures usually heal on their own in 1-3 months. Follow these instructions at home: Managing pain, stiffness, and swelling  If directed, apply ice to the injured area. ? Put ice in a plastic bag. ? Place a towel between your skin and the bag. ? Leave the ice on for 20 minutes, 2-3 times a day.  Take over-the-counter and prescription medicines only as told by your doctor. Activity  Avoid activities that cause pain to the injured area. Protect your injured area.  Slowly increase activity as told by your doctor. General instructions  Do deep breathing as told by your doctor. You may be told to: ? Take deep breaths many times a day. ? Cough many times a day while hugging a pillow. ? Use a device (incentive spirometer) to do deep breathing many times a day.  Drink enough fluid to keep your pee (urine) clear or pale yellow.  Do not wear a rib belt or binder. These do not allow you to breathe deeply.  Keep all follow-up visits as told by your doctor. This is important. Contact a doctor if:  You have a fever. Get help right away if:  You have trouble breathing.  You are short of breath.  You cannot stop coughing.  You cough up thick or bloody spit (sputum).  You feel sick to your stomach (nauseous), throw up (vomit), or have belly (abdominal) pain.  Your pain gets worse and medicine does not help. Summary  A rib fracture is a break or crack in one of the bones of the ribs.  Apply ice to the injured area and take medicines for pain as told by your doctor.  Take deep breaths and cough many times a day. Hug a pillow every time you cough. This information is not intended to replace advice given to you by your health care provider. Make sure you  discuss any questions you have with your health care provider. Document Revised: 10/20/2017 Document Reviewed: 02/07/2017 Elsevier Patient Education  2020 [employer].   Hypertension, Adult Hypertension is another name for high blood pressure. High blood pressure forces your heart to work harder to pump blood. This can cause problems over time. There are two numbers in a blood pressure reading. There is a top number (systolic) over a bottom number (diastolic). It is best to have a blood pressure that is below 120/80. Healthy choices can help lower your blood pressure, or you may need medicine to help lower it. What are the causes? The cause of this condition is not known. Some conditions may be related to high blood pressure. What increases the risk?  Smoking.  Having type 2 diabetes mellitus, high cholesterol, or both.  Not getting enough exercise or physical activity.  Being overweight.  Having too much fat, sugar, calories, or salt (sodium) in your diet.  Drinking too much alcohol.  Having long-term (chronic) kidney disease.  Having a family history of high blood pressure.  Age. Risk increases with age.  Race. You may be at higher risk if you are African American.  Gender. Men are at higher risk than women before age 36. After age 101, women are at higher risk than men.  Having obstructive sleep apnea.  Stress.  What are the signs or symptoms?  High blood pressure may not cause symptoms. Very high blood pressure (hypertensive crisis) may cause: ? Headache. ? Feelings of worry or nervousness (anxiety). ? Shortness of breath. ? Nosebleed. ? A feeling of being sick to your stomach (nausea). ? Throwing up (vomiting). ? Changes in how you see. ? Very bad chest pain. ? Seizures. How is this treated?  This condition is treated by making healthy lifestyle changes, such as: ? Eating healthy foods. ? Exercising more. ? Drinking less alcohol.  Your health care provider  may prescribe medicine if lifestyle changes are not enough to get your blood pressure under control, and if: ? Your top number is above 130. ? Your bottom number is above 80.  Your personal target blood pressure may vary. Follow these instructions at home: Eating and drinking   If told, follow the DASH eating plan. To follow this plan: ? Fill one half of your plate at each meal with fruits and vegetables. ? Fill one fourth of your plate at each meal with whole grains. Whole grains include whole-wheat pasta, brown rice, and whole-grain bread. ? Eat or drink low-fat dairy products, such as skim milk or low-fat yogurt. ? Fill one fourth of your plate at each meal with low-fat (lean) proteins. Low-fat proteins include fish, chicken without skin, eggs, beans, and tofu. ? Avoid fatty meat, cured and processed meat, or chicken with skin. ? Avoid pre-made or processed food.  Eat less than 1,500 mg of salt each day.  Do not drink alcohol if: ? Your doctor tells you not to drink. ? You are pregnant, may be pregnant, or are planning to become pregnant.  If you drink alcohol: ? Limit how much you use to:  0-1 drink a day for women.  0-2 drinks a day for men. ? Be aware of how much alcohol is in your drink. In the [place] one drink equals one 12 oz bottle of beer (355 mL), one 5 oz glass of wine (148 mL), or one 1 oz glass of hard liquor (44 mL). Lifestyle   Work with your doctor to stay at a healthy weight or to lose weight. Ask your doctor what the best weight is for you.  Get at least 30 minutes of exercise most days of the week. This may include walking, swimming, or biking.  Get at least 30 minutes of exercise that strengthens your muscles (resistance exercise) at least 3 days a week. This may include lifting weights or doing Pilates.  Do not use any products that contain nicotine or tobacco, such as cigarettes, e-cigarettes, and chewing tobacco. If you need help quitting, ask your  doctor.  Check your blood pressure at home as told by your doctor.  Keep all follow-up visits as told by your doctor. This is important. Medicines  Take over-the-counter and prescription medicines only as told by your doctor. Follow directions carefully.  Do not skip doses of blood pressure medicine. The medicine does not work as well if you skip doses. Skipping doses also puts you at risk for problems.  Ask your doctor about side effects or reactions to medicines that you should watch for. Contact a doctor if you:  Think you are having a reaction to the medicine you are taking.  Have headaches that keep coming back (recurring).  Feel dizzy.  Have swelling in your ankles.  Have trouble with your vision. Get help right away if you:  Get a very bad headache.  Start to feel mixed up (confused).  Feel weak or numb.  Feel faint.  Have very bad pain in your: ? Chest. ? Belly (abdomen).  Throw up more than once.  Have trouble breathing. Summary  Hypertension is another name for high blood pressure.  High blood pressure forces your heart to work harder to pump blood.  For most people, a normal blood pressure is less than 120/80.  Making healthy choices can help lower blood pressure. If your blood pressure does not get lower with healthy choices, you may need to take medicine. This information is not intended to replace advice given to you by your health care provider. Make sure you discuss any questions you have with your health care provider. Document Revised: 07/18/2018 Document Reviewed: 07/18/2018 Elsevier Patient Education  2020 [employer].
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
## 2020-02-06 NOTE — TOC Transition Note (Signed)
Transition of Care [facility]) - CM/SW Discharge Note   Patient Details  Name: ARCENIO MULLALY MRN: [id number] Date of Birth: 1986/11/20  [facility] CM/SW Contact:  Glennon Mac, RN Phone Number: 02/06/2020, 9:53 AM   Clinical Narrative:   Pt medically stable for discharge home today with girlfriend/family to assist.  Referral to [facility] for bariatric 3 in 1, to be delivered to pt's room prior to dc.  No other needs identified.     Final next level of care: Home/Self Care Barriers to Discharge: Continued Medical Work up                       Discharge Plan and Services                                     Social Determinants of Health (SDOH) Interventions     Readmission Risk Interventions No flowsheet data found.  Quintella Baton, RN, BSN  Trauma/Neuro ICU Case Manager [phone]

## 2020-02-12 ENCOUNTER — Encounter: Payer: Self-pay | Admitting: Family Medicine

## 2020-02-12 ENCOUNTER — Ambulatory Visit (INDEPENDENT_AMBULATORY_CARE_PROVIDER_SITE_OTHER): Payer: Federal, State, Local not specified - PPO | Admitting: Family Medicine

## 2020-02-12 ENCOUNTER — Other Ambulatory Visit: Payer: Self-pay

## 2020-02-12 VITALS — BP 158/118 | HR 86 | Temp 98.8°F | Ht 71.0 in | Wt 386.8 lb

## 2020-02-12 DIAGNOSIS — R531 Weakness: Secondary | ICD-10-CM

## 2020-02-12 DIAGNOSIS — M545 Low back pain, unspecified: Secondary | ICD-10-CM

## 2020-02-12 DIAGNOSIS — F39 Unspecified mood [affective] disorder: Secondary | ICD-10-CM | POA: Diagnosis not present

## 2020-02-12 DIAGNOSIS — S2242XA Multiple fractures of ribs, left side, initial encounter for closed fracture: Secondary | ICD-10-CM

## 2020-02-12 MED ORDER — KETOROLAC TROMETHAMINE 60 MG/2ML IM SOLN
60.0000 mg | Freq: Once | INTRAMUSCULAR | Status: AC
  Administered 2020-02-12: 60 mg via INTRAMUSCULAR

## 2020-02-12 MED ORDER — IBUPROFEN 800 MG PO TABS: 800.0000 mg | ORAL_TABLET | Freq: Three times a day (TID) | ORAL | 0 refills | Status: DC | PRN

## 2020-02-12 MED ORDER — TRAZODONE HCL 50 MG PO TABS: 25.0000 mg | ORAL_TABLET | Freq: Every evening | ORAL | 3 refills | Status: DC | PRN

## 2020-02-12 NOTE — Patient Instructions (Signed)
1) for sleep we can trial trazodone. Take 1/2 to one pill about 30 minutes to one hour before you go to bed.   2) ibuprofen 800mg  sent in for pain. Can take one pill every 8 hours as needed. Do not want you on this long term. Wait 8 hours from your shot today.   3) PT done and they will call you with this.   4) watch your terrors/possible ptsd and depression. We may need to start medication.   5) if pain is unbearable in your ribs, we can do blocks by IR where they do medication by nerve of rib. Just let me know.   6) this takes time and pain control. Give yourself some time!  7) watch your back pain and let me know if not getting any better.    Hang in there,  Dr. 
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
## 2020-02-16 ENCOUNTER — Encounter: Payer: Self-pay | Admitting: Family Medicine

## 2020-02-17 ENCOUNTER — Other Ambulatory Visit: Payer: Self-pay

## 2020-02-17 ENCOUNTER — Telehealth: Payer: Self-pay

## 2020-02-17 MED ORDER — ALBUTEROL SULFATE HFA 108 (90 BASE) MCG/ACT IN AERS: INHALATION_SPRAY | RESPIRATORY_TRACT | 0 refills | Status: DC

## 2020-02-17 NOTE — Telephone Encounter (Signed)
I have sent in rx refill for inhaler.  Please Advise.
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
## 2020-02-19 ENCOUNTER — Other Ambulatory Visit: Payer: Self-pay | Admitting: Family Medicine

## 2020-02-19 ENCOUNTER — Ambulatory Visit (INDEPENDENT_AMBULATORY_CARE_PROVIDER_SITE_OTHER): Payer: Federal, State, Local not specified - PPO | Admitting: Physical Therapy

## 2020-02-19 ENCOUNTER — Encounter: Payer: Self-pay | Admitting: Physical Therapy

## 2020-02-19 ENCOUNTER — Other Ambulatory Visit: Payer: Self-pay

## 2020-02-19 DIAGNOSIS — M25512 Pain in left shoulder: Secondary | ICD-10-CM | POA: Diagnosis not present

## 2020-02-19 DIAGNOSIS — M546 Pain in thoracic spine: Secondary | ICD-10-CM | POA: Diagnosis not present

## 2020-02-19 DIAGNOSIS — M545 Low back pain, unspecified: Secondary | ICD-10-CM

## 2020-02-19 MED ORDER — METHOCARBAMOL 750 MG PO TABS: 750.0000 mg | ORAL_TABLET | Freq: Three times a day (TID) | ORAL | 0 refills | Status: DC | PRN

## 2020-02-19 NOTE — Telephone Encounter (Signed)
Note given today at his PT appointment.  [name], MD [facility]

## 2020-02-19 NOTE — Telephone Encounter (Signed)
LVM for patient to call the office back. 

## 2020-02-19 NOTE — Patient Instructions (Signed)
Access Code: [email or web address] URL: [email or web address] Date: 02/19/2020 Prepared by: Sedalia Muta  Exercises Supine Shoulder Flexion AAROM - 2 x daily - 1 sets - 10 reps Supine Posterior Pelvic Tilt - 2 x daily - 2 sets - 10 reps Supine Single Knee to Chest Stretch - 2 x daily - 3 reps - 30 hold

## 2020-02-19 NOTE — Telephone Encounter (Signed)
I did give him a sleep medication called trazodone. I will not give anything else besides this. I will extend work note through all of next week. Do we need to put that he needs to do telehealth or work from home?  I refilled muscle relaxer.  Dr. Artis Flock

## 2020-02-22 ENCOUNTER — Encounter: Payer: Self-pay | Admitting: Physical Therapy
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
## 2020-02-26 ENCOUNTER — Encounter (HOSPITAL_COMMUNITY): Payer: Self-pay | Admitting: Emergency Medicine

## 2020-02-26 ENCOUNTER — Other Ambulatory Visit: Payer: Self-pay

## 2020-02-26 ENCOUNTER — Emergency Department (HOSPITAL_COMMUNITY): Payer: Federal, State, Local not specified - PPO

## 2020-02-26 ENCOUNTER — Ambulatory Visit (INDEPENDENT_AMBULATORY_CARE_PROVIDER_SITE_OTHER): Payer: Federal, State, Local not specified - PPO | Admitting: Physical Therapy

## 2020-02-26 ENCOUNTER — Encounter: Payer: Federal, State, Local not specified - PPO | Admitting: Physical Therapy

## 2020-02-26 ENCOUNTER — Emergency Department (HOSPITAL_COMMUNITY)
Admission: EM | Admit: 2020-02-26 | Discharge: 2020-02-26 | Disposition: A | Discharge status: Home / Self Care | Payer: Federal, State, Local not specified - PPO | Attending: Emergency Medicine | Admitting: Emergency Medicine

## 2020-02-26 DIAGNOSIS — M25512 Pain in left shoulder: Secondary | ICD-10-CM

## 2020-02-26 DIAGNOSIS — I10 Essential (primary) hypertension: Secondary | ICD-10-CM | POA: Insufficient documentation

## 2020-02-26 DIAGNOSIS — S299XXA Unspecified injury of thorax, initial encounter: Secondary | ICD-10-CM | POA: Diagnosis not present

## 2020-02-26 DIAGNOSIS — G8911 Acute pain due to trauma: Secondary | ICD-10-CM | POA: Insufficient documentation

## 2020-02-26 DIAGNOSIS — S2242XA Multiple fractures of ribs, left side, initial encounter for closed fracture: Secondary | ICD-10-CM

## 2020-02-26 DIAGNOSIS — R0602 Shortness of breath: Secondary | ICD-10-CM | POA: Diagnosis not present

## 2020-02-26 DIAGNOSIS — M545 Low back pain, unspecified: Secondary | ICD-10-CM

## 2020-02-26 DIAGNOSIS — Z79899 Other long term (current) drug therapy: Secondary | ICD-10-CM | POA: Insufficient documentation

## 2020-02-26 DIAGNOSIS — M546 Pain in thoracic spine: Secondary | ICD-10-CM

## 2020-02-26 DIAGNOSIS — S2242XD Multiple fractures of ribs, left side, subsequent encounter for fracture with routine healing: Secondary | ICD-10-CM | POA: Diagnosis not present

## 2020-02-26 DIAGNOSIS — R0789 Other chest pain: Secondary | ICD-10-CM | POA: Diagnosis not present

## 2020-02-26 LAB — CBC
HCT: 39.9 % (ref 39.0–52.0)
Hemoglobin: 12.9 g/dL — ABNORMAL LOW (ref 13.0–17.0)
MCH: 27.3 pg (ref 26.0–34.0)
MCHC: 32.3 g/dL (ref 30.0–36.0)
MCV: 84.5 fL (ref 80.0–100.0)
Platelets: 474 10*3/uL — ABNORMAL HIGH (ref 150–400)
RBC: 4.72 MIL/uL (ref 4.22–5.81)
RDW: 12.8 % (ref 11.5–15.5)
WBC: 6.7 10*3/uL (ref 4.0–10.5)
nRBC: 0 % (ref 0.0–0.2)

## 2020-02-26 LAB — BASIC METABOLIC PANEL
Anion gap: 10 (ref 5–15)
BUN: 21 mg/dL — ABNORMAL HIGH (ref 6–20)
CO2: 26 mmol/L (ref 22–32)
Calcium: 8.7 mg/dL — ABNORMAL LOW (ref 8.9–10.3)
Chloride: 103 mmol/L (ref 98–111)
Creatinine, Ser: 1.31 mg/dL — ABNORMAL HIGH (ref 0.61–1.24)
GFR calc Af Amer: >60 mL/min (ref >60)
GFR calc non Af Amer: >60 mL/min (ref >60)
Glucose, Bld: 113 mg/dL — ABNORMAL HIGH (ref 70–99)
Potassium: 3.7 mmol/L (ref 3.5–5.1)
Sodium: 139 mmol/L (ref 135–145)

## 2020-02-26 LAB — TROPONIN I (HIGH SENSITIVITY)
Troponin I (High Sensitivity): 5 ng/L (ref <18)
Troponin I (High Sensitivity): 6 ng/L (ref <18)
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
## 2020-02-26 MED ORDER — CYCLOBENZAPRINE HCL 10 MG PO TABS: 10.0000 mg | ORAL_TABLET | Freq: Two times a day (BID) | ORAL | 0 refills | Status: DC | PRN

## 2020-02-26 MED ORDER — LIDOCAINE 5 % EX PTCH
1.0000 | MEDICATED_PATCH | CUTANEOUS | Status: DC
  Administered 2020-02-26: 08:00:00 1 via TRANSDERMAL
  Filled 2020-02-26: qty 1

## 2020-02-26 MED ORDER — KETOROLAC TROMETHAMINE 60 MG/2ML IM SOLN
30.0000 mg | Freq: Once | INTRAMUSCULAR | Status: AC
  Administered 2020-02-26: 30 mg via INTRAMUSCULAR

## 2020-02-26 MED ORDER — OXYCODONE-ACETAMINOPHEN 5-325 MG PO TABS: 1.0000 | ORAL_TABLET | ORAL | 0 refills | Status: DC | PRN

## 2020-02-26 MED ORDER — SODIUM CHLORIDE 0.9% FLUSH: 3.0000 mL | Freq: Once | INTRAVENOUS | Status: DC

## 2020-02-26 MED ORDER — CYCLOBENZAPRINE HCL 10 MG PO TABS
10.0000 mg | ORAL_TABLET | Freq: Once | ORAL | Status: AC
  Administered 2020-02-26: 10 mg via ORAL
  Filled 2020-02-26: qty 1

## 2020-02-26 MED ORDER — KETOROLAC TROMETHAMINE 30 MG/ML IJ SOLN
30.0000 mg | Freq: Once | INTRAMUSCULAR | Status: DC
  Filled 2020-02-26: qty 1

## 2020-02-26 MED ORDER — LIDOCAINE 5 % EX PTCH: 1.0000 | MEDICATED_PATCH | CUTANEOUS | 0 refills | Status: DC
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
## 2020-02-27 ENCOUNTER — Encounter: Payer: Self-pay | Admitting: Family Medicine

## 2020-02-27 ENCOUNTER — Ambulatory Visit (INDEPENDENT_AMBULATORY_CARE_PROVIDER_SITE_OTHER): Payer: Federal, State, Local not specified - PPO | Admitting: Family Medicine

## 2020-02-27 VITALS — BP 150/98 | HR 79 | Temp 99.3°F | Ht 71.0 in | Wt 383.4 lb

## 2020-02-27 DIAGNOSIS — R030 Elevated blood-pressure reading, without diagnosis of hypertension: Secondary | ICD-10-CM | POA: Diagnosis not present

## 2020-02-27 DIAGNOSIS — S2242XD Multiple fractures of ribs, left side, subsequent encounter for fracture with routine healing: Secondary | ICD-10-CM

## 2020-02-27 DIAGNOSIS — M546 Pain in thoracic spine: Secondary | ICD-10-CM

## 2020-02-27 MED ORDER — AMLODIPINE BESYLATE 5 MG PO TABS: 5.0000 mg | ORAL_TABLET | Freq: Every day | ORAL | 0 refills | Status: DC
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
## 2020-02-27 NOTE — Patient Instructions (Signed)
1) blood pressure medication sent in called norvasc (amolodipine). You will take this daily in the AM. Keep a log and make sure you are not eating a lot of salt. As pain gets better controlled, hopefully this will get better. If blood pressure starts to come down to 120/80 or less we could stop medication, but at this point it's really high.   2) see you back in one month for check up on this.   3) let me know when you need refills of your pain medication (goal is to drop down to hydrocodone or tramadol then off), lidocaine patches or muscle relaxer.   4) referral sent to aquatic PT as requested by lauren  5) note for massage therapy and work today.   You look better! Slowly but surely...   Dr. Artis Flock
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
## 2020-02-28 ENCOUNTER — Encounter: Payer: Self-pay | Admitting: Physical Therapy

## 2020-02-28 DIAGNOSIS — F4320 Adjustment disorder, unspecified: Secondary | ICD-10-CM | POA: Diagnosis not present
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
## 2020-03-05 ENCOUNTER — Other Ambulatory Visit: Payer: Self-pay | Admitting: Family Medicine

## 2020-03-05 ENCOUNTER — Encounter: Payer: Self-pay | Admitting: Family Medicine

## 2020-03-05 DIAGNOSIS — M545 Low back pain: Secondary | ICD-10-CM | POA: Diagnosis not present

## 2020-03-05 DIAGNOSIS — M546 Pain in thoracic spine: Secondary | ICD-10-CM | POA: Diagnosis not present

## 2020-03-05 DIAGNOSIS — M79602 Pain in left arm: Secondary | ICD-10-CM | POA: Diagnosis not present

## 2020-03-05 DIAGNOSIS — M542 Cervicalgia: Secondary | ICD-10-CM | POA: Diagnosis not present

## 2020-03-05 NOTE — Telephone Encounter (Signed)
Please advise 

## 2020-03-06 ENCOUNTER — Telehealth: Payer: Self-pay | Admitting: Family Medicine

## 2020-03-06 DIAGNOSIS — F4320 Adjustment disorder, unspecified: Secondary | ICD-10-CM | POA: Diagnosis not present

## 2020-03-06 NOTE — Telephone Encounter (Signed)
We probably need to start him on medication. Can they come in on Monday? If any suicidal thoughts or concern for his safety.Marland Kitchen go to ER. Id also suggest they start counseling. Do not need a referral for this.  [name], MD [facility]

## 2020-03-06 NOTE — Telephone Encounter (Signed)
Please schedule pt to come in Monday if possible.  Thank You
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
## 2020-03-06 NOTE — Telephone Encounter (Signed)
Patient is scheduled for Monday @ 8:40.

## 2020-03-08 ENCOUNTER — Other Ambulatory Visit: Payer: Self-pay | Admitting: Family Medicine

## 2020-03-09 ENCOUNTER — Ambulatory Visit (INDEPENDENT_AMBULATORY_CARE_PROVIDER_SITE_OTHER): Payer: Federal, State, Local not specified - PPO | Admitting: Family Medicine

## 2020-03-09 ENCOUNTER — Other Ambulatory Visit: Payer: Self-pay

## 2020-03-09 ENCOUNTER — Encounter: Payer: Self-pay | Admitting: Family Medicine

## 2020-03-09 VITALS — BP 170/110 | HR 93 | Temp 98.0°F | Ht 71.0 in | Wt 389.8 lb

## 2020-03-09 DIAGNOSIS — I10 Essential (primary) hypertension: Secondary | ICD-10-CM

## 2020-03-09 DIAGNOSIS — F4310 Post-traumatic stress disorder, unspecified: Secondary | ICD-10-CM | POA: Diagnosis not present

## 2020-03-09 DIAGNOSIS — R413 Other amnesia: Secondary | ICD-10-CM | POA: Diagnosis not present

## 2020-03-09 MED ORDER — LIDOCAINE 5 % EX PTCH: 1.0000 | MEDICATED_PATCH | CUTANEOUS | 1 refills | Status: DC

## 2020-03-09 MED ORDER — CYCLOBENZAPRINE HCL 10 MG PO TABS: 10.0000 mg | ORAL_TABLET | Freq: Two times a day (BID) | ORAL | 1 refills | Status: DC | PRN
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
## 2020-03-11 DIAGNOSIS — M79602 Pain in left arm: Secondary | ICD-10-CM | POA: Diagnosis not present

## 2020-03-11 DIAGNOSIS — M545 Low back pain: Secondary | ICD-10-CM | POA: Diagnosis not present

## 2020-03-11 DIAGNOSIS — M546 Pain in thoracic spine: Secondary | ICD-10-CM | POA: Diagnosis not present

## 2020-03-11 DIAGNOSIS — M542 Cervicalgia: Secondary | ICD-10-CM | POA: Diagnosis not present

## 2020-03-16 DIAGNOSIS — M542 Cervicalgia: Secondary | ICD-10-CM | POA: Diagnosis not present

## 2020-03-16 DIAGNOSIS — M79602 Pain in left arm: Secondary | ICD-10-CM | POA: Diagnosis not present

## 2020-03-16 DIAGNOSIS — M546 Pain in thoracic spine: Secondary | ICD-10-CM | POA: Diagnosis not present

## 2020-03-16 DIAGNOSIS — M545 Low back pain: Secondary | ICD-10-CM | POA: Diagnosis not present

## 2020-03-18 DIAGNOSIS — M79602 Pain in left arm: Secondary | ICD-10-CM | POA: Diagnosis not present

## 2020-03-18 DIAGNOSIS — M545 Low back pain: Secondary | ICD-10-CM | POA: Diagnosis not present

## 2020-03-18 DIAGNOSIS — M542 Cervicalgia: Secondary | ICD-10-CM | POA: Diagnosis not present

## 2020-03-18 DIAGNOSIS — M546 Pain in thoracic spine: Secondary | ICD-10-CM | POA: Diagnosis not present

## 2020-03-23 ENCOUNTER — Ambulatory Visit (INDEPENDENT_AMBULATORY_CARE_PROVIDER_SITE_OTHER): Payer: Federal, State, Local not specified - PPO

## 2020-03-23 ENCOUNTER — Encounter: Payer: Self-pay | Admitting: Family Medicine

## 2020-03-23 ENCOUNTER — Other Ambulatory Visit: Payer: Self-pay

## 2020-03-23 ENCOUNTER — Ambulatory Visit (INDEPENDENT_AMBULATORY_CARE_PROVIDER_SITE_OTHER): Payer: Federal, State, Local not specified - PPO | Admitting: Family Medicine

## 2020-03-23 VITALS — BP 168/100 | HR 87 | Temp 97.2°F | Ht 71.0 in | Wt 391.4 lb

## 2020-03-23 DIAGNOSIS — M545 Low back pain, unspecified: Secondary | ICD-10-CM

## 2020-03-23 DIAGNOSIS — E559 Vitamin D deficiency, unspecified: Secondary | ICD-10-CM

## 2020-03-23 DIAGNOSIS — M25512 Pain in left shoulder: Secondary | ICD-10-CM

## 2020-03-23 DIAGNOSIS — N179 Acute kidney failure, unspecified: Secondary | ICD-10-CM

## 2020-03-23 DIAGNOSIS — E7849 Other hyperlipidemia: Secondary | ICD-10-CM | POA: Diagnosis not present

## 2020-03-23 DIAGNOSIS — G8911 Acute pain due to trauma: Secondary | ICD-10-CM

## 2020-03-23 DIAGNOSIS — I10 Essential (primary) hypertension: Secondary | ICD-10-CM | POA: Diagnosis not present

## 2020-03-23 DIAGNOSIS — F4310 Post-traumatic stress disorder, unspecified: Secondary | ICD-10-CM | POA: Insufficient documentation

## 2020-03-23 DIAGNOSIS — S4992XA Unspecified injury of left shoulder and upper arm, initial encounter: Secondary | ICD-10-CM | POA: Diagnosis not present

## 2020-03-23 LAB — CBC WITH DIFFERENTIAL/PLATELET
Basophils Absolute: 0 10*3/uL (ref 0.0–0.1)
Basophils Relative: 0.4 % (ref 0.0–3.0)
Eosinophils Absolute: 0.1 10*3/uL (ref 0.0–0.7)
Eosinophils Relative: 2.3 % (ref 0.0–5.0)
HCT: 43.1 % (ref 39.0–52.0)
Hemoglobin: 14.6 g/dL (ref 13.0–17.0)
Lymphocytes Relative: 46 % (ref 12.0–46.0)
Lymphs Abs: 2.8 10*3/uL (ref 0.7–4.0)
MCHC: 33.7 g/dL (ref 30.0–36.0)
MCV: 81.9 fl (ref 78.0–100.0)
Monocytes Absolute: 0.7 10*3/uL (ref 0.1–1.0)
Monocytes Relative: 12.4 % — ABNORMAL HIGH (ref 3.0–12.0)
Neutro Abs: 2.4 10*3/uL (ref 1.4–7.7)
Neutrophils Relative %: 38.9 % — ABNORMAL LOW (ref 43.0–77.0)
Platelets: 377 10*3/uL (ref 150.0–400.0)
RBC: 5.27 Mil/uL (ref 4.22–5.81)
RDW: 14.1 % (ref 11.5–15.5)
WBC: 6 10*3/uL (ref 4.0–10.5)

## 2020-03-23 LAB — COMPREHENSIVE METABOLIC PANEL
ALT: 14 U/L (ref 0–53)
AST: 17 U/L (ref 0–37)
Albumin: 4.4 g/dL (ref 3.5–5.2)
Alkaline Phosphatase: 132 U/L — ABNORMAL HIGH (ref 39–117)
BUN: 12 mg/dL (ref 6–23)
CO2: 30 mEq/L (ref 19–32)
Calcium: 9.4 mg/dL (ref 8.4–10.5)
Chloride: 102 mEq/L (ref 96–112)
Creatinine, Ser: 1.12 mg/dL (ref 0.40–1.50)
GFR: 92.12 mL/min (ref >60.00)
Glucose, Bld: 89 mg/dL (ref 70–99)
Potassium: 4.3 mEq/L (ref 3.5–5.1)
Sodium: 138 mEq/L (ref 135–145)
Total Bilirubin: 0.3 mg/dL (ref 0.2–1.2)
Total Protein: 7.3 g/dL (ref 6.0–8.3)

## 2020-03-23 LAB — LIPID PANEL
Cholesterol: 214 mg/dL — ABNORMAL HIGH (ref 0–200)
HDL: 39.8 mg/dL (ref >39.00)
LDL Cholesterol: 156 mg/dL — ABNORMAL HIGH (ref 0–99)
NonHDL: 174.1
Total CHOL/HDL Ratio: 5
Triglycerides: 91 mg/dL (ref 0.0–149.0)
VLDL: 18.2 mg/dL (ref 0.0–40.0)

## 2020-03-23 LAB — VITAMIN D 25 HYDROXY (VIT D DEFICIENCY, FRACTURES): VITD: 11.43 ng/mL — ABNORMAL LOW (ref 30.00–100.00)
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
## 2020-03-23 MED ORDER — VENLAFAXINE HCL ER 37.5 MG PO CP24: 37.5000 mg | ORAL_CAPSULE | Freq: Every day | ORAL | 0 refills | Status: DC

## 2020-03-23 MED ORDER — LISINOPRIL 20 MG PO TABS: 20.0000 mg | ORAL_TABLET | Freq: Every day | ORAL | 3 refills | Status: DC

## 2020-03-23 MED ORDER — TRAMADOL HCL 50 MG PO TABS: 50.0000 mg | ORAL_TABLET | Freq: Three times a day (TID) | ORAL | 0 refills | Status: AC | PRN
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
## 2020-03-23 NOTE — Patient Instructions (Addendum)
Blood pressure 1) stopping norvasc since you have the headache. We are going to start you on lisinopril 20mg /day. Will take this in the AM only. Biggest side effect is a dry cough. Worst is swelling of the lips and throat. If this happens go to ER.    PTSD/depression -starting effexor 37.5mg /day. let me know if any issues. Will take about 3-4 weeks to get into system to work. Continue counseling. Do not take at same time as trazodone. Can interact.   Shoulder pain -xray today and MRI ordered. If any issues ill send to ortho.   Back pain -MRI ordered   Rechecking labs today as well.

## 2020-03-25 ENCOUNTER — Encounter: Payer: Self-pay | Admitting: Family Medicine

## 2020-03-25 ENCOUNTER — Other Ambulatory Visit: Payer: Self-pay | Admitting: Family Medicine

## 2020-03-25 DIAGNOSIS — M542 Cervicalgia: Secondary | ICD-10-CM | POA: Diagnosis not present

## 2020-03-25 DIAGNOSIS — M546 Pain in thoracic spine: Secondary | ICD-10-CM | POA: Diagnosis not present

## 2020-03-25 DIAGNOSIS — M79602 Pain in left arm: Secondary | ICD-10-CM | POA: Diagnosis not present

## 2020-03-25 DIAGNOSIS — M545 Low back pain: Secondary | ICD-10-CM | POA: Diagnosis not present

## 2020-03-25 DIAGNOSIS — E559 Vitamin D deficiency, unspecified: Secondary | ICD-10-CM | POA: Insufficient documentation

## 2020-03-25 MED ORDER — VITAMIN D (ERGOCALCIFEROL) 1.25 MG (50000 UNIT) PO CAPS: ORAL_CAPSULE | ORAL | 0 refills | Status: DC

## 2020-03-26 DIAGNOSIS — L649 Androgenic alopecia, unspecified: Secondary | ICD-10-CM | POA: Diagnosis not present

## 2020-03-26 DIAGNOSIS — L819 Disorder of pigmentation, unspecified: Secondary | ICD-10-CM | POA: Diagnosis not present

## 2020-03-26 DIAGNOSIS — L299 Pruritus, unspecified: Secondary | ICD-10-CM | POA: Diagnosis not present

## 2020-03-31 ENCOUNTER — Other Ambulatory Visit: Payer: Self-pay

## 2020-03-31 DIAGNOSIS — M25512 Pain in left shoulder: Secondary | ICD-10-CM

## 2020-03-31 DIAGNOSIS — G8911 Acute pain due to trauma: Secondary | ICD-10-CM

## 2020-03-31 NOTE — Addendum Note (Signed)
Addended by: Janeece Riggers D on: 03/31/2020 03:16 PM   Modules accepted: Orders

## 2020-04-01 DIAGNOSIS — F4320 Adjustment disorder, unspecified: Secondary | ICD-10-CM | POA: Diagnosis not present

## 2020-04-02 DIAGNOSIS — M545 Low back pain: Secondary | ICD-10-CM | POA: Diagnosis not present

## 2020-04-02 DIAGNOSIS — M542 Cervicalgia: Secondary | ICD-10-CM | POA: Diagnosis not present

## 2020-04-02 DIAGNOSIS — M79602 Pain in left arm: Secondary | ICD-10-CM | POA: Diagnosis not present

## 2020-04-02 DIAGNOSIS — M546 Pain in thoracic spine: Secondary | ICD-10-CM | POA: Diagnosis not present

## 2020-04-08 DIAGNOSIS — M545 Low back pain: Secondary | ICD-10-CM | POA: Diagnosis not present

## 2020-04-08 DIAGNOSIS — M79602 Pain in left arm: Secondary | ICD-10-CM | POA: Diagnosis not present

## 2020-04-08 DIAGNOSIS — F4320 Adjustment disorder, unspecified: Secondary | ICD-10-CM | POA: Diagnosis not present

## 2020-04-08 DIAGNOSIS — M542 Cervicalgia: Secondary | ICD-10-CM | POA: Diagnosis not present

## 2020-04-08 DIAGNOSIS — M546 Pain in thoracic spine: Secondary | ICD-10-CM | POA: Diagnosis not present

## 2020-04-13 ENCOUNTER — Encounter: Payer: Self-pay | Admitting: Family Medicine

## 2020-04-13 DIAGNOSIS — M5416 Radiculopathy, lumbar region: Secondary | ICD-10-CM | POA: Diagnosis not present

## 2020-04-14 ENCOUNTER — Other Ambulatory Visit: Payer: Self-pay

## 2020-04-14 DIAGNOSIS — M25512 Pain in left shoulder: Secondary | ICD-10-CM

## 2020-04-14 DIAGNOSIS — G8911 Acute pain due to trauma: Secondary | ICD-10-CM

## 2020-04-15 ENCOUNTER — Telehealth: Payer: Self-pay | Admitting: Family Medicine

## 2020-04-15 ENCOUNTER — Encounter: Payer: Self-pay | Admitting: Family Medicine

## 2020-04-15 DIAGNOSIS — F4320 Adjustment disorder, unspecified: Secondary | ICD-10-CM | POA: Diagnosis not present

## 2020-04-15 NOTE — Telephone Encounter (Signed)
Let him know his lumbar MRI shows the following.Shawn KitchenMarland [name] 1) he has mild L4-L5 neural foraminal narrowing which contacts L4 nerve root 2) moderate right lateral bulging disc at L5-S1 3) has some mild narrowing of his thecal sac (this is a tube like structure that surrounds the spinal cord).   I think he needs to keep up with PT and if he still has pain in his back we need to send to pain management to see if candidate for injection.   Let me know if he is still having pain and I can put his referral in.   Dr. Artis Flock

## 2020-04-17 DIAGNOSIS — F4320 Adjustment disorder, unspecified: Secondary | ICD-10-CM | POA: Diagnosis not present

## 2020-04-22 ENCOUNTER — Other Ambulatory Visit: Payer: Federal, State, Local not specified - PPO

## 2020-04-23 ENCOUNTER — Encounter: Payer: Self-pay | Admitting: Family Medicine

## 2020-04-23 ENCOUNTER — Ambulatory Visit (INDEPENDENT_AMBULATORY_CARE_PROVIDER_SITE_OTHER): Payer: Federal, State, Local not specified - PPO | Admitting: Family Medicine

## 2020-04-23 VITALS — BP 170/112 | HR 91 | Temp 97.9°F | Ht 71.0 in | Wt 389.0 lb

## 2020-04-23 DIAGNOSIS — I10 Essential (primary) hypertension: Secondary | ICD-10-CM | POA: Diagnosis not present

## 2020-04-23 DIAGNOSIS — F4310 Post-traumatic stress disorder, unspecified: Secondary | ICD-10-CM | POA: Diagnosis not present

## 2020-04-23 DIAGNOSIS — S2242XA Multiple fractures of ribs, left side, initial encounter for closed fracture: Secondary | ICD-10-CM | POA: Diagnosis not present

## 2020-04-23 MED ORDER — MELOXICAM 15 MG PO TABS: 15.0000 mg | ORAL_TABLET | Freq: Every day | ORAL | 0 refills | Status: DC

## 2020-04-23 MED ORDER — KETOROLAC TROMETHAMINE 60 MG/2ML IM SOLN
60.0000 mg | Freq: Once | INTRAMUSCULAR | Status: AC
  Administered 2020-04-23: 60 mg via INTRAMUSCULAR

## 2020-04-23 MED ORDER — LISINOPRIL 40 MG PO TABS: 40.0000 mg | ORAL_TABLET | Freq: Every day | ORAL | 1 refills | Status: DC

## 2020-04-23 NOTE — Progress Notes (Signed)
Pt had Ketoralac 60 mg injection in the office today. He tolerated well.

## 2020-04-23 NOTE — Patient Instructions (Signed)
1) increasing your lisinopril to 40mg . I sent in a 40mg  pill. You can take two of your 20mg  pills till you run out.
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
## 2020-04-24 DIAGNOSIS — M19012 Primary osteoarthritis, left shoulder: Secondary | ICD-10-CM | POA: Diagnosis not present

## 2020-04-24 DIAGNOSIS — M25412 Effusion, left shoulder: Secondary | ICD-10-CM | POA: Diagnosis not present

## 2020-04-24 DIAGNOSIS — R936 Abnormal findings on diagnostic imaging of limbs: Secondary | ICD-10-CM | POA: Diagnosis not present

## 2020-04-27 ENCOUNTER — Encounter: Payer: Self-pay | Admitting: Family Medicine

## 2020-04-27 ENCOUNTER — Other Ambulatory Visit: Payer: Self-pay | Admitting: Family Medicine

## 2020-04-27 DIAGNOSIS — M25512 Pain in left shoulder: Secondary | ICD-10-CM

## 2020-05-05 DIAGNOSIS — M79602 Pain in left arm: Secondary | ICD-10-CM | POA: Diagnosis not present

## 2020-05-05 DIAGNOSIS — M542 Cervicalgia: Secondary | ICD-10-CM | POA: Diagnosis not present

## 2020-05-05 DIAGNOSIS — M546 Pain in thoracic spine: Secondary | ICD-10-CM | POA: Diagnosis not present

## 2020-05-05 DIAGNOSIS — M545 Low back pain: Secondary | ICD-10-CM | POA: Diagnosis not present

## 2020-05-11 ENCOUNTER — Telehealth: Payer: Self-pay | Admitting: Family Medicine

## 2020-05-11 NOTE — Telephone Encounter (Signed)
Patient is calling in this afternoon asking if Dr.Wolfe could extend his work note to 7/30, states his boss is trying to have him come in this week.

## 2020-05-11 NOTE — Telephone Encounter (Signed)
Yes, I will do this. Just haven't gotten to the [place] message yet.  Dr. Artis Flock

## 2020-05-11 NOTE — Telephone Encounter (Signed)
Please Advise

## 2020-05-11 NOTE — Telephone Encounter (Signed)
Note sent by Provider via MyChart.

## 2020-05-13 DIAGNOSIS — M79602 Pain in left arm: Secondary | ICD-10-CM | POA: Diagnosis not present

## 2020-05-13 DIAGNOSIS — M545 Low back pain: Secondary | ICD-10-CM | POA: Diagnosis not present

## 2020-05-13 DIAGNOSIS — M542 Cervicalgia: Secondary | ICD-10-CM | POA: Diagnosis not present

## 2020-05-13 DIAGNOSIS — M546 Pain in thoracic spine: Secondary | ICD-10-CM | POA: Diagnosis not present

## 2020-05-14 DIAGNOSIS — M546 Pain in thoracic spine: Secondary | ICD-10-CM | POA: Diagnosis not present

## 2020-05-14 DIAGNOSIS — M79602 Pain in left arm: Secondary | ICD-10-CM | POA: Diagnosis not present

## 2020-05-14 DIAGNOSIS — M542 Cervicalgia: Secondary | ICD-10-CM | POA: Diagnosis not present

## 2020-05-14 DIAGNOSIS — M545 Low back pain: Secondary | ICD-10-CM | POA: Diagnosis not present

## 2020-05-18 ENCOUNTER — Other Ambulatory Visit: Payer: Self-pay | Admitting: Family Medicine

## 2020-05-19 DIAGNOSIS — M546 Pain in thoracic spine: Secondary | ICD-10-CM | POA: Diagnosis not present

## 2020-05-19 DIAGNOSIS — M542 Cervicalgia: Secondary | ICD-10-CM | POA: Diagnosis not present

## 2020-05-19 DIAGNOSIS — M545 Low back pain: Secondary | ICD-10-CM | POA: Diagnosis not present

## 2020-05-19 DIAGNOSIS — M79602 Pain in left arm: Secondary | ICD-10-CM | POA: Diagnosis not present

## 2020-05-21 ENCOUNTER — Other Ambulatory Visit: Payer: Self-pay | Admitting: Family Medicine

## 2020-05-21 DIAGNOSIS — M546 Pain in thoracic spine: Secondary | ICD-10-CM | POA: Diagnosis not present

## 2020-05-21 DIAGNOSIS — M542 Cervicalgia: Secondary | ICD-10-CM | POA: Diagnosis not present

## 2020-05-21 DIAGNOSIS — M79602 Pain in left arm: Secondary | ICD-10-CM | POA: Diagnosis not present

## 2020-05-21 DIAGNOSIS — M545 Low back pain: Secondary | ICD-10-CM | POA: Diagnosis not present

## 2020-05-26 DIAGNOSIS — M545 Low back pain: Secondary | ICD-10-CM | POA: Diagnosis not present

## 2020-05-26 DIAGNOSIS — M79602 Pain in left arm: Secondary | ICD-10-CM | POA: Diagnosis not present

## 2020-05-26 DIAGNOSIS — M542 Cervicalgia: Secondary | ICD-10-CM | POA: Diagnosis not present

## 2020-05-26 DIAGNOSIS — M546 Pain in thoracic spine: Secondary | ICD-10-CM | POA: Diagnosis not present

## 2020-05-28 DIAGNOSIS — M542 Cervicalgia: Secondary | ICD-10-CM | POA: Diagnosis not present

## 2020-05-28 DIAGNOSIS — M79602 Pain in left arm: Secondary | ICD-10-CM | POA: Diagnosis not present

## 2020-05-28 DIAGNOSIS — M546 Pain in thoracic spine: Secondary | ICD-10-CM | POA: Diagnosis not present

## 2020-05-28 DIAGNOSIS — M545 Low back pain: Secondary | ICD-10-CM | POA: Diagnosis not present

## 2020-05-29 DIAGNOSIS — F4320 Adjustment disorder, unspecified: Secondary | ICD-10-CM | POA: Diagnosis not present

## 2020-06-02 DIAGNOSIS — M79602 Pain in left arm: Secondary | ICD-10-CM | POA: Diagnosis not present

## 2020-06-02 DIAGNOSIS — M545 Low back pain: Secondary | ICD-10-CM | POA: Diagnosis not present

## 2020-06-02 DIAGNOSIS — M542 Cervicalgia: Secondary | ICD-10-CM | POA: Diagnosis not present

## 2020-06-02 DIAGNOSIS — M546 Pain in thoracic spine: Secondary | ICD-10-CM | POA: Diagnosis not present

## 2020-06-09 DIAGNOSIS — M542 Cervicalgia: Secondary | ICD-10-CM | POA: Diagnosis not present

## 2020-06-09 DIAGNOSIS — M545 Low back pain: Secondary | ICD-10-CM | POA: Diagnosis not present

## 2020-06-09 DIAGNOSIS — M79602 Pain in left arm: Secondary | ICD-10-CM | POA: Diagnosis not present

## 2020-06-09 DIAGNOSIS — M546 Pain in thoracic spine: Secondary | ICD-10-CM | POA: Diagnosis not present

## 2020-06-11 DIAGNOSIS — M546 Pain in thoracic spine: Secondary | ICD-10-CM | POA: Diagnosis not present

## 2020-06-11 DIAGNOSIS — M79602 Pain in left arm: Secondary | ICD-10-CM | POA: Diagnosis not present

## 2020-06-11 DIAGNOSIS — M545 Low back pain: Secondary | ICD-10-CM | POA: Diagnosis not present

## 2020-06-11 DIAGNOSIS — M542 Cervicalgia: Secondary | ICD-10-CM | POA: Diagnosis not present

## 2020-06-15 ENCOUNTER — Other Ambulatory Visit: Payer: Self-pay | Admitting: Family Medicine

## 2020-06-15 DIAGNOSIS — F4323 Adjustment disorder with mixed anxiety and depressed mood: Secondary | ICD-10-CM | POA: Diagnosis not present

## 2020-06-16 ENCOUNTER — Other Ambulatory Visit: Payer: Self-pay | Admitting: Family Medicine

## 2020-06-16 DIAGNOSIS — M79602 Pain in left arm: Secondary | ICD-10-CM | POA: Diagnosis not present

## 2020-06-16 DIAGNOSIS — M546 Pain in thoracic spine: Secondary | ICD-10-CM | POA: Diagnosis not present

## 2020-06-16 DIAGNOSIS — M542 Cervicalgia: Secondary | ICD-10-CM | POA: Diagnosis not present

## 2020-06-16 DIAGNOSIS — M545 Low back pain: Secondary | ICD-10-CM | POA: Diagnosis not present

## 2020-06-18 DIAGNOSIS — M546 Pain in thoracic spine: Secondary | ICD-10-CM | POA: Diagnosis not present

## 2020-06-18 DIAGNOSIS — M542 Cervicalgia: Secondary | ICD-10-CM | POA: Diagnosis not present

## 2020-06-18 DIAGNOSIS — M545 Low back pain: Secondary | ICD-10-CM | POA: Diagnosis not present

## 2020-06-18 DIAGNOSIS — M79602 Pain in left arm: Secondary | ICD-10-CM | POA: Diagnosis not present

## 2020-06-22 ENCOUNTER — Encounter: Payer: Self-pay | Admitting: Family Medicine

## 2020-06-24 MED ORDER — CYCLOBENZAPRINE HCL 10 MG PO TABS: 10.0000 mg | ORAL_TABLET | Freq: Three times a day (TID) | ORAL | 1 refills | Status: DC | PRN

## 2020-06-24 MED ORDER — TRAMADOL HCL 50 MG PO TABS: 50.0000 mg | ORAL_TABLET | Freq: Three times a day (TID) | ORAL | 0 refills | Status: AC | PRN

## 2020-06-25 DIAGNOSIS — M545 Low back pain: Secondary | ICD-10-CM | POA: Diagnosis not present

## 2020-06-25 DIAGNOSIS — M542 Cervicalgia: Secondary | ICD-10-CM | POA: Diagnosis not present

## 2020-06-25 DIAGNOSIS — M546 Pain in thoracic spine: Secondary | ICD-10-CM | POA: Diagnosis not present

## 2020-06-25 DIAGNOSIS — M79602 Pain in left arm: Secondary | ICD-10-CM | POA: Diagnosis not present

## 2020-06-26 ENCOUNTER — Encounter: Payer: Self-pay | Admitting: Family Medicine

## 2020-07-02 DIAGNOSIS — M542 Cervicalgia: Secondary | ICD-10-CM | POA: Diagnosis not present

## 2020-07-02 DIAGNOSIS — M79602 Pain in left arm: Secondary | ICD-10-CM | POA: Diagnosis not present

## 2020-07-02 DIAGNOSIS — M545 Low back pain: Secondary | ICD-10-CM | POA: Diagnosis not present

## 2020-07-02 DIAGNOSIS — M546 Pain in thoracic spine: Secondary | ICD-10-CM | POA: Diagnosis not present

## 2020-07-03 DIAGNOSIS — F4320 Adjustment disorder, unspecified: Secondary | ICD-10-CM | POA: Diagnosis not present

## 2020-07-07 DIAGNOSIS — M545 Low back pain: Secondary | ICD-10-CM | POA: Diagnosis not present

## 2020-07-07 DIAGNOSIS — M79602 Pain in left arm: Secondary | ICD-10-CM | POA: Diagnosis not present

## 2020-07-07 DIAGNOSIS — M542 Cervicalgia: Secondary | ICD-10-CM | POA: Diagnosis not present

## 2020-07-07 DIAGNOSIS — M546 Pain in thoracic spine: Secondary | ICD-10-CM | POA: Diagnosis not present

## 2020-07-09 ENCOUNTER — Ambulatory Visit: Payer: Federal, State, Local not specified - PPO

## 2020-07-09 ENCOUNTER — Other Ambulatory Visit: Payer: Self-pay

## 2020-07-09 ENCOUNTER — Ambulatory Visit: Payer: Federal, State, Local not specified - PPO | Admitting: Family Medicine

## 2020-07-09 ENCOUNTER — Telehealth: Payer: Self-pay | Admitting: Family Medicine

## 2020-07-09 VITALS — BP 154/116 | HR 86

## 2020-07-09 DIAGNOSIS — M545 Low back pain: Secondary | ICD-10-CM | POA: Diagnosis not present

## 2020-07-09 DIAGNOSIS — M542 Cervicalgia: Secondary | ICD-10-CM | POA: Diagnosis not present

## 2020-07-09 DIAGNOSIS — M546 Pain in thoracic spine: Secondary | ICD-10-CM | POA: Diagnosis not present

## 2020-07-09 DIAGNOSIS — I10 Essential (primary) hypertension: Secondary | ICD-10-CM

## 2020-07-09 DIAGNOSIS — M79602 Pain in left arm: Secondary | ICD-10-CM | POA: Diagnosis not present

## 2020-07-09 DIAGNOSIS — Z0289 Encounter for other administrative examinations: Secondary | ICD-10-CM

## 2020-07-09 MED ORDER — MELOXICAM 15 MG PO TABS: 15.0000 mg | ORAL_TABLET | Freq: Every day | ORAL | 0 refills | Status: DC

## 2020-07-09 NOTE — Telephone Encounter (Signed)
Will make sure he is taking his lisinopril 40mg /daily. If he is, we are going to need to add on another medication as this is still way over goal.   I gave a 90 day supply of lisinopril 6/3 so he should have enough still.  Dr. 8/3

## 2020-07-09 NOTE — Telephone Encounter (Signed)
Patient is returning Melitta's call states he is going in to PT at 4 asked if someone could leave a voicemail.

## 2020-07-09 NOTE — Progress Notes (Signed)
Pt came in today for BP check. Blood Pressure was 154/116. Pt says that he may need a refill on his B/P medication.

## 2020-07-09 NOTE — Telephone Encounter (Signed)
I spoke with the pt to give message below. He did verify that he is taking Lisinopril. But he needs pain medication (Meloxicam) instead.  Meloxicam was sent to pharmacy.

## 2020-07-09 NOTE — Telephone Encounter (Signed)
Lvm for pt to call the office back. 

## 2020-07-10 ENCOUNTER — Other Ambulatory Visit: Payer: Federal, State, Local not specified - PPO

## 2020-07-10 ENCOUNTER — Other Ambulatory Visit: Payer: Self-pay | Admitting: Sleep Medicine

## 2020-07-10 DIAGNOSIS — Z20822 Contact with and (suspected) exposure to covid-19: Secondary | ICD-10-CM | POA: Diagnosis not present

## 2020-07-10 DIAGNOSIS — F4320 Adjustment disorder, unspecified: Secondary | ICD-10-CM | POA: Diagnosis not present

## 2020-07-12 LAB — NOVEL CORONAVIRUS, NAA: SARS-CoV-2, NAA: NOT DETECTED

## 2020-07-12 LAB — SARS-COV-2, NAA 2 DAY TAT

## 2020-07-13 ENCOUNTER — Other Ambulatory Visit: Payer: Self-pay

## 2020-07-13 ENCOUNTER — Encounter: Payer: Self-pay | Admitting: Family Medicine

## 2020-07-13 ENCOUNTER — Ambulatory Visit: Payer: Federal, State, Local not specified - PPO | Admitting: Family Medicine

## 2020-07-13 VITALS — BP 167/94 | HR 80 | Temp 98.4°F | Ht 71.0 in | Wt >= 6400 oz

## 2020-07-13 DIAGNOSIS — M545 Low back pain: Secondary | ICD-10-CM | POA: Diagnosis not present

## 2020-07-13 DIAGNOSIS — R0781 Pleurodynia: Secondary | ICD-10-CM

## 2020-07-13 DIAGNOSIS — M542 Cervicalgia: Secondary | ICD-10-CM | POA: Diagnosis not present

## 2020-07-13 DIAGNOSIS — M546 Pain in thoracic spine: Secondary | ICD-10-CM | POA: Diagnosis not present

## 2020-07-13 DIAGNOSIS — I10 Essential (primary) hypertension: Secondary | ICD-10-CM

## 2020-07-13 DIAGNOSIS — S43432S Superior glenoid labrum lesion of left shoulder, sequela: Secondary | ICD-10-CM

## 2020-07-13 DIAGNOSIS — M79602 Pain in left arm: Secondary | ICD-10-CM | POA: Diagnosis not present

## 2020-07-13 MED ORDER — TRAMADOL HCL 50 MG PO TABS: 50.0000 mg | ORAL_TABLET | Freq: Three times a day (TID) | ORAL | 0 refills | Status: AC | PRN

## 2020-07-13 MED ORDER — HYDROCHLOROTHIAZIDE 25 MG PO TABS: 25.0000 mg | ORAL_TABLET | Freq: Every day | ORAL | 3 refills | Status: DC

## 2020-07-13 MED ORDER — VENLAFAXINE HCL ER 75 MG PO CP24: 75.0000 mg | ORAL_CAPSULE | Freq: Every day | ORAL | 1 refills | Status: DC
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
## 2020-07-13 NOTE — Patient Instructions (Addendum)
I have ordered xrays for you. At this time we do not have xrays in our clinic. You will have to go to our [place] [facility]. The address is [address]  xray is located in the basement.  Hours of operation are M-F 8:30am to 5:00pm.  Closed for lunch between 12:30 and 1:00pm.     1) continue lisinopril 40mg . New drug is hydrochlorithiazide 25mg . I want you to take 1/2 pill in the AM daily and then come back in 1-2 weeks for nurse visit. If still elevated we will increase to a full pill which I think you will need.   2) increased effexor to 75mg /day  3) xrays per above.    See you back in one month for BP check.  Dr. 

## 2020-07-22 ENCOUNTER — Encounter: Payer: Self-pay | Admitting: Family Medicine

## 2020-07-23 DIAGNOSIS — M542 Cervicalgia: Secondary | ICD-10-CM | POA: Diagnosis not present

## 2020-07-23 DIAGNOSIS — M546 Pain in thoracic spine: Secondary | ICD-10-CM | POA: Diagnosis not present

## 2020-07-23 DIAGNOSIS — M79602 Pain in left arm: Secondary | ICD-10-CM | POA: Diagnosis not present

## 2020-07-23 DIAGNOSIS — M545 Low back pain: Secondary | ICD-10-CM | POA: Diagnosis not present

## 2020-07-24 DIAGNOSIS — F4320 Adjustment disorder, unspecified: Secondary | ICD-10-CM | POA: Diagnosis not present

## 2020-07-28 ENCOUNTER — Encounter: Payer: Self-pay | Admitting: Orthopaedic Surgery

## 2020-07-28 ENCOUNTER — Encounter: Payer: Self-pay | Admitting: Family Medicine

## 2020-07-28 ENCOUNTER — Ambulatory Visit: Payer: Federal, State, Local not specified - PPO | Admitting: Orthopaedic Surgery

## 2020-07-28 VITALS — Ht 70.5 in | Wt 396.0 lb

## 2020-07-28 DIAGNOSIS — M25512 Pain in left shoulder: Secondary | ICD-10-CM | POA: Diagnosis not present
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
## 2020-07-30 ENCOUNTER — Ambulatory Visit (INDEPENDENT_AMBULATORY_CARE_PROVIDER_SITE_OTHER): Payer: Self-pay | Admitting: Family Medicine

## 2020-07-30 DIAGNOSIS — M542 Cervicalgia: Secondary | ICD-10-CM | POA: Diagnosis not present

## 2020-07-30 DIAGNOSIS — M79602 Pain in left arm: Secondary | ICD-10-CM | POA: Diagnosis not present

## 2020-07-30 DIAGNOSIS — M545 Low back pain: Secondary | ICD-10-CM | POA: Diagnosis not present

## 2020-07-30 DIAGNOSIS — M546 Pain in thoracic spine: Secondary | ICD-10-CM | POA: Diagnosis not present

## 2020-08-04 DIAGNOSIS — M546 Pain in thoracic spine: Secondary | ICD-10-CM | POA: Diagnosis not present

## 2020-08-04 DIAGNOSIS — M542 Cervicalgia: Secondary | ICD-10-CM | POA: Diagnosis not present

## 2020-08-04 DIAGNOSIS — M545 Low back pain: Secondary | ICD-10-CM | POA: Diagnosis not present

## 2020-08-04 DIAGNOSIS — M79602 Pain in left arm: Secondary | ICD-10-CM | POA: Diagnosis not present

## 2020-08-10 ENCOUNTER — Ambulatory Visit (INDEPENDENT_AMBULATORY_CARE_PROVIDER_SITE_OTHER): Payer: Federal, State, Local not specified - PPO | Admitting: Family Medicine

## 2020-08-10 ENCOUNTER — Encounter (INDEPENDENT_AMBULATORY_CARE_PROVIDER_SITE_OTHER): Payer: Self-pay | Admitting: Family Medicine

## 2020-08-10 ENCOUNTER — Other Ambulatory Visit: Payer: Self-pay

## 2020-08-10 VITALS — BP 135/85 | HR 83 | Temp 98.5°F | Ht 70.0 in | Wt 392.0 lb

## 2020-08-10 DIAGNOSIS — R5383 Other fatigue: Secondary | ICD-10-CM | POA: Diagnosis not present

## 2020-08-10 DIAGNOSIS — Z6841 Body Mass Index (BMI) 40.0 and over, adult: Secondary | ICD-10-CM

## 2020-08-10 DIAGNOSIS — F419 Anxiety disorder, unspecified: Secondary | ICD-10-CM

## 2020-08-10 DIAGNOSIS — R0602 Shortness of breath: Secondary | ICD-10-CM

## 2020-08-10 DIAGNOSIS — I10 Essential (primary) hypertension: Secondary | ICD-10-CM

## 2020-08-10 DIAGNOSIS — F329 Major depressive disorder, single episode, unspecified: Secondary | ICD-10-CM | POA: Diagnosis not present

## 2020-08-10 DIAGNOSIS — Z9189 Other specified personal risk factors, not elsewhere classified: Secondary | ICD-10-CM

## 2020-08-10 DIAGNOSIS — F32A Depression, unspecified: Secondary | ICD-10-CM

## 2020-08-10 DIAGNOSIS — R739 Hyperglycemia, unspecified: Secondary | ICD-10-CM | POA: Diagnosis not present

## 2020-08-10 DIAGNOSIS — Z0289 Encounter for other administrative examinations: Secondary | ICD-10-CM

## 2020-08-10 DIAGNOSIS — E559 Vitamin D deficiency, unspecified: Secondary | ICD-10-CM

## 2020-08-11 DIAGNOSIS — M545 Low back pain: Secondary | ICD-10-CM | POA: Diagnosis not present

## 2020-08-11 DIAGNOSIS — M79602 Pain in left arm: Secondary | ICD-10-CM | POA: Diagnosis not present

## 2020-08-11 DIAGNOSIS — M542 Cervicalgia: Secondary | ICD-10-CM | POA: Diagnosis not present

## 2020-08-11 DIAGNOSIS — M546 Pain in thoracic spine: Secondary | ICD-10-CM | POA: Diagnosis not present

## 2020-08-11 LAB — HEMOGLOBIN A1C
Est. average glucose Bld gHb Est-mCnc: 111 mg/dL
Hgb A1c MFr Bld: 5.5 % (ref 4.8–5.6)

## 2020-08-11 LAB — COMPREHENSIVE METABOLIC PANEL
ALT: 13 IU/L (ref 0–44)
AST: 22 IU/L (ref 0–40)
Albumin/Globulin Ratio: 1.3 (ref 1.2–2.2)
Albumin: 4 g/dL (ref 4.0–5.0)
Alkaline Phosphatase: 91 IU/L (ref 44–121)
BUN/Creatinine Ratio: 15 (ref 9–20)
BUN: 18 mg/dL (ref 6–20)
Bilirubin Total: 0.3 mg/dL (ref 0.0–1.2)
CO2: 27 mmol/L (ref 20–29)
Calcium: 9.2 mg/dL (ref 8.7–10.2)
Chloride: 100 mmol/L (ref 96–106)
Creatinine, Ser: 1.21 mg/dL (ref 0.76–1.27)
GFR calc Af Amer: 91 mL/min/{1.73_m2} (ref >59)
GFR calc non Af Amer: 79 mL/min/{1.73_m2} (ref >59)
Globulin, Total: 3 g/dL (ref 1.5–4.5)
Glucose: 90 mg/dL (ref 65–99)
Potassium: 4.6 mmol/L (ref 3.5–5.2)
Sodium: 140 mmol/L (ref 134–144)
Total Protein: 7 g/dL (ref 6.0–8.5)

## 2020-08-11 LAB — CBC WITH DIFFERENTIAL/PLATELET
Basophils Absolute: 0 10*3/uL (ref 0.0–0.2)
Basos: 0 %
EOS (ABSOLUTE): 0.2 10*3/uL (ref 0.0–0.4)
Eos: 3 %
Hematocrit: 41.6 % (ref 37.5–51.0)
Hemoglobin: 13.8 g/dL (ref 13.0–17.7)
Immature Grans (Abs): 0 10*3/uL (ref 0.0–0.1)
Immature Granulocytes: 0 %
Lymphocytes Absolute: 2.1 10*3/uL (ref 0.7–3.1)
Lymphs: 40 %
MCH: 28 pg (ref 26.6–33.0)
MCHC: 33.2 g/dL (ref 31.5–35.7)
MCV: 85 fL (ref 79–97)
Monocytes Absolute: 0.5 10*3/uL (ref 0.1–0.9)
Monocytes: 9 %
Neutrophils Absolute: 2.5 10*3/uL (ref 1.4–7.0)
Neutrophils: 48 %
Platelets: 355 10*3/uL (ref 150–450)
RBC: 4.92 x10E6/uL (ref 4.14–5.80)
RDW: 13.5 % (ref 11.6–15.4)
WBC: 5.3 10*3/uL (ref 3.4–10.8)

## 2020-08-11 LAB — FOLATE: Folate: 2.6 ng/mL — ABNORMAL LOW (ref >3.0)

## 2020-08-11 LAB — LIPID PANEL WITH LDL/HDL RATIO
Cholesterol, Total: 212 mg/dL — ABNORMAL HIGH (ref 100–199)
HDL: 33 mg/dL — ABNORMAL LOW (ref >39)
LDL Chol Calc (NIH): 158 mg/dL — ABNORMAL HIGH (ref 0–99)
LDL/HDL Ratio: 4.8 ratio — ABNORMAL HIGH (ref 0.0–3.6)
Triglycerides: 114 mg/dL (ref 0–149)
VLDL Cholesterol Cal: 21 mg/dL (ref 5–40)

## 2020-08-11 LAB — VITAMIN B12: Vitamin B-12: 468 pg/mL (ref 232–1245)

## 2020-08-11 LAB — INSULIN, RANDOM: INSULIN: 28.3 u[IU]/mL — ABNORMAL HIGH (ref 2.6–24.9)

## 2020-08-11 LAB — VITAMIN D 25 HYDROXY (VIT D DEFICIENCY, FRACTURES): Vit D, 25-Hydroxy: 24.1 ng/mL — ABNORMAL LOW (ref 30.0–100.0)

## 2020-08-11 LAB — TSH: TSH: 1.02 u[IU]/mL (ref 0.450–4.500)

## 2020-08-11 LAB — T4: T4, Total: 8.3 ug/dL (ref 4.5–12.0)

## 2020-08-11 LAB — T3: T3, Total: 135 ng/dL (ref 71–180)

## 2020-08-17 ENCOUNTER — Encounter: Payer: Self-pay | Admitting: Family Medicine

## 2020-08-17 DIAGNOSIS — F4320 Adjustment disorder, unspecified: Secondary | ICD-10-CM | POA: Diagnosis not present

## 2020-08-17 NOTE — Progress Notes (Signed)
Dear Shawn [name] Mustard, MD,   Thank you for referring Shawn [name] [name] to our clinic. The following note includes my evaluation and treatment recommendations.  Chief Complaint:   Shawn [name] [name] (MR# [id number]) is a 34 y.o. male who presents for evaluation and treatment of Shawn [name] and related comorbidities. Current BMI is Body mass index is 56.25 kg/m. Shawn [name] [name] has been struggling with his weight for many years and has been unsuccessful in either losing weight, maintaining weight loss, or reaching his healthy weight goal.  Shawn [name] [name] is currently in the action stage of change and ready to dedicate time achieving and maintaining a healthier weight. Shawn [name] [name] is interested in becoming our patient and working on intensive lifestyle modifications including (but not limited to) diet and exercise for weight loss.  Shawn [name] [name]'s habits were reviewed today and are as follows: he struggles with family and or coworkers weight loss sabotage, his desired weight loss is 137 lbs, he has been heavy most of his life, he started gaining weight after he stopped playing football, his heaviest weight ever was 396 pounds, he is a picky eater and doesn't like to eat healthier foods, he has significant food cravings issues, he snacks frequently in the evenings, he skips meals frequently, he is frequently drinking liquids with calories, he frequently eats larger portions than normal and he struggles with emotional eating.  Depression Screen Shawn [name] [name]'s Food and Mood (modified PHQ-9) score was 20.  Depression screen PHQ 2/9 08/10/2020  Decreased Interest 2  Down, Depressed, Hopeless 2  PHQ - 2 Score 4  Altered sleeping 3  Tired, decreased energy 3  Change in appetite 2  Feeling bad or failure about yourself  3  Trouble concentrating 3  Moving slowly or fidgety/restless 2  Suicidal thoughts 0  PHQ-9 Score 20  Difficult doing work/chores Very difficult   Subjective:   1. Other fatigue Shawn [name] [name] admits  to daytime somnolence and admits to waking up still tired. Patent has a history of symptoms of daytime fatigue. Shawn [name] [name] generally gets 5 or 6 hours of sleep per night, and states that he has nightime awakenings. Snoring is present. Apneic episodes are not present. Epworth Sleepiness Score is 20. EKG-normal sinus rhythm at 73 BPM. T wave abnormalities in III and aVF but unchanged from EKG in March 2021. Shawn [name] [name] states he repeatedly stops breathing while sleeping.  2. Shortness of breath Shawn [name] [name] notes increasing shortness of breath with exercising and seems to be worsening over time with weight gain. He notes getting out of breath sooner with activity than he used to. This has not gotten worse recently. Shawn [name] [name] denies shortness of breath at rest or orthopnea.  3. Essential hypertension Shawn [name] [name]'s blood pressure is well controlled today. He denies chest pain, chest pressure, or headache.   4. Vitamin D deficiency Shawn [name] [name] denies nausea, vomiting, or muscle weakness, but he notes fatigue. He is on prescription Vit D. Last Vit D level was 11.43.  5. Hyperglycemia Shawn [name] [name] has had elevated blood sugars previously. No A1c in Epic after 2014.  6. Anxiety and depression Shawn [name] [name] is on Venlafaxine XR daily (recent increase in dose). He denies suicidal or homicidal ideas.  7. At risk for obstructive sleep apnea Shawn [name] [name] is at increased risk for sleep apnea due to Shawn [name].   Assessment/Plan:   1. Other fatigue Shawn [name] [name] does feel that his weight is causing his energy to be lower than it should be. Fatigue may be related to Shawn [name], depression or many other causes. Labs will be ordered, and  in the meanwhile, Shawn [name] [name] will focus on self care including making healthy food choices, increasing physical activity and focusing on stress reduction.  - EKG 12-Lead - Vitamin B12 - Folate - T3 - T4 - TSH - Ambulatory referral to Sleep Studies  2. Shortness of breath Shawn [name] [name] does feel that he gets out  of breath more easily that he used to when he exercises. Shawn [name] [name]'s shortness of breath appears to be Shawn [name] related and exercise induced. He has agreed to work on weight loss and gradually increase exercise to treat his exercise induced shortness of breath. Will continue to monitor closely.  - CBC with Differential/Platelet - Lipid Panel With LDL/HDL Ratio - Ambulatory referral to Sleep Studies  3. Essential hypertension Shawn [name] [name] is working on healthy weight loss and exercise to improve blood pressure control. We will watch for signs of hypotension as he continues his lifestyle modifications. EKG was done today, and we will check labs today.  - EKG 12-Lead - Comprehensive metabolic panel - Lipid Panel With LDL/HDL Ratio  4. Vitamin D deficiency Low Vitamin D level contributes to fatigue and are associated with Shawn [name], breast, and colon cancer. We will check labs today. Shawn [name] [name] will follow-up for routine testing of Vitamin D, at least 2-3 times per year to avoid over-replacement.  - VITAMIN D 25 Hydroxy (Vit-D Deficiency, Fractures)  5. Hyperglycemia Fasting labs will be obtained and results with be discussed with Shawn [name] [name] in 2 weeks at his follow up visit. In the meanwhile Shawn [name] [name] was started on a lower simple carbohydrate diet and will work on weight loss efforts.  - Hemoglobin A1c - Insulin, random  6. Anxiety and depression Behavior modification techniques were discussed today to help Shawn [name] [name] deal with his anxiety. We will follow up at his next appointment. Orders and follow up as documented in patient record.   7. At risk for obstructive sleep apnea Shawn [name] [name] was given approximately 15 minutes of coronary artery disease prevention counseling today. He is 34 y.o. male and has risk factors for obstructive sleep apnea including Shawn [name]. We discussed intensive lifestyle modifications today with an emphasis on specific weight loss instructions and strategies.  Repetitive spaced  learning was employed today to elicit superior memory formation and behavioral change.  8. Class 3 severe Shawn [name] with serious comorbidity and body mass index (BMI) of 50.0 to 59.9 in adult, unspecified Shawn [name] type (HCC) Shawn [name] [name] is currently in the action stage of change and his goal is to continue with weight loss efforts. I recommend Shawn [name] [name] begin the structured treatment plan as follows:  He has agreed to the Category 4 Plan.  Exercise goals: No exercise has been prescribed at this time.   Behavioral modification strategies: increasing lean protein intake, meal planning and cooking strategies, keeping healthy foods in the home and planning for success.  He was informed of the importance of frequent follow-up visits to maximize his success with intensive lifestyle modifications for his multiple health conditions. He was informed we would discuss his lab results at his next visit unless there is a critical issue that needs to be addressed sooner. Shawn [name] [name] agreed to keep his next visit at the agreed upon time to discuss these results.  Objective:   Blood pressure 135/85, pulse 83, temperature 98.5 F (36.9 C), temperature source Oral, height 5\' 10"  (1.778 m), weight (!) 392 lb (177.8 kg), SpO2 99 %. Body mass index is 56.25 kg/m.  EKG: Normal sinus rhythm, rate 73 BPM.  Indirect Calorimeter completed today shows a VO2 of 360 and  a REE of 2506.  His calculated basal metabolic rate is 6295 thus his basal metabolic rate is worse than expected.  General: Cooperative, alert, well developed, in no acute distress. HEENT: Conjunctivae and lids unremarkable. Cardiovascular: Regular rhythm.  Lungs: Normal work of breathing. Neurologic: No focal deficits.   Lab Results  Component Value Date   CREATININE 1.21 08/10/2020   BUN 18 08/10/2020   NA 140 08/10/2020   K 4.6 08/10/2020   CL 100 08/10/2020   CO2 27 08/10/2020   Lab Results  Component Value Date   ALT 13 08/10/2020   AST 22  08/10/2020   ALKPHOS 91 08/10/2020   BILITOT 0.3 08/10/2020   Lab Results  Component Value Date   HGBA1C 5.5 08/10/2020   HGBA1C 5.1 12/11/2012   Lab Results  Component Value Date   INSULIN 28.3 (H) 08/10/2020   Lab Results  Component Value Date   TSH 1.020 08/10/2020   Lab Results  Component Value Date   CHOL 212 (H) 08/10/2020   HDL 33 (L) 08/10/2020   LDLCALC 158 (H) 08/10/2020   LDLDIRECT 141 (H) 12/11/2012   TRIG 114 08/10/2020   CHOLHDL 5 03/23/2020   Lab Results  Component Value Date   WBC 5.3 08/10/2020   HGB 13.8 08/10/2020   HCT 41.6 08/10/2020   MCV 85 08/10/2020   PLT 355 08/10/2020   No results found for: IRON, TIBC, FERRITIN  Attestation Statements:   Reviewed by clinician on day of visit: allergies, medications, problem list, medical history, surgical history, family history, social history, and previous encounter notes.   I, Burt Knack, am acting as transcriptionist for Reuben Likes, MD.  This is the patient's first visit at [facility]. The patient's NEW PATIENT PACKET was reviewed at length. Included in the packet: current and past health history, medications, allergies, ROS, gynecologic history (women only), surgical history, family history, social history, weight history, weight loss surgery history (for those that have had weight loss surgery), nutritional evaluation, mood and food questionnaire, PHQ9, Epworth questionnaire, sleep habits questionnaire, patient life and health improvement goals questionnaire. These will all be scanned into the patient's chart under media.   During the visit, I independently reviewed the patient's EKG, bioimpedance scale results, and indirect calorimeter results. I used this information to tailor a meal plan for the patient that will help him to lose weight and will improve his Shawn [name]-related conditions going forward. I performed a medically necessary appropriate examination and/or evaluation. I  discussed the assessment and treatment plan with the patient. The patient was provided an opportunity to ask questions and all were answered. The patient agreed with the plan and demonstrated an understanding of the instructions. Labs were ordered at this visit and will be reviewed at the next visit unless more critical results need to be addressed immediately. Clinical information was updated and documented in the EMR.   Time spent on visit including pre-visit chart review and post-visit care was 45 minutes.   A separate 15 minutes was spent on risk counseling (see above).

## 2020-08-18 DIAGNOSIS — M79602 Pain in left arm: Secondary | ICD-10-CM | POA: Diagnosis not present

## 2020-08-18 DIAGNOSIS — M546 Pain in thoracic spine: Secondary | ICD-10-CM | POA: Diagnosis not present

## 2020-08-18 DIAGNOSIS — M542 Cervicalgia: Secondary | ICD-10-CM | POA: Diagnosis not present

## 2020-08-18 DIAGNOSIS — M545 Low back pain: Secondary | ICD-10-CM | POA: Diagnosis not present

## 2020-08-19 ENCOUNTER — Telehealth: Payer: Self-pay

## 2020-08-19 ENCOUNTER — Encounter: Payer: Self-pay | Admitting: Family Medicine

## 2020-08-19 DIAGNOSIS — R0781 Pleurodynia: Secondary | ICD-10-CM

## 2020-08-19 DIAGNOSIS — M546 Pain in thoracic spine: Secondary | ICD-10-CM

## 2020-08-19 NOTE — Telephone Encounter (Signed)
[facility] [employer] [facility] is requesting a call back about this pt  [phone]

## 2020-08-20 DIAGNOSIS — M542 Cervicalgia: Secondary | ICD-10-CM | POA: Diagnosis not present

## 2020-08-20 DIAGNOSIS — M79602 Pain in left arm: Secondary | ICD-10-CM | POA: Diagnosis not present

## 2020-08-20 DIAGNOSIS — M546 Pain in thoracic spine: Secondary | ICD-10-CM | POA: Diagnosis not present

## 2020-08-20 DIAGNOSIS — M79601 Pain in right arm: Secondary | ICD-10-CM | POA: Diagnosis not present

## 2020-08-20 NOTE — Telephone Encounter (Signed)
I spoke with Shawn [name] to clarify that per Dr. Artis Flock,  pt does not need an xray. She voiced understanding.

## 2020-08-21 NOTE — Telephone Encounter (Signed)
Please Advise

## 2020-08-24 ENCOUNTER — Encounter (INDEPENDENT_AMBULATORY_CARE_PROVIDER_SITE_OTHER): Payer: Self-pay | Admitting: Family Medicine

## 2020-08-24 ENCOUNTER — Ambulatory Visit (INDEPENDENT_AMBULATORY_CARE_PROVIDER_SITE_OTHER): Payer: Federal, State, Local not specified - PPO | Admitting: Family Medicine

## 2020-08-24 ENCOUNTER — Other Ambulatory Visit: Payer: Self-pay

## 2020-08-24 VITALS — BP 121/78 | HR 103 | Temp 98.3°F | Ht 70.0 in | Wt 393.0 lb

## 2020-08-24 DIAGNOSIS — E7849 Other hyperlipidemia: Secondary | ICD-10-CM

## 2020-08-24 DIAGNOSIS — E559 Vitamin D deficiency, unspecified: Secondary | ICD-10-CM

## 2020-08-24 DIAGNOSIS — Z6841 Body Mass Index (BMI) 40.0 and over, adult: Secondary | ICD-10-CM

## 2020-08-24 DIAGNOSIS — E8881 Metabolic syndrome: Secondary | ICD-10-CM | POA: Diagnosis not present

## 2020-08-24 DIAGNOSIS — Z9189 Other specified personal risk factors, not elsewhere classified: Secondary | ICD-10-CM

## 2020-08-24 MED ORDER — VITAMIN D (ERGOCALCIFEROL) 1.25 MG (50000 UNIT) PO CAPS: ORAL_CAPSULE | ORAL | 0 refills | Status: DC

## 2020-08-24 MED ORDER — WEGOVY 0.25 MG/0.5ML ~~LOC~~ SOAJ: 0.2500 mg | SUBCUTANEOUS | 0 refills | Status: DC

## 2020-08-25 DIAGNOSIS — M79601 Pain in right arm: Secondary | ICD-10-CM | POA: Diagnosis not present

## 2020-08-25 DIAGNOSIS — M546 Pain in thoracic spine: Secondary | ICD-10-CM | POA: Diagnosis not present

## 2020-08-25 DIAGNOSIS — M542 Cervicalgia: Secondary | ICD-10-CM | POA: Diagnosis not present

## 2020-08-25 DIAGNOSIS — M79602 Pain in left arm: Secondary | ICD-10-CM | POA: Diagnosis not present

## 2020-08-25 NOTE — Progress Notes (Signed)
Chief Complaint:   OBESITY Shawn [name] is here to discuss his progress with his obesity treatment plan along with follow-up of his obesity related diagnoses. Shawn [name] is on the Category 4 Plan and states he is following his eating plan approximately 0% of the time. Shawn [name] states he is walking 2 miles 2 times per week, and doing physical therapy for 160 minutes 2 times per week.  Today's visit was #: 2 Starting weight: 392 lbs Starting date: 08/10/2020 Today's weight: 393 lbs Today's date: 08/24/2020 Total lbs lost to date: 0 Total lbs lost since last in-office visit: 0  Interim History: Shawn [name] wasn't able to follow the meal plan exactly secondary to lack of funds. He tried to make some changes including drinking more water, eating more salads. He does anticipate on going to the grocery store in the next month.  Subjective:   1. Vitamin D deficiency [name] is on prescription Vit D. Last Vit D level was 24.1 (improved from 11.4). I discussed labs with the patient today.  2. Other hyperlipidemia Shawn [name]'s last LDL was 158, HDL 33, and triglycerides 320. His levels are worsening, and he is not on medications. We can't risk stratify as he is too young. I discussed labs with the patient today.  3. Insulin resistance Shawn [name] has a new diagnosis of insulin resistance. Last A1c was 5.5 and insulin 28.3. He is not on medications. I discussed labs with the patient today.  4. At risk for diabetes mellitus Shawn [name] is at higher than average risk for developing diabetes due to his obesity.   Assessment/Plan:   1. Vitamin D deficiency Low Vitamin D level contributes to fatigue and are associated with obesity, breast, and colon cancer. We will refill prescription Vitamin D for 90 days with no refills. [name] will follow-up for routine testing of Vitamin D, at least 2-3 times per year to avoid over-replacement.  - Vitamin D, Ergocalciferol, (DRISDOL) 1.25 MG (50000 UNIT) CAPS capsule;  TAKE ONE CAPSULE BY MOUTH ONCE A WEEK FOR 12 WEEKS. THEN TAKE 2000IU/DAY  Dispense: 12 capsule; Refill: 0  2. Other hyperlipidemia Cardiovascular risk and specific lipid/LDL goals reviewed. We discussed several lifestyle modifications today. Ahmod will continue to work on diet, exercise and weight loss efforts. We will repeat labs in 3 month, no medications were prescribed at this time. Orders and follow up as documented in patient record.   Counseling Intensive lifestyle modifications are the first line treatment for this issue. . Dietary changes: Increase soluble fiber. Decrease simple carbohydrates. . Exercise changes: Moderate to vigorous-intensity aerobic activity 150 minutes per week if tolerated. . Lipid-lowering medications: see documented in medical record.  3. Insulin resistance [name] will continue to work on weight loss, exercise, and decreasing simple carbohydrates to help decrease the risk of diabetes. We will follow up on labs in 3 months, no medications were prescribed at this time. [name] agreed to follow-up with [place] as directed to closely monitor his progress.  4. At risk for diabetes mellitus Shawn [name] was given approximately 30 minutes of diabetes education and counseling today. We discussed intensive lifestyle modifications today with an emphasis on weight loss as well as increasing exercise and decreasing simple carbohydrates in his diet. We also reviewed medication options with an emphasis on risk versus benefit of those discussed.   Repetitive spaced learning was employed today to elicit superior memory formation and behavioral change.  5. Class 3 severe obesity with serious comorbidity and body mass index (BMI) of 50.0 to 59.9 in  adult, unspecified obesity type (HCC) Shawn [name] is currently in the action stage of change. As such, his goal is to continue with weight loss efforts. He has agreed to keeping a food journal and adhering to recommended goals of 1800-1950  calories and 130+ grams of protein daily.   We discussed various medication options to help [facility] with his weight loss efforts and we both agreed to start Wegovy 0.25 mg SubQ weekly, with no refills.  - Semaglutide-Weight Management (WEGOVY) 0.25 MG/0.5ML SOAJ; Inject 0.25 mg into the skin once a week.  Dispense: 2 mL; Refill: 0  Exercise goals: All adults should avoid inactivity. Some physical activity is better than none, and adults who participate in any amount of physical activity gain some health benefits.  Behavioral modification strategies: increasing lean protein intake, decreasing eating out, planning for success and keeping a strict food journal.  Shawn [name] has agreed to follow-up with our clinic in 2 to 3 weeks. He was informed of the importance of frequent follow-up visits to maximize his success with intensive lifestyle modifications for his multiple health conditions.   Objective:   Blood pressure 121/78, pulse (!) 103, temperature 98.3 F (36.8 C), temperature source Oral, height 5\' 10"  (1.778 m), weight (!) 393 lb (178.3 kg), SpO2 97 %. Body mass index is 56.39 kg/m.  General: Cooperative, alert, well developed, in no acute distress. HEENT: Conjunctivae and lids unremarkable. Cardiovascular: Regular rhythm.  Lungs: Normal work of breathing. Neurologic: No focal deficits.   Lab Results  Component Value Date   CREATININE 1.21 08/10/2020   BUN 18 08/10/2020   NA 140 08/10/2020   K 4.6 08/10/2020   CL 100 08/10/2020   CO2 27 08/10/2020   Lab Results  Component Value Date   ALT 13 08/10/2020   AST 22 08/10/2020   ALKPHOS 91 08/10/2020   BILITOT 0.3 08/10/2020   Lab Results  Component Value Date   HGBA1C 5.5 08/10/2020   HGBA1C 5.1 12/11/2012   Lab Results  Component Value Date   INSULIN 28.3 (H) 08/10/2020   Lab Results  Component Value Date   TSH 1.020 08/10/2020   Lab Results  Component Value Date   CHOL 212 (H) 08/10/2020   HDL 33 (L)  08/10/2020   LDLCALC 158 (H) 08/10/2020   LDLDIRECT 141 (H) 12/11/2012   TRIG 114 08/10/2020   CHOLHDL 5 03/23/2020   Lab Results  Component Value Date   WBC 5.3 08/10/2020   HGB 13.8 08/10/2020   HCT 41.6 08/10/2020   MCV 85 08/10/2020   PLT 355 08/10/2020   No results found for: IRON, TIBC, FERRITIN  Attestation Statements:   Reviewed by clinician on day of visit: allergies, medications, problem list, medical history, surgical history, family history, social history, and previous encounter notes.   I, 08/12/2020, am acting as transcriptionist for Burt Knack, MD.  I have reviewed the above documentation for accuracy and completeness, and I agree with the above. - Reuben Likes, MD

## 2020-08-27 DIAGNOSIS — M79601 Pain in right arm: Secondary | ICD-10-CM | POA: Diagnosis not present

## 2020-08-27 DIAGNOSIS — M546 Pain in thoracic spine: Secondary | ICD-10-CM | POA: Diagnosis not present

## 2020-08-27 DIAGNOSIS — M542 Cervicalgia: Secondary | ICD-10-CM | POA: Diagnosis not present

## 2020-08-27 DIAGNOSIS — M79602 Pain in left arm: Secondary | ICD-10-CM | POA: Diagnosis not present

## 2020-08-28 ENCOUNTER — Ambulatory Visit (INDEPENDENT_AMBULATORY_CARE_PROVIDER_SITE_OTHER)
Admission: RE | Admit: 2020-08-28 | Discharge: 2020-08-28 | Disposition: A | Discharge status: Home / Self Care | Payer: Self-pay | Source: Ambulatory Visit | Attending: Family Medicine | Admitting: Family Medicine

## 2020-08-28 ENCOUNTER — Other Ambulatory Visit: Payer: Self-pay

## 2020-08-28 DIAGNOSIS — R0781 Pleurodynia: Secondary | ICD-10-CM

## 2020-08-28 DIAGNOSIS — M546 Pain in thoracic spine: Secondary | ICD-10-CM
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
## 2020-08-31 ENCOUNTER — Encounter: Payer: Self-pay | Admitting: Family Medicine

## 2020-09-01 DIAGNOSIS — M546 Pain in thoracic spine: Secondary | ICD-10-CM | POA: Diagnosis not present

## 2020-09-01 DIAGNOSIS — M542 Cervicalgia: Secondary | ICD-10-CM | POA: Diagnosis not present

## 2020-09-01 DIAGNOSIS — M79601 Pain in right arm: Secondary | ICD-10-CM | POA: Diagnosis not present

## 2020-09-01 DIAGNOSIS — M79602 Pain in left arm: Secondary | ICD-10-CM | POA: Diagnosis not present

## 2020-09-03 DIAGNOSIS — M79602 Pain in left arm: Secondary | ICD-10-CM | POA: Diagnosis not present

## 2020-09-03 DIAGNOSIS — F4320 Adjustment disorder, unspecified: Secondary | ICD-10-CM | POA: Diagnosis not present

## 2020-09-03 DIAGNOSIS — M79601 Pain in right arm: Secondary | ICD-10-CM | POA: Diagnosis not present

## 2020-09-03 DIAGNOSIS — M546 Pain in thoracic spine: Secondary | ICD-10-CM | POA: Diagnosis not present

## 2020-09-03 DIAGNOSIS — M542 Cervicalgia: Secondary | ICD-10-CM | POA: Diagnosis not present

## 2020-09-15 ENCOUNTER — Encounter (INDEPENDENT_AMBULATORY_CARE_PROVIDER_SITE_OTHER): Payer: Self-pay

## 2020-09-15 ENCOUNTER — Other Ambulatory Visit: Payer: Self-pay

## 2020-09-15 ENCOUNTER — Encounter: Payer: Self-pay | Admitting: Neurology

## 2020-09-15 ENCOUNTER — Ambulatory Visit: Payer: Federal, State, Local not specified - PPO | Admitting: Neurology

## 2020-09-15 ENCOUNTER — Ambulatory Visit (INDEPENDENT_AMBULATORY_CARE_PROVIDER_SITE_OTHER): Payer: Federal, State, Local not specified - PPO | Admitting: Family Medicine

## 2020-09-15 ENCOUNTER — Encounter (INDEPENDENT_AMBULATORY_CARE_PROVIDER_SITE_OTHER): Payer: Self-pay | Admitting: Family Medicine

## 2020-09-15 VITALS — BP 144/89 | HR 93 | Temp 98.5°F | Ht 70.0 in | Wt 395.0 lb

## 2020-09-15 VITALS — BP 171/115 | HR 79 | Ht 70.0 in | Wt 395.0 lb

## 2020-09-15 DIAGNOSIS — M79602 Pain in left arm: Secondary | ICD-10-CM | POA: Diagnosis not present

## 2020-09-15 DIAGNOSIS — Z6841 Body Mass Index (BMI) 40.0 and over, adult: Secondary | ICD-10-CM

## 2020-09-15 DIAGNOSIS — R519 Headache, unspecified: Secondary | ICD-10-CM

## 2020-09-15 DIAGNOSIS — G4719 Other hypersomnia: Secondary | ICD-10-CM

## 2020-09-15 DIAGNOSIS — E7849 Other hyperlipidemia: Secondary | ICD-10-CM | POA: Diagnosis not present

## 2020-09-15 DIAGNOSIS — E559 Vitamin D deficiency, unspecified: Secondary | ICD-10-CM

## 2020-09-15 DIAGNOSIS — Z820 Family history of epilepsy and other diseases of the nervous system: Secondary | ICD-10-CM | POA: Diagnosis not present

## 2020-09-15 DIAGNOSIS — M542 Cervicalgia: Secondary | ICD-10-CM | POA: Diagnosis not present

## 2020-09-15 DIAGNOSIS — R0683 Snoring: Secondary | ICD-10-CM | POA: Diagnosis not present

## 2020-09-15 DIAGNOSIS — M546 Pain in thoracic spine: Secondary | ICD-10-CM | POA: Diagnosis not present

## 2020-09-15 DIAGNOSIS — M79601 Pain in right arm: Secondary | ICD-10-CM | POA: Diagnosis not present

## 2020-09-15 DIAGNOSIS — R0681 Apnea, not elsewhere classified: Secondary | ICD-10-CM

## 2020-09-15 MED ORDER — WEGOVY 0.25 MG/0.5ML ~~LOC~~ SOAJ: 0.2500 mg | SUBCUTANEOUS | 0 refills | Status: DC

## 2020-09-15 NOTE — Patient Instructions (Signed)
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
## 2020-09-16 NOTE — Progress Notes (Signed)
Chief Complaint:   OBESITY Shawn [name] is here to discuss his progress with his obesity treatment plan along with follow-up of his obesity related diagnoses. Shawn [name] is on keeping a food journal and adhering to recommended goals of 1800-1950 calories and 130+ grams of protein daily and states he is following his eating plan approximately 60% of the time. Shawn [name] states he is walking 1.5-2 miles 2 times per week.   Today's visit was #: 3 Starting weight: 392 lbs Starting date: 08/10/2020 Today's weight: 395 lbs Today's date: 09/15/2020 Total lbs lost to date: 0 Total lbs lost since last in-office visit: 0  Interim History: Shawn [name] feels he has had a decrease in app=etite over the last few weeks. Drinking protein shakes and 2 full meals per day. He may get in a salad and salmon, or baked chicken, green beans and rice. He is on the fence on taking medication. He hasn't downloaded MyFitness Pal on his phone. He notes feeling full faster.  Subjective:   1. Vitamin D deficiency Shawn [name] denies nausea, vomiting, or muscle weakness, but notes fatigue. He is on prescription Vit D.  2. Other hyperlipidemia Shawn [name]'s last LDL was 158, HDL 33, triglycerides 114. He is not on medications.  Assessment/Plan:   1. Vitamin D deficiency Low Vitamin D level contributes to fatigue and are associated with obesity, breast, and colon cancer. Shawn [name] agreed to continue taking prescription Vitamin D, no refill needed. He will follow-up for routine testing of Vitamin D, at least 2-3 times per year to avoid over-replacement.  2. Other hyperlipidemia Cardiovascular risk and specific lipid/LDL goals reviewed. We discussed several lifestyle modifications today and Shawn [name] will continue to work on diet, exercise and weight loss efforts. We will repeat labs in 2 months. Orders and follow up as documented in patient record.   Counseling Intensive lifestyle modifications are the first line treatment for this  issue. . Dietary changes: Increase soluble fiber. Decrease simple carbohydrates. . Exercise changes: Moderate to vigorous-intensity aerobic activity 150 minutes per week if tolerated. . Lipid-lowering medications: see documented in medical record.  3. Class 3 severe obesity with serious comorbidity and body mass index (BMI) of 50.0 to 59.9 in adult, unspecified obesity type (HCC) Shawn [name] is currently in the action stage of change. As such, his goal is to continue with weight loss efforts. He has agreed to keeping a food journal and adhering to recommended goals of 1800-1950 calories and 130+ grams of protein daily.   We discussed various medication options to help [name] with his weight loss efforts and we both agreed to start Wegovy 0.25 mg SubQ weekly with no refills.  - Semaglutide-Weight Management (WEGOVY) 0.25 MG/0.5ML SOAJ; Inject 0.25 mg into the skin once a week.  Dispense: 2 mL; Refill: 0  Exercise goals: As is.  Behavioral modification strategies: increasing lean protein intake, meal planning and cooking strategies, keeping healthy foods in the home and planning for success.  Shawn [name] has agreed to follow-up with our clinic in 2 weeks. He was informed of the importance of frequent follow-up visits to maximize his success with intensive lifestyle modifications for his multiple health conditions.   Objective:   Blood pressure (!) 144/89, pulse 93, temperature 98.5 F (36.9 C), temperature source Oral, height 5\' 10"  (1.778 m), weight (!) 359 lb (162.8 kg), SpO2 99 %. Body mass index is 51.51 kg/m.  General: Cooperative, alert, well developed, in no acute distress. HEENT: Conjunctivae and lids unremarkable. Cardiovascular: Regular rhythm.  Lungs: Normal work of  breathing. Neurologic: No focal deficits.   Lab Results  Component Value Date   CREATININE 1.21 08/10/2020   BUN 18 08/10/2020   NA 140 08/10/2020   K 4.6 08/10/2020   CL 100 08/10/2020   CO2 27 08/10/2020    Lab Results  Component Value Date   ALT 13 08/10/2020   AST 22 08/10/2020   ALKPHOS 91 08/10/2020   BILITOT 0.3 08/10/2020   Lab Results  Component Value Date   HGBA1C 5.5 08/10/2020   HGBA1C 5.1 12/11/2012   Lab Results  Component Value Date   INSULIN 28.3 (H) 08/10/2020   Lab Results  Component Value Date   TSH 1.020 08/10/2020   Lab Results  Component Value Date   CHOL 212 (H) 08/10/2020   HDL 33 (L) 08/10/2020   LDLCALC 158 (H) 08/10/2020   LDLDIRECT 141 (H) 12/11/2012   TRIG 114 08/10/2020   CHOLHDL 5 03/23/2020   Lab Results  Component Value Date   WBC 5.3 08/10/2020   HGB 13.8 08/10/2020   HCT 41.6 08/10/2020   MCV 85 08/10/2020   PLT 355 08/10/2020   No results found for: IRON, TIBC, FERRITIN  Attestation Statements:   Reviewed by clinician on day of visit: allergies, medications, problem list, medical history, surgical history, family history, social history, and previous encounter notes.  Time spent on visit including pre-visit chart review and post-visit care and charting was 15 minutes.    I, Burt Knack, am acting as transcriptionist for Reuben Likes, MD. I have reviewed the above documentation for accuracy and completeness, and I agree with the above. - Katherina Mires, MD

## 2020-09-17 ENCOUNTER — Encounter: Payer: Self-pay | Admitting: Family Medicine

## 2020-09-17 ENCOUNTER — Telehealth: Payer: Self-pay

## 2020-09-17 DIAGNOSIS — M79602 Pain in left arm: Secondary | ICD-10-CM | POA: Diagnosis not present

## 2020-09-17 DIAGNOSIS — M546 Pain in thoracic spine: Secondary | ICD-10-CM | POA: Diagnosis not present

## 2020-09-17 DIAGNOSIS — M542 Cervicalgia: Secondary | ICD-10-CM | POA: Diagnosis not present

## 2020-09-17 DIAGNOSIS — M79601 Pain in right arm: Secondary | ICD-10-CM | POA: Diagnosis not present

## 2020-09-17 NOTE — Telephone Encounter (Signed)
LVM for pt to call me back to schedule sleep study  

## 2020-09-24 DIAGNOSIS — M546 Pain in thoracic spine: Secondary | ICD-10-CM | POA: Diagnosis not present

## 2020-09-24 DIAGNOSIS — M542 Cervicalgia: Secondary | ICD-10-CM | POA: Diagnosis not present

## 2020-09-24 DIAGNOSIS — M79601 Pain in right arm: Secondary | ICD-10-CM | POA: Diagnosis not present

## 2020-09-24 DIAGNOSIS — M79602 Pain in left arm: Secondary | ICD-10-CM | POA: Diagnosis not present

## 2020-09-25 DIAGNOSIS — F4320 Adjustment disorder, unspecified: Secondary | ICD-10-CM | POA: Diagnosis not present

## 2020-09-29 ENCOUNTER — Encounter (INDEPENDENT_AMBULATORY_CARE_PROVIDER_SITE_OTHER): Payer: Self-pay | Admitting: Family Medicine

## 2020-09-29 ENCOUNTER — Telehealth: Payer: Self-pay

## 2020-09-29 DIAGNOSIS — M546 Pain in thoracic spine: Secondary | ICD-10-CM | POA: Diagnosis not present

## 2020-09-29 DIAGNOSIS — M542 Cervicalgia: Secondary | ICD-10-CM | POA: Diagnosis not present

## 2020-09-29 DIAGNOSIS — M79602 Pain in left arm: Secondary | ICD-10-CM | POA: Diagnosis not present

## 2020-09-29 DIAGNOSIS — M79601 Pain in right arm: Secondary | ICD-10-CM | POA: Diagnosis not present

## 2020-09-29 NOTE — Telephone Encounter (Signed)
Please address med concern.  The appt has been taken care of.  Thank you.

## 2020-09-29 NOTE — Telephone Encounter (Signed)
LVM for pt to call me back to schedule sleep study  

## 2020-09-30 ENCOUNTER — Encounter: Payer: Self-pay | Admitting: Family Medicine

## 2020-09-30 ENCOUNTER — Ambulatory Visit (INDEPENDENT_AMBULATORY_CARE_PROVIDER_SITE_OTHER): Payer: Self-pay | Admitting: Family Medicine

## 2020-10-01 NOTE — Telephone Encounter (Signed)
Last seen by Dr. Ukleja. 

## 2020-10-05 ENCOUNTER — Telehealth: Payer: Self-pay

## 2020-10-05 NOTE — Telephone Encounter (Signed)
We have attempted to call the patient two times to schedule sleep study.  Patient has been unavailable at the phone numbers we have on file and has not returned our calls. If patient calls back we will schedule them for their sleep study.  

## 2020-10-06 DIAGNOSIS — M79602 Pain in left arm: Secondary | ICD-10-CM | POA: Diagnosis not present

## 2020-10-06 DIAGNOSIS — M79601 Pain in right arm: Secondary | ICD-10-CM | POA: Diagnosis not present

## 2020-10-06 DIAGNOSIS — M546 Pain in thoracic spine: Secondary | ICD-10-CM | POA: Diagnosis not present

## 2020-10-06 DIAGNOSIS — M542 Cervicalgia: Secondary | ICD-10-CM | POA: Diagnosis not present

## 2020-10-07 ENCOUNTER — Telehealth (INDEPENDENT_AMBULATORY_CARE_PROVIDER_SITE_OTHER): Payer: Self-pay

## 2020-10-07 NOTE — Telephone Encounter (Signed)
Spoke with pt, pt aware to call pharmacy with numbers and information from the savings card to pick up the prescription. Charlynne Pander, CMA

## 2020-10-08 DIAGNOSIS — M542 Cervicalgia: Secondary | ICD-10-CM | POA: Diagnosis not present

## 2020-10-08 DIAGNOSIS — M546 Pain in thoracic spine: Secondary | ICD-10-CM | POA: Diagnosis not present

## 2020-10-08 DIAGNOSIS — M79602 Pain in left arm: Secondary | ICD-10-CM | POA: Diagnosis not present

## 2020-10-08 DIAGNOSIS — M79601 Pain in right arm: Secondary | ICD-10-CM | POA: Diagnosis not present

## 2020-10-19 NOTE — Telephone Encounter (Signed)
Please ensure this was taken care of.

## 2020-10-19 NOTE — Telephone Encounter (Signed)
Options message was read by pt on 10/07/20-CAS

## 2020-10-20 DIAGNOSIS — M79601 Pain in right arm: Secondary | ICD-10-CM | POA: Diagnosis not present

## 2020-10-20 DIAGNOSIS — M546 Pain in thoracic spine: Secondary | ICD-10-CM | POA: Diagnosis not present

## 2020-10-20 DIAGNOSIS — M542 Cervicalgia: Secondary | ICD-10-CM | POA: Diagnosis not present

## 2020-10-20 DIAGNOSIS — M79602 Pain in left arm: Secondary | ICD-10-CM | POA: Diagnosis not present

## 2020-10-22 DIAGNOSIS — L649 Androgenic alopecia, unspecified: Secondary | ICD-10-CM | POA: Diagnosis not present

## 2020-10-22 DIAGNOSIS — L819 Disorder of pigmentation, unspecified: Secondary | ICD-10-CM | POA: Diagnosis not present

## 2020-10-22 DIAGNOSIS — L299 Pruritus, unspecified: Secondary | ICD-10-CM | POA: Diagnosis not present

## 2020-10-23 ENCOUNTER — Encounter: Payer: Self-pay | Admitting: Family Medicine

## 2020-10-23 DIAGNOSIS — L649 Androgenic alopecia, unspecified: Secondary | ICD-10-CM | POA: Insufficient documentation

## 2020-10-23 DIAGNOSIS — F4320 Adjustment disorder, unspecified: Secondary | ICD-10-CM | POA: Diagnosis not present

## 2020-10-26 ENCOUNTER — Ambulatory Visit (INDEPENDENT_AMBULATORY_CARE_PROVIDER_SITE_OTHER): Payer: Federal, State, Local not specified - PPO | Admitting: Neurology

## 2020-10-26 DIAGNOSIS — G4733 Obstructive sleep apnea (adult) (pediatric): Secondary | ICD-10-CM

## 2020-10-26 DIAGNOSIS — R0683 Snoring: Secondary | ICD-10-CM

## 2020-10-26 DIAGNOSIS — G4719 Other hypersomnia: Secondary | ICD-10-CM

## 2020-10-26 DIAGNOSIS — R519 Headache, unspecified: Secondary | ICD-10-CM

## 2020-10-26 DIAGNOSIS — R0681 Apnea, not elsewhere classified: Secondary | ICD-10-CM

## 2020-10-26 DIAGNOSIS — Z820 Family history of epilepsy and other diseases of the nervous system: Secondary | ICD-10-CM

## 2020-10-27 DIAGNOSIS — M79602 Pain in left arm: Secondary | ICD-10-CM | POA: Diagnosis not present

## 2020-10-27 DIAGNOSIS — M546 Pain in thoracic spine: Secondary | ICD-10-CM | POA: Diagnosis not present

## 2020-10-27 DIAGNOSIS — M542 Cervicalgia: Secondary | ICD-10-CM | POA: Diagnosis not present

## 2020-10-27 DIAGNOSIS — M79601 Pain in right arm: Secondary | ICD-10-CM | POA: Diagnosis not present

## 2020-11-05 DIAGNOSIS — M79602 Pain in left arm: Secondary | ICD-10-CM | POA: Diagnosis not present

## 2020-11-05 DIAGNOSIS — M546 Pain in thoracic spine: Secondary | ICD-10-CM | POA: Diagnosis not present

## 2020-11-05 DIAGNOSIS — M542 Cervicalgia: Secondary | ICD-10-CM | POA: Diagnosis not present

## 2020-11-05 DIAGNOSIS — M79601 Pain in right arm: Secondary | ICD-10-CM | POA: Diagnosis not present

## 2020-11-12 DIAGNOSIS — M79601 Pain in right arm: Secondary | ICD-10-CM | POA: Diagnosis not present

## 2020-11-12 DIAGNOSIS — M79602 Pain in left arm: Secondary | ICD-10-CM | POA: Diagnosis not present

## 2020-11-12 DIAGNOSIS — M542 Cervicalgia: Secondary | ICD-10-CM | POA: Diagnosis not present

## 2020-11-12 DIAGNOSIS — M546 Pain in thoracic spine: Secondary | ICD-10-CM | POA: Diagnosis not present

## 2020-11-15 NOTE — Procedures (Signed)
   [facility]  HOME SLEEP TEST (Watch PAT)  STUDY DATE: 10/26/20  DOB: 09-11-1986  MRN: [id number]  ORDERING CLINICIAN: Huston Foley, MD, PhD   REFERRING CLINICIAN: Dr. Rinaldo Ratel   CLINICAL INFORMATION/HISTORY: 34 year old man with a history of hypertension, back pain, allergies, anxiety, depression, knee pain, vitamin D deficiency and morbid obesity with a BMI of over 50, who reports snoring and excessive daytime somnolence.  Epworth sleepiness score: 12/24.  BMI: 56.5 kg/m  FINDINGS:   Total Record Time (hours, min): 8 H 55 min  Total Sleep Time (hours, min):  6 H 47 min   Percent REM (%):    12.53 %   Calculated pAHI (per hour):   7.6      REM pAHI:    18.4     NREM pAHI: 6.2   Oxygen Saturation (%) Mean: 96  Minimum oxygen saturation (%):        88   O2 Saturation Range (%): 88-100  O2Saturation (minutes) <=88%: 0 min   Pulse Mean (bpm):    72  Pulse Range (49-135)   IMPRESSION: OSA (obstructive sleep apnea), mild   RECOMMENDATION:  This home sleep test demonstrates overall mild obstructive sleep apnea with a total AHI of 7.6/hour and O2 nadir of 88%. Mild to moderate snoring was noted. Given the patient's medical history and sleep related complaints, treatment with positive airway pressure is recommended. This can be achieved in the form of autoPAP trial/titration at home. A full night CPAP titration study will help with proper treatment settings and mask fitting, if needed, down the road. Alternative treatments include weight loss along with avoidance of the supine sleep position, or an oral appliance in appropriate candidates.   Please note that untreated obstructive sleep apnea may carry additional perioperative morbidity. Patients with significant obstructive sleep apnea should receive perioperative PAP therapy and the surgeons and particularly the anesthesiologist should be informed of the diagnosis and the severity of the sleep disordered  breathing. The patient should be cautioned not to drive, work at heights, or operate dangerous or heavy equipment when tired or sleepy. Review and reiteration of good sleep hygiene measures should be pursued with any patient. Other causes of the patient's symptoms, including circadian rhythm disturbances, an underlying mood disorder, medication effect and/or an underlying medical problem cannot be ruled out based on this test. Clinical correlation is recommended.   The patient and his referring provider will be notified of the test results. The patient will be seen in follow up in sleep clinic at [facility].  I certify that I have reviewed the raw data recording prior to the issuance of this report in accordance with the standards of the American Academy of Sleep Medicine (AASM).  INTERPRETING PHYSICIAN:    Huston Foley, MD, PhD  Board Certified in Neurology and Sleep Medicine [facility] [address], [address] [place], [place] [address] [phone]

## 2020-11-15 NOTE — Addendum Note (Signed)
Addended by: Huston Foley on: 11/15/2020 11:48 AM   Modules accepted: Orders

## 2020-11-15 NOTE — Progress Notes (Signed)
Patient referred by Dr. Rinaldo Ratel, seen by me on 09/15/20, HST on 10/26/20.    Please call and notify the patient that the recent home sleep test showed obstructive sleep apnea. OSA is overall mild, but worth treating to see if he feels better after treatment and has more success with wt loss. To that end I recommend treatment for this in the form of autoPAP, which means, that we don't have to bring him in for a sleep study with CPAP, but will let him try an autoPAP machine at home, through a DME company (of his choice, or as per insurance requirement). The DME representative will educate him on how to use the machine, how to put the mask on, etc. I have placed an order in the chart. Please send referral, talk to patient, send report to referring MD. We will need a FU in sleep clinic for 10 weeks post-PAP set up, please arrange that with me or one of our NPs. Thanks,   Huston Foley, MD, PhD [facility])

## 2020-11-16 ENCOUNTER — Encounter: Payer: Self-pay | Admitting: Family Medicine

## 2020-11-16 ENCOUNTER — Telehealth: Payer: Self-pay | Admitting: Neurology

## 2020-11-16 DIAGNOSIS — G4733 Obstructive sleep apnea (adult) (pediatric): Secondary | ICD-10-CM | POA: Insufficient documentation

## 2020-11-16 NOTE — Telephone Encounter (Signed)
-----   Message from Huston Foley, MD sent at 11/15/2020 11:48 AM EST ----- Patient referred by Dr. Rinaldo Ratel, seen by me on 09/15/20, HST on 10/26/20.    Please call and notify the patient that the recent home sleep test showed obstructive sleep apnea. OSA is overall mild, but worth treating to see if he feels better after treatment and has more success with wt loss. To that end I recommend treatment for this in the form of autoPAP, which means, that we don't have to bring him in for a sleep study with CPAP, but will let him try an autoPAP machine at home, through a DME company (of his choice, or as per insurance requirement). The DME representative will educate him on how to use the machine, how to put the mask on, etc. I have placed an order in the chart. Please send referral, talk to patient, send report to referring MD. We will need a FU in sleep clinic for 10 weeks post-PAP set up, please arrange that with me or one of our NPs. Thanks,   Huston Foley, MD, PhD [facility])

## 2020-11-16 NOTE — Telephone Encounter (Signed)
I called pt. I advised pt that Dr. Frances Furbish reviewed their sleep study results and found that pt has mild sleep apnea. Dr. Frances Furbish recommends that pt starts auto CPAP. I reviewed PAP compliance expectations with the pt. Pt is agreeable to starting a CPAP. I advised pt that an order will be sent to a DME, [facility] and [facility] will call the pt within about one week after they file with the pt's insurance. [facility]) will show the pt how to use the machine, fit for masks, and troubleshoot the CPAP if needed. A follow up appt will need to made for insurance purposes with Dr. Frances Furbish or NP. Pt verbalized understanding to A letter with all of this information in it will be mailed to the pt as a reminder. I verified with the pt that the address we have on file is correct. Pt verbalized understanding of results. Pt had no questions at this time but was encouraged to call back if questions arise. I have sent the order to [facility]) and have received confirmation that they have received the order.

## 2020-11-17 DIAGNOSIS — M546 Pain in thoracic spine: Secondary | ICD-10-CM | POA: Diagnosis not present

## 2020-11-17 DIAGNOSIS — M79602 Pain in left arm: Secondary | ICD-10-CM | POA: Diagnosis not present

## 2020-11-17 DIAGNOSIS — M542 Cervicalgia: Secondary | ICD-10-CM | POA: Diagnosis not present

## 2020-11-17 DIAGNOSIS — M79601 Pain in right arm: Secondary | ICD-10-CM | POA: Diagnosis not present

## 2020-11-18 ENCOUNTER — Other Ambulatory Visit (INDEPENDENT_AMBULATORY_CARE_PROVIDER_SITE_OTHER): Payer: Self-pay | Admitting: Family Medicine

## 2020-11-18 DIAGNOSIS — E559 Vitamin D deficiency, unspecified: Secondary | ICD-10-CM

## 2020-11-23 ENCOUNTER — Other Ambulatory Visit: Payer: Self-pay

## 2020-11-23 ENCOUNTER — Encounter: Payer: Self-pay | Admitting: Family Medicine

## 2020-11-23 MED ORDER — LISINOPRIL 40 MG PO TABS: 40.0000 mg | ORAL_TABLET | Freq: Every day | ORAL | 1 refills | Status: DC

## 2020-11-30 ENCOUNTER — Other Ambulatory Visit: Payer: Self-pay

## 2020-11-30 ENCOUNTER — Encounter: Payer: Self-pay | Admitting: Family Medicine

## 2020-11-30 ENCOUNTER — Ambulatory Visit: Payer: Federal, State, Local not specified - PPO | Admitting: Family Medicine

## 2020-11-30 VITALS — BP 178/100 | HR 95 | Temp 98.1°F | Resp 18 | Ht 70.0 in | Wt >= 6400 oz

## 2020-11-30 DIAGNOSIS — F339 Major depressive disorder, recurrent, unspecified: Secondary | ICD-10-CM

## 2020-11-30 DIAGNOSIS — I10 Essential (primary) hypertension: Secondary | ICD-10-CM | POA: Diagnosis not present

## 2020-11-30 DIAGNOSIS — F4310 Post-traumatic stress disorder, unspecified: Secondary | ICD-10-CM | POA: Diagnosis not present

## 2020-11-30 DIAGNOSIS — F4320 Adjustment disorder, unspecified: Secondary | ICD-10-CM | POA: Diagnosis not present

## 2020-11-30 MED ORDER — CYCLOBENZAPRINE HCL 10 MG PO TABS: 10.0000 mg | ORAL_TABLET | Freq: Three times a day (TID) | ORAL | 1 refills | Status: DC | PRN

## 2020-11-30 MED ORDER — SERTRALINE HCL 50 MG PO TABS: 50.0000 mg | ORAL_TABLET | Freq: Every day | ORAL | 3 refills | Status: DC

## 2020-11-30 MED ORDER — FAMOTIDINE 20 MG PO TABS: 20.0000 mg | ORAL_TABLET | Freq: Two times a day (BID) | ORAL | 0 refills | Status: DC
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
## 2020-11-30 NOTE — Patient Instructions (Addendum)
1) blood pressure is high! Continue meds. You have refills on bottles!  2) for your food getting stuck in your throat, it sounds like you have some reflux. I want you to do a trial of over the counter pepcid. It is 20mg  twice a day. Sent in px, but it's over the counter. Really try to work on diet and cut out extra caffiene/pepsi.   3) for your depression/anxiety/ptsd. Since effexor does not seem to be working, let's wean off of this. Go down to 37.5mg  daily x 1-2 weeks then can stop and start the zoloft at 50mg /day. After 2-3 weeks can increase the zoloft to 100mg /day if you like it. Can make anxiety worse the first 2 weeks, but hang tight because it gets better. See you back in 2 months!  4) refilled your muscle relaxer.

## 2020-12-02 ENCOUNTER — Encounter: Payer: Self-pay | Admitting: Family Medicine

## 2020-12-10 ENCOUNTER — Other Ambulatory Visit: Payer: Self-pay | Admitting: Family Medicine

## 2020-12-10 ENCOUNTER — Encounter: Payer: Self-pay | Admitting: Family Medicine

## 2020-12-10 DIAGNOSIS — M79602 Pain in left arm: Secondary | ICD-10-CM | POA: Diagnosis not present

## 2020-12-10 DIAGNOSIS — M546 Pain in thoracic spine: Secondary | ICD-10-CM | POA: Diagnosis not present

## 2020-12-10 DIAGNOSIS — M542 Cervicalgia: Secondary | ICD-10-CM | POA: Diagnosis not present

## 2020-12-10 DIAGNOSIS — M79601 Pain in right arm: Secondary | ICD-10-CM | POA: Diagnosis not present

## 2020-12-16 ENCOUNTER — Ambulatory Visit: Payer: Federal, State, Local not specified - PPO | Admitting: Family Medicine

## 2020-12-16 ENCOUNTER — Other Ambulatory Visit: Payer: Self-pay | Admitting: Family Medicine

## 2020-12-23 ENCOUNTER — Other Ambulatory Visit: Payer: Self-pay | Admitting: Family Medicine

## 2020-12-29 ENCOUNTER — Other Ambulatory Visit: Payer: Self-pay

## 2020-12-29 ENCOUNTER — Ambulatory Visit: Payer: Federal, State, Local not specified - PPO | Admitting: Family Medicine

## 2020-12-29 ENCOUNTER — Ambulatory Visit: Payer: Self-pay

## 2020-12-29 DIAGNOSIS — M25512 Pain in left shoulder: Secondary | ICD-10-CM

## 2020-12-29 NOTE — Progress Notes (Signed)
   Office Visit Note   Patient: Shawn [name]           Date of Birth: [birth year]           MRN: [id number] Visit Date: 12/29/2020 Requested by: [name], MD [address] [place],  [place] [address] PCP: [name], MD  Subjective: Chief Complaint  Patient presents with  . Left Shoulder - Pain    HPI: He is here with ongoing left shoulder pain.  He is in for an [facility] joint injection.              ROS:   All other systems were reviewed and are negative.  Objective: Vital Signs: There were no vitals taken for this visit.  Physical Exam:  General:  Alert and oriented, in no acute distress. Pulm:  Breathing unlabored. Psy:  Normal mood, congruent affect.  Right shoulder remains point tender over the [facility] joint.  Imaging: US Guided Needle Placement - No Linked Charges  Result Date: 12/29/2020 Ultrasound-guided left AC injection: After sterile prep with Betadine, injected 4 cc 0.25% bupivacaine and 6 mg betamethasone into the [facility] joint using out of plane technique.   Assessment & Plan: 1.  Left AC arthropathy - Injection as above.  Return as needed.     Procedures: No procedures performed        PMFS History: Patient Active Problem List   Diagnosis Date Noted  . OSA (obstructive sleep apnea) 11/16/2020  . Male pattern alopecia 10/23/2020  . Vitamin D deficiency 03/25/2020  . Essential hypertension 03/23/2020  . PTSD (post-traumatic stress disorder) 03/23/2020  . Multiple fractures of ribs, left side, initial encounter for closed fracture 02/04/2020  . Osteoarthritis of knee 11/07/2018  . Hyperlipidemia 10/24/2018   Past Medical History:  Diagnosis Date  . Allergy   . Anxiety   . Back pain   . Chest pain   . Depression   . Hypertension   . Joint pain   . Knee pain   . Pain in both feet   . Shortness of breath   . Shoulder pain     Family History  Problem Relation Age of Onset  . Cataracts Maternal Grandfather   . Arthritis Mother   .  Sleep apnea Mother   . Obesity Mother   . Diabetes Maternal Aunt   . Hypertension Maternal Aunt   . Diabetes Maternal Uncle     No past surgical history on file. Social History   Occupational History  . Occupation: Firefighter  Tobacco Use  . Smoking status: Never Smoker  . Smokeless tobacco: Never Used  Vaping Use  . Vaping Use: Never used  Substance and Sexual Activity  . Alcohol use: Yes  . Drug use: No  . Sexual activity: Yes    Birth control/protection: Condom

## 2020-12-31 DIAGNOSIS — M79601 Pain in right arm: Secondary | ICD-10-CM | POA: Diagnosis not present

## 2020-12-31 DIAGNOSIS — M546 Pain in thoracic spine: Secondary | ICD-10-CM | POA: Diagnosis not present

## 2020-12-31 DIAGNOSIS — M79602 Pain in left arm: Secondary | ICD-10-CM | POA: Diagnosis not present

## 2020-12-31 DIAGNOSIS — M542 Cervicalgia: Secondary | ICD-10-CM | POA: Diagnosis not present

## 2021-01-01 ENCOUNTER — Other Ambulatory Visit: Payer: Self-pay | Admitting: Family Medicine

## 2021-01-05 ENCOUNTER — Encounter (INDEPENDENT_AMBULATORY_CARE_PROVIDER_SITE_OTHER): Payer: Self-pay | Admitting: Family Medicine

## 2021-01-05 ENCOUNTER — Other Ambulatory Visit: Payer: Self-pay

## 2021-01-05 ENCOUNTER — Ambulatory Visit (INDEPENDENT_AMBULATORY_CARE_PROVIDER_SITE_OTHER): Payer: Federal, State, Local not specified - PPO | Admitting: Family Medicine

## 2021-01-05 VITALS — BP 135/89 | HR 84 | Temp 98.5°F | Ht 70.0 in | Wt 394.0 lb

## 2021-01-05 DIAGNOSIS — M79602 Pain in left arm: Secondary | ICD-10-CM | POA: Diagnosis not present

## 2021-01-05 DIAGNOSIS — Z6841 Body Mass Index (BMI) 40.0 and over, adult: Secondary | ICD-10-CM | POA: Diagnosis not present

## 2021-01-05 DIAGNOSIS — E559 Vitamin D deficiency, unspecified: Secondary | ICD-10-CM

## 2021-01-05 DIAGNOSIS — M546 Pain in thoracic spine: Secondary | ICD-10-CM | POA: Diagnosis not present

## 2021-01-05 DIAGNOSIS — M79601 Pain in right arm: Secondary | ICD-10-CM | POA: Diagnosis not present

## 2021-01-05 DIAGNOSIS — E8881 Metabolic syndrome: Secondary | ICD-10-CM

## 2021-01-05 DIAGNOSIS — M542 Cervicalgia: Secondary | ICD-10-CM | POA: Diagnosis not present

## 2021-01-06 ENCOUNTER — Encounter (INDEPENDENT_AMBULATORY_CARE_PROVIDER_SITE_OTHER): Payer: Self-pay

## 2021-01-06 ENCOUNTER — Encounter: Payer: Self-pay | Admitting: Family Medicine

## 2021-01-06 ENCOUNTER — Encounter: Payer: Self-pay | Admitting: Neurology

## 2021-01-06 NOTE — Progress Notes (Signed)
Chief Complaint:   OBESITY Shawn [name] is here to discuss his progress with his obesity treatment plan along with follow-up of his obesity related diagnoses. Shawn [name] is on keeping a food journal and adhering to recommended goals of 1800-1950 calories and 130 g protein and states he is following his eating plan approximately 0% of the time. Shawn [name] states he is doing PT 60 minutes 2 times per week.  Today's visit was #: 4 Starting weight: 392 lbs Starting date: 08/10/2020 Today's weight: 394 lbs Today's date: 01/05/2021 Total lbs lost to date: 0 Total lbs lost since last in-office visit: 1 lb  Interim History: Pt has had many obstacles in the last few months. He got COVID and his grandfather got diagnosed with prostate cancer. He has struggled with compliance to journaling. Pt thinks he may be interested in surgical option for weight loss. Pt is not feeling entirely sure what he wants to do meal plan wise.   Subjective:   1. Insulin resistance Pt's last A1c was 5.5 with an insulin level of 28.3. He couldn't get GLP-1 at last appointment.  Lab Results  Component Value Date   INSULIN 28.3 (H) 08/10/2020   Lab Results  Component Value Date   HGBA1C 5.5 08/10/2020    2. Vitamin D deficiency Pt denies nausea, vomiting, and muscle weakness but notes fatigue. Pt is on prescription Vit D. His last Vit D level was 24.1.  Assessment/Plan:   1. Insulin resistance Foster will continue to work on weight loss, exercise, and decreasing simple carbohydrates to help decrease the risk of diabetes. Maan agreed to follow-up with [place] as directed to closely monitor his progress.  2. Vitamin D deficiency Low Vitamin D level contributes to fatigue and are associated with obesity, breast, and colon cancer. He agrees to continue to take prescription Vitamin D @50 ,000 IU every week and will follow-up for routine testing of Vitamin D, at least 2-3 times per year to avoid over-replacement. Pt  denies need for refill.  3. Class 3 severe obesity with serious comorbidity and body mass index (BMI) of 50.0 to 59.9 in adult, unspecified obesity type (HCC) Alexandra is currently in the action stage of change. As such, his goal is to continue with weight loss efforts. He has agreed to the Category 4 Plan with protein substitutions.   Refer to [facility] for bariatric evaluation.  Exercise goals: All adults should avoid inactivity. Some physical activity is better than none, and adults who participate in any amount of physical activity gain some health benefits.  Behavioral modification strategies: increasing lean protein intake, meal planning and cooking strategies, keeping healthy foods in the home and keeping a strict food journal.  Winford has agreed to follow-up with our clinic in 2 weeks. He was informed of the importance of frequent follow-up visits to maximize his success with intensive lifestyle modifications for his multiple health conditions.   Objective:   Blood pressure 135/89, pulse 84, temperature 98.5 F (36.9 C), temperature source Oral, height 5\' 10"  (1.778 m), weight (!) 394 lb (178.7 kg), SpO2 99 %. Body mass index is 56.53 kg/m.  General: Cooperative, alert, well developed, in no acute distress. HEENT: Conjunctivae and lids unremarkable. Cardiovascular: Regular rhythm.  Lungs: Normal work of breathing. Neurologic: No focal deficits.   Lab Results  Component Value Date   CREATININE 1.21 08/10/2020   BUN 18 08/10/2020   NA 140 08/10/2020   K 4.6 08/10/2020   CL 100 08/10/2020   CO2  27 08/10/2020   Lab Results  Component Value Date   ALT 13 08/10/2020   AST 22 08/10/2020   ALKPHOS 91 08/10/2020   BILITOT 0.3 08/10/2020   Lab Results  Component Value Date   HGBA1C 5.5 08/10/2020   HGBA1C 5.1 12/11/2012   Lab Results  Component Value Date   INSULIN 28.3 (H) 08/10/2020   Lab Results  Component Value Date   TSH 1.020 08/10/2020    Lab Results  Component Value Date   CHOL 212 (H) 08/10/2020   HDL 33 (L) 08/10/2020   LDLCALC 158 (H) 08/10/2020   LDLDIRECT 141 (H) 12/11/2012   TRIG 114 08/10/2020   CHOLHDL 5 03/23/2020   Lab Results  Component Value Date   WBC 5.3 08/10/2020   HGB 13.8 08/10/2020   HCT 41.6 08/10/2020   MCV 85 08/10/2020   PLT 355 08/10/2020    Attestation Statements:   Reviewed by clinician on day of visit: allergies, medications, problem list, medical history, surgical history, family history, social history, and previous encounter notes.  Edmund Hilda, am acting as transcriptionist for Reuben Likes, MD.   I have reviewed the above documentation for accuracy and completeness, and I agree with the above. - Katherina Mires, MD

## 2021-01-12 DIAGNOSIS — M546 Pain in thoracic spine: Secondary | ICD-10-CM | POA: Diagnosis not present

## 2021-01-12 DIAGNOSIS — M79602 Pain in left arm: Secondary | ICD-10-CM | POA: Diagnosis not present

## 2021-01-12 DIAGNOSIS — M79601 Pain in right arm: Secondary | ICD-10-CM | POA: Diagnosis not present

## 2021-01-12 DIAGNOSIS — M542 Cervicalgia: Secondary | ICD-10-CM | POA: Diagnosis not present

## 2021-01-14 DIAGNOSIS — M542 Cervicalgia: Secondary | ICD-10-CM | POA: Diagnosis not present

## 2021-01-14 DIAGNOSIS — M79601 Pain in right arm: Secondary | ICD-10-CM | POA: Diagnosis not present

## 2021-01-14 DIAGNOSIS — M546 Pain in thoracic spine: Secondary | ICD-10-CM | POA: Diagnosis not present

## 2021-01-14 DIAGNOSIS — M79602 Pain in left arm: Secondary | ICD-10-CM | POA: Diagnosis not present

## 2021-01-21 DIAGNOSIS — M79602 Pain in left arm: Secondary | ICD-10-CM | POA: Diagnosis not present

## 2021-01-21 DIAGNOSIS — M542 Cervicalgia: Secondary | ICD-10-CM | POA: Diagnosis not present

## 2021-01-21 DIAGNOSIS — M79601 Pain in right arm: Secondary | ICD-10-CM | POA: Diagnosis not present

## 2021-01-21 DIAGNOSIS — M546 Pain in thoracic spine: Secondary | ICD-10-CM | POA: Diagnosis not present

## 2021-01-26 ENCOUNTER — Encounter (INDEPENDENT_AMBULATORY_CARE_PROVIDER_SITE_OTHER): Payer: Self-pay | Admitting: Adult Health

## 2021-01-26 ENCOUNTER — Ambulatory Visit (INDEPENDENT_AMBULATORY_CARE_PROVIDER_SITE_OTHER): Payer: Federal, State, Local not specified - PPO | Admitting: Adult Health

## 2021-01-26 ENCOUNTER — Other Ambulatory Visit: Payer: Self-pay

## 2021-01-26 VITALS — BP 175/105 | HR 72 | Temp 98.0°F | Ht 70.0 in | Wt 396.0 lb

## 2021-01-26 DIAGNOSIS — I10 Essential (primary) hypertension: Secondary | ICD-10-CM | POA: Diagnosis not present

## 2021-01-26 DIAGNOSIS — E66813 Obesity, class 3: Secondary | ICD-10-CM

## 2021-01-26 DIAGNOSIS — Z6841 Body Mass Index (BMI) 40.0 and over, adult: Secondary | ICD-10-CM | POA: Insufficient documentation

## 2021-01-27 ENCOUNTER — Telehealth (INDEPENDENT_AMBULATORY_CARE_PROVIDER_SITE_OTHER): Payer: Self-pay
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
## 2021-01-27 NOTE — Telephone Encounter (Signed)
Called pt re status check after elevated B/P in office yesterday 01/26/2021. Pt states that he is "feeling fine." Denies any cardiac symptoms,h/a, or dizziness. Pt stated that he did take B/P medication yesterday afternoon and this AM but did not go to urgent care as instructed. Also states he is unable to check B/P at home due to cuff he has being to small. Pt was advised to see about getting a larger cuff and to make sure that he keeps his appointment with his PCP scheduled for 01/28/21. Pt verbalized understanding. 2 week f/u appointment was scheduled with [place] before getting off phone. Pt is aware of appointment date and time.   Kendall Justo LPN

## 2021-01-28 ENCOUNTER — Ambulatory Visit: Payer: Federal, State, Local not specified - PPO | Admitting: Family Medicine

## 2021-01-28 ENCOUNTER — Other Ambulatory Visit: Payer: Self-pay

## 2021-01-28 ENCOUNTER — Encounter: Payer: Self-pay | Admitting: Family Medicine

## 2021-01-28 VITALS — BP 152/99 | HR 83 | Temp 98.6°F | Ht 70.0 in | Wt 395.2 lb

## 2021-01-28 DIAGNOSIS — I10 Essential (primary) hypertension: Secondary | ICD-10-CM | POA: Diagnosis not present

## 2021-01-28 DIAGNOSIS — F419 Anxiety disorder, unspecified: Secondary | ICD-10-CM | POA: Diagnosis not present

## 2021-01-28 DIAGNOSIS — F32A Depression, unspecified: Secondary | ICD-10-CM

## 2021-01-28 DIAGNOSIS — F339 Major depressive disorder, recurrent, unspecified: Secondary | ICD-10-CM

## 2021-01-28 HISTORY — DX: Major depressive disorder, recurrent, unspecified: F33.9

## 2021-01-28 MED ORDER — SERTRALINE HCL 100 MG PO TABS: 100.0000 mg | ORAL_TABLET | Freq: Every day | ORAL | 1 refills | Status: DC

## 2021-01-28 MED ORDER — AMLODIPINE BESYLATE 5 MG PO TABS: 5.0000 mg | ORAL_TABLET | Freq: Every day | ORAL | 3 refills | Status: DC

## 2021-01-28 NOTE — Patient Instructions (Signed)
-  adding on norvasc 5mg /day to get your blood pressure under better control. Keep log at home to watch.   -increased your zoloft to 100mg /day to help short fuse/depression.   Really try to work on taking pills on consistent basis.  Will see you back in 1 month!   Aw

## 2021-01-28 NOTE — Progress Notes (Signed)
Patient: Shawn [name] MRN: [id number] DOB: 01/16/1986 PCP: [name], MD     Subjective:  Chief Complaint  Patient presents with  . Depression  . Anxiety  . Hypertension    HPI: The patient is a 35 y.o. male who presents today for Depression/Anxiety/HTN.  Hypertension: Here for follow up of hypertension.  Currently on hctz 24m daily and lisinopril 40mg /day. Does not take bp at home, he does have one coming in the mail.  Does not take medication as prescribed and denies any side effects. He forgets to take on the days he has to go into the office. Exercise includes walking. Weight has been increasing steadily. Denies any chest pain, headaches, shortness of breath, vision changes, swelling in lower extremities.   Depression/anxiety  I had him wean off effexor and put him on zoloft at our last visit. He is on zoloft 50mg /day. He can't really tell a difference on this medication. He states he doesn't have the energy a lot of days and it's hard for him to concentrate. He feels like his fuse is a lot shorter. Doesn't take his pill daily and misses them on days he has to go to office. He doesn't really do much exercise.    Review of Systems  Constitutional: Positive for fatigue. Negative for chills and fever.  Respiratory: Negative for cough, shortness of breath and wheezing.   Cardiovascular: Negative for chest pain, palpitations and leg swelling.  Gastrointestinal: Negative for abdominal pain, diarrhea, nausea and vomiting.  Neurological: Positive for headaches.    Allergies Patient has No Known Allergies.  Past Medical History Patient  has a past medical history of Allergy, Anxiety, Back pain, Chest pain, Depression, Hypertension, Joint pain, Knee pain, Pain in both feet, Shortness of breath, and Shoulder pain.  Surgical History Patient  has no past surgical history on file.  Family History Pateint's family history includes Arthritis in his mother; Cataracts in his  maternal grandfather; Diabetes in his maternal aunt and maternal uncle; Hypertension in his maternal aunt; Obesity in his mother; Sleep apnea in his mother.  Social History Patient  reports that he has never smoked. He has never used smokeless tobacco. He reports current alcohol use. He reports that he does not use drugs.    Objective: Vitals:   01/28/21 1404  BP: (!) 152/99  Pulse: 83  Temp: 98.6 F (37 C)  TempSrc: Temporal  SpO2: 97%  Weight: (!) 395 lb 3.2 oz (179.3 kg)  Height: 5\' 10"  (1.778 m)    Body mass index is 56.71 kg/m.  Physical Exam Vitals reviewed.  Constitutional:      Appearance: Normal appearance. He is obese.  HENT:     Head: Normocephalic and atraumatic.  Cardiovascular:     Rate and Rhythm: Normal rate and regular rhythm.     Heart sounds: Normal heart sounds.  Pulmonary:     Effort: Pulmonary effort is normal.     Breath sounds: Normal breath sounds.  Abdominal:     General: Bowel sounds are normal.     Palpations: Abdomen is soft.  Skin:    Capillary Refill: Capillary refill takes less than 2 seconds.  Neurological:     General: No focal deficit present.     Mental Status: He is alert and oriented to person, place, and time.  Psychiatric:        Mood and Affect: Mood normal.        Behavior: Behavior normal.      Flowsheet Row  Office Visit from 01/28/2021 in [facility]  PHQ-9 Total Score 12     GAD 7 : Generalized Anxiety Score 01/28/2021  Nervous, Anxious, on Edge 2  Control/stop worrying 0  Worry too much - different things 2  Trouble relaxing 2  Restless 1  Easily annoyed or irritable 3  Afraid - awful might happen 1  Total GAD 7 Score 11        Assessment/plan: 1. Essential hypertension Way above goal. Not taking medication daily. Discussed importance of taking medication daily. He will get his blood pressure cuff in the mail soon and can start a log. Adding on norvasc 5mg /daily as well. Recommended  low salt diet and the importance of exercise and weight loss. Had a long talk about his weight and how deeply concerned I am. Continues to gain. Discussed all the risks this entails and that I really hope and encourage him to start working hard on diet and exercise. He is seeing the weight loss clinic and is interested in gastric sleeve surgery. Will see him back in one month for f/u of blood pressure. precautions given.   2. Anxiety and depression phq9 score and GAD7 score is moderate and just slightly worse than last one. He can't tell much of a difference on the zoloft. Discussed we are going to increase the zoloft to 100mg /day. F/u in one month.     This visit occurred during the SARS-CoV-2 public health emergency.  Safety protocols were in place, including screening questions prior to the visit, additional usage of staff PPE, and extensive cleaning of exam room while observing appropriate contact time as indicated for disinfecting solutions.     Return in about 1 month (around 02/28/2021) for bp/depression.   , MD [facility]   01/28/2021

## 2021-02-02 DIAGNOSIS — M79602 Pain in left arm: Secondary | ICD-10-CM | POA: Diagnosis not present

## 2021-02-02 DIAGNOSIS — M79601 Pain in right arm: Secondary | ICD-10-CM | POA: Diagnosis not present

## 2021-02-02 DIAGNOSIS — M542 Cervicalgia: Secondary | ICD-10-CM | POA: Diagnosis not present

## 2021-02-02 DIAGNOSIS — M546 Pain in thoracic spine: Secondary | ICD-10-CM | POA: Diagnosis not present

## 2021-02-04 DIAGNOSIS — M542 Cervicalgia: Secondary | ICD-10-CM | POA: Diagnosis not present

## 2021-02-04 DIAGNOSIS — M79602 Pain in left arm: Secondary | ICD-10-CM | POA: Diagnosis not present

## 2021-02-04 DIAGNOSIS — M79601 Pain in right arm: Secondary | ICD-10-CM | POA: Diagnosis not present

## 2021-02-04 DIAGNOSIS — M546 Pain in thoracic spine: Secondary | ICD-10-CM | POA: Diagnosis not present

## 2021-02-05 DIAGNOSIS — F4320 Adjustment disorder, unspecified: Secondary | ICD-10-CM | POA: Diagnosis not present

## 2021-02-09 DIAGNOSIS — M546 Pain in thoracic spine: Secondary | ICD-10-CM | POA: Diagnosis not present

## 2021-02-09 DIAGNOSIS — M79602 Pain in left arm: Secondary | ICD-10-CM | POA: Diagnosis not present

## 2021-02-09 DIAGNOSIS — M542 Cervicalgia: Secondary | ICD-10-CM | POA: Diagnosis not present

## 2021-02-09 DIAGNOSIS — M79601 Pain in right arm: Secondary | ICD-10-CM | POA: Diagnosis not present

## 2021-02-16 ENCOUNTER — Ambulatory Visit (INDEPENDENT_AMBULATORY_CARE_PROVIDER_SITE_OTHER): Payer: Federal, State, Local not specified - PPO | Admitting: Adult Health

## 2021-02-16 ENCOUNTER — Other Ambulatory Visit: Payer: Self-pay

## 2021-02-16 ENCOUNTER — Encounter (INDEPENDENT_AMBULATORY_CARE_PROVIDER_SITE_OTHER): Payer: Self-pay | Admitting: Adult Health

## 2021-02-16 VITALS — BP 134/88 | HR 81 | Temp 97.9°F | Ht 70.0 in | Wt 389.0 lb

## 2021-02-16 DIAGNOSIS — Z9189 Other specified personal risk factors, not elsewhere classified: Secondary | ICD-10-CM | POA: Diagnosis not present

## 2021-02-16 DIAGNOSIS — M79602 Pain in left arm: Secondary | ICD-10-CM | POA: Diagnosis not present

## 2021-02-16 DIAGNOSIS — E559 Vitamin D deficiency, unspecified: Secondary | ICD-10-CM

## 2021-02-16 DIAGNOSIS — I10 Essential (primary) hypertension: Secondary | ICD-10-CM

## 2021-02-16 DIAGNOSIS — Z6841 Body Mass Index (BMI) 40.0 and over, adult: Secondary | ICD-10-CM

## 2021-02-16 DIAGNOSIS — F419 Anxiety disorder, unspecified: Secondary | ICD-10-CM

## 2021-02-16 DIAGNOSIS — F32A Depression, unspecified: Secondary | ICD-10-CM

## 2021-02-16 DIAGNOSIS — M542 Cervicalgia: Secondary | ICD-10-CM | POA: Diagnosis not present

## 2021-02-16 DIAGNOSIS — M79601 Pain in right arm: Secondary | ICD-10-CM | POA: Diagnosis not present

## 2021-02-16 DIAGNOSIS — M546 Pain in thoracic spine: Secondary | ICD-10-CM | POA: Diagnosis not present

## 2021-02-16 MED ORDER — VITAMIN D (ERGOCALCIFEROL) 1.25 MG (50000 UNIT) PO CAPS: ORAL_CAPSULE | ORAL | 0 refills | Status: DC

## 2021-02-17 DIAGNOSIS — F32A Depression, unspecified: Secondary | ICD-10-CM | POA: Insufficient documentation

## 2021-02-17 DIAGNOSIS — F419 Anxiety disorder, unspecified: Secondary | ICD-10-CM | POA: Insufficient documentation

## 2021-02-17 NOTE — Progress Notes (Signed)
Chief Complaint:   OBESITY Shawn [name] is here to discuss his progress with his obesity treatment plan along with follow-up of his obesity related diagnoses. Shawn [name] is on the Category 4 Plan and states he is following his eating plan approximately 20-25% of the time. Shawn [name] states he is walking 30 minutes 1 times per week.  Today's visit was #: 6 Starting weight: 392 lbs Starting date: 08/10/2020 Today's weight: 389 lbs Today's date: 02/16/2021 Total lbs lost to date: 3  Total lbs lost since last in-office visit: 7  Interim History: Shawn [name] has reduced fried food and cake. He has replaced soda with diet soda.  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 He reports decline in vision- has not had a dilated eye examination in several years.  Subjective:   1. Essential hypertension Rohan's BP/HR are much improved today! He denies cardiac symptoms. He is on HCTZ 25 mg QD, amlodipine 5 mg QD, and lisinopril 40 mg QD.  BP Readings from Last 3 Encounters:  02/16/21 134/88  01/28/21 (!) 152/99  01/26/21 (!) 175/105    2. Anxiety and depression Didier's PCP increased sertraline to 100 mg QD on 01/28/2021. He reports stable mood. Pt denies suicidal or homicidal ideations. He is seeing a therapist every 2-4 weeks.  3. Vitamin D deficiency Shawn [name]'s Vitamin D level was well below goal of 50 at 24.1 on 08/10/2020. He is currently taking prescription vitamin D 50,000 IU each week. He denies nausea, vomiting or muscle weakness.   Ref. Range 08/10/2020 11:30  Vitamin D, 25-Hydroxy Latest Ref Range: 30.0 - 100.0 ng/mL 24.1 (L)   4. At risk for heart disease Shawn [name] is at a higher than average risk for cardiovascular disease due to obesity and hypertension.  Assessment/Plan:   1. Essential hypertension Shawn [name] is working on healthy weight loss and exercise to improve blood pressure control. We will watch for signs of hypotension as he  continues his lifestyle modifications. Continue current anti-hypertensive regimen.  2. Anxiety and depression Behavior modification techniques were discussed today to help Shawn [name] deal with his anxiety.  Orders and follow up as documented in patient record. Follow up with PCP mid April.  3. Vitamin D deficiency Low Vitamin D level contributes to fatigue and are associated with obesity, breast, and colon cancer. He agrees to continue to take prescription Vitamin D @50 ,000 IU every week and will follow-up for routine testing of Vitamin D, at least 2-3 times per year to avoid over-replacement.  - Vitamin D, Ergocalciferol, (DRISDOL) 1.25 MG (50000 UNIT) CAPS capsule; TAKE ONE CAPSULE BY MOUTH ONCE A WEEK FOR 12 WEEKS. THEN TAKE 2000IU/DAY  Dispense: 12 capsule; Refill: 0  4. At risk for heart disease Wrigley was given approximately 15 minutes of coronary artery disease prevention counseling today. He is 35 y.o. male and has risk factors for heart disease including obesity. We discussed intensive lifestyle modifications today with an emphasis on specific weight loss instructions and strategies.   Repetitive spaced learning was employed today to elicit superior memory formation and behavioral change.  5. Class 3 severe obesity with serious comorbidity and body mass index (BMI) of 50.0 to 59.9 in adult, unspecified obesity type (HCC) Shawn [name] is currently in the action stage of change. As such, his goal is to continue with weight loss efforts. He has agreed to the Category 4 Plan.   Schedule dilated eye exam.  Exercise goals: As is  Behavioral modification strategies: increasing  lean protein intake, decreasing simple carbohydrates, increasing water intake, decreasing liquid calories, meal planning and cooking strategies and planning for success.  Shawn [name] has agreed to follow-up with our clinic in 2-3 weeks. He was informed of the importance of frequent follow-up visits to maximize his success  with intensive lifestyle modifications for his multiple health conditions.   Objective:   Blood pressure 134/88, pulse 81, temperature 97.9 F (36.6 C), height 5\' 10"  (1.778 m), weight (!) 389 lb (176.4 kg), SpO2 98 %. Body mass index is 55.82 kg/m.  General: Cooperative, alert, well developed, in no acute distress. HEENT: Conjunctivae and lids unremarkable. Cardiovascular: Regular rhythm.  Lungs: Normal work of breathing. Neurologic: No focal deficits.   Lab Results  Component Value Date   CREATININE 1.21 08/10/2020   BUN 18 08/10/2020   NA 140 08/10/2020   K 4.6 08/10/2020   CL 100 08/10/2020   CO2 27 08/10/2020   Lab Results  Component Value Date   ALT 13 08/10/2020   AST 22 08/10/2020   ALKPHOS 91 08/10/2020   BILITOT 0.3 08/10/2020   Lab Results  Component Value Date   HGBA1C 5.5 08/10/2020   HGBA1C 5.1 12/11/2012   Lab Results  Component Value Date   INSULIN 28.3 (H) 08/10/2020   Lab Results  Component Value Date   TSH 1.020 08/10/2020   Lab Results  Component Value Date   CHOL 212 (H) 08/10/2020   HDL 33 (L) 08/10/2020   LDLCALC 158 (H) 08/10/2020   LDLDIRECT 141 (H) 12/11/2012   TRIG 114 08/10/2020   CHOLHDL 5 03/23/2020   Lab Results  Component Value Date   WBC 5.3 08/10/2020   HGB 13.8 08/10/2020   HCT 41.6 08/10/2020   MCV 85 08/10/2020   PLT 355 08/10/2020    Attestation Statements:   Reviewed by clinician on day of visit: allergies, medications, problem list, medical history, surgical history, family history, social history, and previous encounter notes.  08/12/2020, am acting as Edmund Hilda for Energy manager, NP.  I have reviewed the above documentation for accuracy and completeness, and I agree with the above. -  Joran Kallal d. Asiya Cutbirth, NP-C

## 2021-02-18 DIAGNOSIS — M79602 Pain in left arm: Secondary | ICD-10-CM | POA: Diagnosis not present

## 2021-02-18 DIAGNOSIS — M542 Cervicalgia: Secondary | ICD-10-CM | POA: Diagnosis not present

## 2021-02-18 DIAGNOSIS — M79601 Pain in right arm: Secondary | ICD-10-CM | POA: Diagnosis not present

## 2021-02-18 DIAGNOSIS — M546 Pain in thoracic spine: Secondary | ICD-10-CM | POA: Diagnosis not present

## 2021-02-25 DIAGNOSIS — M79601 Pain in right arm: Secondary | ICD-10-CM | POA: Diagnosis not present

## 2021-02-25 DIAGNOSIS — M79602 Pain in left arm: Secondary | ICD-10-CM | POA: Diagnosis not present

## 2021-02-25 DIAGNOSIS — M542 Cervicalgia: Secondary | ICD-10-CM | POA: Diagnosis not present

## 2021-02-25 DIAGNOSIS — M546 Pain in thoracic spine: Secondary | ICD-10-CM | POA: Diagnosis not present

## 2021-03-04 DIAGNOSIS — M79602 Pain in left arm: Secondary | ICD-10-CM | POA: Diagnosis not present

## 2021-03-04 DIAGNOSIS — M542 Cervicalgia: Secondary | ICD-10-CM | POA: Diagnosis not present

## 2021-03-04 DIAGNOSIS — M79601 Pain in right arm: Secondary | ICD-10-CM | POA: Diagnosis not present

## 2021-03-04 DIAGNOSIS — M546 Pain in thoracic spine: Secondary | ICD-10-CM | POA: Diagnosis not present

## 2021-03-05 DIAGNOSIS — F4320 Adjustment disorder, unspecified: Secondary | ICD-10-CM | POA: Diagnosis not present

## 2021-03-10 ENCOUNTER — Other Ambulatory Visit: Payer: Self-pay

## 2021-03-10 ENCOUNTER — Encounter: Payer: Self-pay | Admitting: Family Medicine

## 2021-03-10 ENCOUNTER — Ambulatory Visit: Payer: Federal, State, Local not specified - PPO | Admitting: Family Medicine

## 2021-03-10 VITALS — BP 142/84 | HR 84 | Temp 98.4°F | Ht 70.0 in | Wt >= 6400 oz

## 2021-03-10 DIAGNOSIS — F339 Major depressive disorder, recurrent, unspecified: Secondary | ICD-10-CM

## 2021-03-10 DIAGNOSIS — I10 Essential (primary) hypertension: Secondary | ICD-10-CM | POA: Diagnosis not present

## 2021-03-10 MED ORDER — BUPROPION HCL ER (XL) 150 MG PO TB24: 150.0000 mg | ORAL_TABLET | Freq: Every day | ORAL | 0 refills | Status: DC

## 2021-03-10 NOTE — Patient Instructions (Signed)
1) blood pressure much better. Keep up the medication.    2) for depression.Marland Kitchen still have a ways to go. Instead of increasing zoloft we can add on a drug called wellbutrin which may help with your weight loss as well. Its different than the zoloft and you can take them together. Just do not take them with the trazodone as they can interact. Can make anxiety worse so please let me know or you can just stop it if only on a few weeks.   3) will write a letter for you. Send when im done.    Hang in there!  Dr. Artis Flock
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
## 2021-03-12 ENCOUNTER — Encounter: Payer: Self-pay | Admitting: Family Medicine

## 2021-03-15 ENCOUNTER — Ambulatory Visit (INDEPENDENT_AMBULATORY_CARE_PROVIDER_SITE_OTHER): Payer: Federal, State, Local not specified - PPO | Admitting: Adult Health

## 2021-03-18 ENCOUNTER — Ambulatory Visit (INDEPENDENT_AMBULATORY_CARE_PROVIDER_SITE_OTHER): Payer: Federal, State, Local not specified - PPO | Admitting: Family Medicine

## 2021-03-18 DIAGNOSIS — M79602 Pain in left arm: Secondary | ICD-10-CM | POA: Diagnosis not present

## 2021-03-18 DIAGNOSIS — M79601 Pain in right arm: Secondary | ICD-10-CM | POA: Diagnosis not present

## 2021-03-18 DIAGNOSIS — M542 Cervicalgia: Secondary | ICD-10-CM | POA: Diagnosis not present

## 2021-03-18 DIAGNOSIS — M546 Pain in thoracic spine: Secondary | ICD-10-CM | POA: Diagnosis not present

## 2021-03-19 DIAGNOSIS — F4320 Adjustment disorder, unspecified: Secondary | ICD-10-CM | POA: Diagnosis not present

## 2021-03-25 ENCOUNTER — Encounter (INDEPENDENT_AMBULATORY_CARE_PROVIDER_SITE_OTHER): Payer: Self-pay | Admitting: Family Medicine

## 2021-03-25 DIAGNOSIS — M79601 Pain in right arm: Secondary | ICD-10-CM | POA: Diagnosis not present

## 2021-03-25 DIAGNOSIS — M546 Pain in thoracic spine: Secondary | ICD-10-CM | POA: Diagnosis not present

## 2021-03-25 DIAGNOSIS — M542 Cervicalgia: Secondary | ICD-10-CM | POA: Diagnosis not present

## 2021-03-25 DIAGNOSIS — M79602 Pain in left arm: Secondary | ICD-10-CM | POA: Diagnosis not present

## 2021-03-29 NOTE — Telephone Encounter (Signed)
Pt last seen by [name] Danford, FNP.  

## 2021-04-06 ENCOUNTER — Telehealth (INDEPENDENT_AMBULATORY_CARE_PROVIDER_SITE_OTHER): Payer: Self-pay

## 2021-04-06 ENCOUNTER — Encounter (INDEPENDENT_AMBULATORY_CARE_PROVIDER_SITE_OTHER): Payer: Self-pay | Admitting: Family Medicine

## 2021-04-06 ENCOUNTER — Other Ambulatory Visit: Payer: Self-pay

## 2021-04-06 ENCOUNTER — Ambulatory Visit (INDEPENDENT_AMBULATORY_CARE_PROVIDER_SITE_OTHER): Payer: Federal, State, Local not specified - PPO | Admitting: Family Medicine

## 2021-04-06 VITALS — BP 116/79 | HR 97 | Temp 98.3°F | Ht 70.0 in | Wt 393.0 lb

## 2021-04-06 DIAGNOSIS — Z9189 Other specified personal risk factors, not elsewhere classified: Secondary | ICD-10-CM | POA: Diagnosis not present

## 2021-04-06 DIAGNOSIS — R7309 Other abnormal glucose: Secondary | ICD-10-CM | POA: Diagnosis not present

## 2021-04-06 DIAGNOSIS — R739 Hyperglycemia, unspecified: Secondary | ICD-10-CM

## 2021-04-06 DIAGNOSIS — Z6841 Body Mass Index (BMI) 40.0 and over, adult: Secondary | ICD-10-CM | POA: Diagnosis not present

## 2021-04-06 DIAGNOSIS — R632 Polyphagia: Secondary | ICD-10-CM | POA: Diagnosis not present

## 2021-04-06 DIAGNOSIS — F4320 Adjustment disorder, unspecified: Secondary | ICD-10-CM | POA: Diagnosis not present

## 2021-04-06 MED ORDER — OZEMPIC (0.25 OR 0.5 MG/DOSE) 2 MG/1.5ML ~~LOC~~ SOPN: 0.2500 mg | PEN_INJECTOR | SUBCUTANEOUS | 0 refills | Status: DC

## 2021-04-06 NOTE — Telephone Encounter (Signed)
Attempted to complete PA for Ozempic 0.25mg  once weekly injection.  PA was cancelled because PA was not needed, and no further issues exist with this medication.  Shawn [name] (Key: [id number]) Ozempic (0.25 or 0.5 MG/DOSE) 2MG /1.5ML pen-injectors   Form PA Form (2017 NCPDP) Created 2 minutes ago Sent to Plan less than a minute ago Plan Response less than a minute ago Submit Clinical Questions Determination N/A Message from Plan Your PA has been resolved, no additional PA is required. For further inquiries please contact the number on the back of the member prescription card. (Message [phone])

## 2021-04-07 NOTE — Progress Notes (Signed)
Chief Complaint:   OBESITY Shawn [name] is here to discuss his progress with his obesity treatment plan along with follow-up of his obesity related diagnoses. [name] is on the Category 4 Plan and states he is following his eating plan approximately 35% of the time. [name] states he is walking, doing yard work, and PT 60 minutes 2-3 times per week.  Today's visit was #: 7 Starting weight: 392 lbs Starting date: 08/10/2020 Today's weight: 393 lbs Today's date: 04/06/2021 Total lbs lost to date: 0 Total lbs lost since last in-office visit: 0  Interim History: Shawn [name] is interested in following meal plan in conjunction with medication. He started Wellbutrin and really hasn't been hungry. He has also been eating only 1 meal a day. He is wondering how to incorporate protein shakes.  Subjective:   1. Elevated random blood glucose level [name] got a [employer] coupon but didn't pick it up. He has had significant BS elevation in the past.  2. Polyphagia [name] is struggling with control of indulgent eating. He reports some improvement with Wellbutrin.  3. At risk for adverse drug event [name] is at risk for side effects of medication due to starting Ozempic or Qsymia.  Assessment/Plan:   1. Elevated random blood glucose level Fasting labs will be obtained and results with be discussed with [name] in 2 weeks at his follow up visit. In the meanwhile Shawn [name] was started on a lower simple carbohydrate diet and will work on weight loss efforts. -Start Ozempic 0.25 mg, as prescribed below. - Semaglutide,0.25 or 0.5MG /DOS, (OZEMPIC, 0.25 OR 0.5 MG/DOSE,) 2 MG/1.5ML SOPN; Inject 0.25 mg into the skin once a week.  Dispense: 1.5 mL; Refill: 0  2. Polyphagia Intensive lifestyle modifications are the first line treatment for this issue. We discussed several lifestyle modifications today and he will continue to work on diet, exercise and weight loss efforts. Orders and follow up as  documented in patient record. -Start Phentermine 4 mg PO QAM. #28, 0 refills -Start Topiramate 25 mg PO QAM, #30, 0 refills  Counseling . Polyphagia is excessive hunger. . Causes can include: low blood sugars, hypERthyroidism, PMS, lack of sleep, stress, insulin resistance, diabetes, certain medications, and diets that are deficient in protein and fiber.   3. At risk for adverse drug event Shawn [name] was given approximately 15 minutes of drug side effect counseling today.  We discussed side effect possibility and risk versus benefits. Shawn [name] agreed to the medication and will contact this office if these side effects are intolerable.  Repetitive spaced learning was employed today to elicit superior memory formation and behavioral change.  4. Class 3 severe obesity with serious comorbidity and body mass index (BMI) of 50.0 to 59.9 in adult, unspecified obesity type (HCC)  [name] is currently in the action stage of change. As such, his goal is to continue with weight loss efforts. He has agreed to the Category 4 Plan.   Exercise goals: As is  Behavioral modification strategies: increasing lean protein intake, no skipping meals and meal planning and cooking strategies.  Shawn [name] has agreed to follow-up with our clinic in 2-3 weeks. He was informed of the importance of frequent follow-up visits to maximize his success with intensive lifestyle modifications for his multiple health conditions.   Objective:   Blood pressure 116/79, pulse 97, temperature 98.3 F (36.8 C), height 5\' 10"  (1.778 m), weight (!) 393 lb (178.3 kg), SpO2 98 %. Body mass index is 56.39 kg/m.  General: Cooperative, alert, well developed, in  no acute distress. HEENT: Conjunctivae and lids unremarkable. Cardiovascular: Regular rhythm.  Lungs: Normal work of breathing. Neurologic: No focal deficits.   Lab Results  Component Value Date   CREATININE 1.21 08/10/2020   BUN 18 08/10/2020   NA 140 08/10/2020   K 4.6  08/10/2020   CL 100 08/10/2020   CO2 27 08/10/2020   Lab Results  Component Value Date   ALT 13 08/10/2020   AST 22 08/10/2020   ALKPHOS 91 08/10/2020   BILITOT 0.3 08/10/2020   Lab Results  Component Value Date   HGBA1C 5.5 08/10/2020   HGBA1C 5.1 12/11/2012   Lab Results  Component Value Date   INSULIN 28.3 (H) 08/10/2020   Lab Results  Component Value Date   TSH 1.020 08/10/2020   Lab Results  Component Value Date   CHOL 212 (H) 08/10/2020   HDL 33 (L) 08/10/2020   LDLCALC 158 (H) 08/10/2020   LDLDIRECT 141 (H) 12/11/2012   TRIG 114 08/10/2020   CHOLHDL 5 03/23/2020   Lab Results  Component Value Date   WBC 5.3 08/10/2020   HGB 13.8 08/10/2020   HCT 41.6 08/10/2020   MCV 85 08/10/2020   PLT 355 08/10/2020   No results found for: IRON, TIBC, FERRITIN  Attestation Statements:   Reviewed by clinician on day of visit: allergies, medications, problem list, medical history, surgical history, family history, social history, and previous encounter notes.  Edmund Hilda, CMA, am acting as transcriptionist for Reuben Likes, MD.   I have reviewed the above documentation for accuracy and completeness, and I agree with the above. - Katherina Mires, MD

## 2021-04-08 DIAGNOSIS — M546 Pain in thoracic spine: Secondary | ICD-10-CM | POA: Diagnosis not present

## 2021-04-08 DIAGNOSIS — M79602 Pain in left arm: Secondary | ICD-10-CM | POA: Diagnosis not present

## 2021-04-08 DIAGNOSIS — M542 Cervicalgia: Secondary | ICD-10-CM | POA: Diagnosis not present

## 2021-04-08 DIAGNOSIS — M79601 Pain in right arm: Secondary | ICD-10-CM | POA: Diagnosis not present

## 2021-04-09 ENCOUNTER — Encounter: Payer: Self-pay | Admitting: Family Medicine

## 2021-04-09 ENCOUNTER — Other Ambulatory Visit: Payer: Self-pay

## 2021-04-09 ENCOUNTER — Ambulatory Visit: Payer: Federal, State, Local not specified - PPO | Admitting: Family Medicine

## 2021-04-09 VITALS — BP 142/86 | HR 84 | Temp 98.0°F | Ht 70.0 in | Wt 397.4 lb

## 2021-04-09 DIAGNOSIS — F339 Major depressive disorder, recurrent, unspecified: Secondary | ICD-10-CM

## 2021-04-09 DIAGNOSIS — K5900 Constipation, unspecified: Secondary | ICD-10-CM

## 2021-04-09 DIAGNOSIS — G8929 Other chronic pain: Secondary | ICD-10-CM

## 2021-04-09 DIAGNOSIS — F4320 Adjustment disorder, unspecified: Secondary | ICD-10-CM | POA: Diagnosis not present

## 2021-04-09 DIAGNOSIS — M546 Pain in thoracic spine: Secondary | ICD-10-CM | POA: Diagnosis not present

## 2021-04-09 MED ORDER — BUPROPION HCL ER (XL) 300 MG PO TB24: 300.0000 mg | ORAL_TABLET | Freq: Every day | ORAL | 1 refills | Status: DC

## 2021-04-09 NOTE — Patient Instructions (Addendum)
[facility] at [facility] [place]! Where the sports med appointment will be!     1) increase fiber: can get metamucil or benefiber 2) continue with stool softner 3) add on miralax prn.    -sent in 300mg  of the wellbutrin to take daily. Glad you like this!   Constipation, Adult Constipation is when a person has trouble pooping (having a bowel movement). When you have this condition, you may poop fewer than 3 times a week. Your poop (stool) may also be dry, hard, or bigger than normal. Follow these instructions at home: Eating and drinking  Eat foods that have a lot of fiber, such as: ? Fresh fruits and vegetables. ? Whole grains. ? Beans.  Eat less of foods that are low in fiber and high in fat and sugar, such as: ? fries. ? Hamburgers. ? Cookies. ? Candy. ? Soda.  Drink enough fluid to keep your pee (urine) pale yellow.   General instructions  Exercise regularly or as told by your doctor. Try to do 150 minutes of exercise each week.  Go to the restroom when you feel like you need to poop. Do not hold it in.  Take over-the-counter and prescription medicines only as told by your doctor. These include any fiber supplements.  When you poop: ? Do deep breathing while relaxing your lower belly (abdomen). ? Relax your pelvic floor. The pelvic floor is a group of muscles that support the rectum, bladder, and intestines (as well as the uterus in women).  Watch your condition for any changes. Tell your doctor if you notice any.  Keep all follow-up visits as told by your doctor. This is important. Contact a doctor if:  You have pain that gets worse.  You have a fever.  You have not pooped for 4 days.  You vomit.  You are not hungry.  You lose weight.  You are bleeding from the opening of the butt (anus).  You have thin, pencil-like poop. Get help right away if:  You have a fever, and your symptoms suddenly get worse.  You leak poop or have blood in  your poop.  Your belly feels hard or bigger than normal (bloated).  You have very bad belly pain.  You feel dizzy or you faint. Summary  Constipation is when a person poops fewer than 3 times a week, has trouble pooping, or has poop that is dry, hard, or bigger than normal.  Eat foods that have a lot of fiber.  Drink enough fluid to keep your pee (urine) pale yellow.  Take over-the-counter and prescription medicines only as told by your doctor. These include any fiber supplements. This information is not intended to replace advice given to you by your health care provider. Make sure you discuss any questions you have with your health care provider. Document Revised: 09/25/2019 Document Reviewed: 09/25/2019 Elsevier Patient Education  2021 2022.
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
## 2021-04-20 ENCOUNTER — Other Ambulatory Visit: Payer: Self-pay

## 2021-04-20 ENCOUNTER — Ambulatory Visit: Payer: Federal, State, Local not specified - PPO | Admitting: Family Medicine

## 2021-04-20 VITALS — BP 130/88 | HR 91 | Ht 70.0 in | Wt 392.4 lb

## 2021-04-20 DIAGNOSIS — M546 Pain in thoracic spine: Secondary | ICD-10-CM | POA: Diagnosis not present

## 2021-04-20 DIAGNOSIS — M5414 Radiculopathy, thoracic region: Secondary | ICD-10-CM
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
## 2021-04-20 NOTE — Patient Instructions (Signed)
Thank you for coming in today.  You should hear from MRI scheduling within 1 week. If you do not hear please let me know.   Recheck after the MRI.   Try a TENS unit.   TENS UNIT: This is helpful for muscle pain and spasm.   Search and Purchase a TENS 7000 2nd edition at  www.tenspros.com or www.Amazon.com It should be less than $30.     TENS unit instructions: Do not shower or bathe with the unit on . Turn the unit off before removing electrodes or batteries . If the electrodes lose stickiness add a drop of water to the electrodes after they are disconnected from the unit and place on plastic sheet. If you continued to have difficulty, call the TENS unit company to purchase more electrodes. . Do not apply lotion on the skin area prior to use. Make sure the skin is clean and dry as this will help prolong the life of the electrodes. . After use, always check skin for unusual red areas, rash or other skin difficulties. If there are any skin problems, does not apply electrodes to the same area. . Never remove the electrodes from the unit by pulling the wires. . Do not use the TENS unit or electrodes other than as directed. . Do not change electrode placement without consultating your therapist or physician. Marland Kitchen Keep 2 fingers with between each electrode. . Wear time ratio is 2:1, on to off times.    For example on for 30 minutes off for 15 minutes and then on for 30 minutes off for 15 minutes

## 2021-04-22 ENCOUNTER — Encounter: Payer: Self-pay | Admitting: Family Medicine

## 2021-04-22 DIAGNOSIS — M546 Pain in thoracic spine: Secondary | ICD-10-CM | POA: Diagnosis not present

## 2021-04-22 DIAGNOSIS — M79601 Pain in right arm: Secondary | ICD-10-CM | POA: Diagnosis not present

## 2021-04-22 DIAGNOSIS — M542 Cervicalgia: Secondary | ICD-10-CM | POA: Diagnosis not present

## 2021-04-22 DIAGNOSIS — M79602 Pain in left arm: Secondary | ICD-10-CM | POA: Diagnosis not present

## 2021-04-24 ENCOUNTER — Other Ambulatory Visit: Payer: Self-pay

## 2021-04-24 ENCOUNTER — Ambulatory Visit (INDEPENDENT_AMBULATORY_CARE_PROVIDER_SITE_OTHER): Payer: Federal, State, Local not specified - PPO

## 2021-04-24 DIAGNOSIS — M546 Pain in thoracic spine: Secondary | ICD-10-CM | POA: Diagnosis not present

## 2021-04-24 IMAGING — MR MR THORACIC SPINE W/O CM
5 series · 42 of 48 positions shown · non-contrast
Comparison: CT of the thoracic spine [DATE] thoracic spine
radiographs [DATE].

CLINICAL DATA: Mid back pain. Left-sided thoracic spine pain. Pain
along medial aspect of the left scapula.

EXAM:
MRI THORACIC SPINE WITHOUT CONTRAST
TECHNIQUE: Multiplanar, multisequence MR imaging of the thoracic spine was
performed. No intravenous contrast was administered.

[Series 4: T2 · sagittal · 3.0mm · 1.00mm/px · 6 of 15 slices shown (1 of 2)]
[im 1/15]
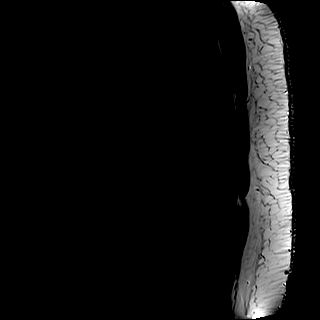
[im 3/15]
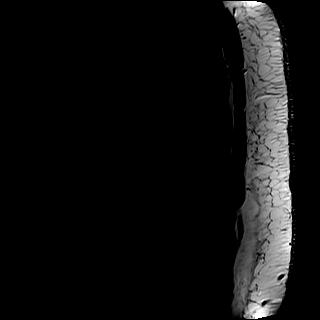
[im 6/15]
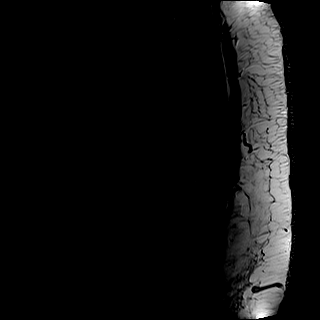
[im 9/15]
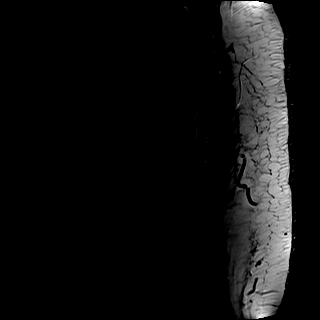
[im 12/15]
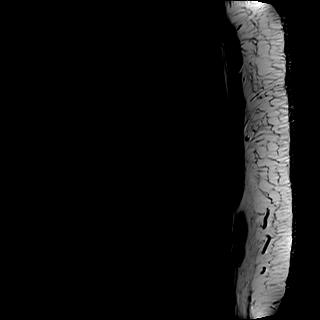
[im 15/15]
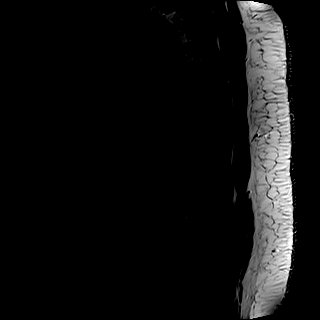

[Series 5: T1 · sagittal · 3.0mm · 1.00mm/px · 6 of 15 slices shown]
[im 1/15]
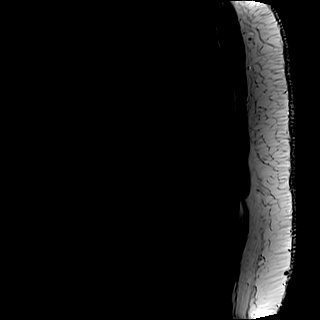
[im 3/15]
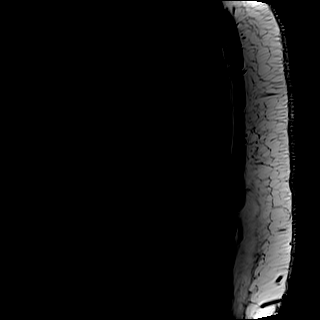
[im 6/15]
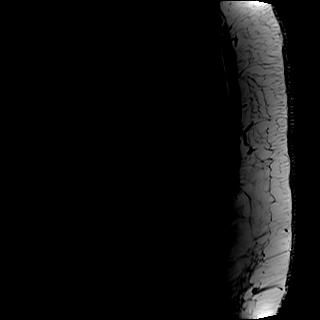
[im 9/15]
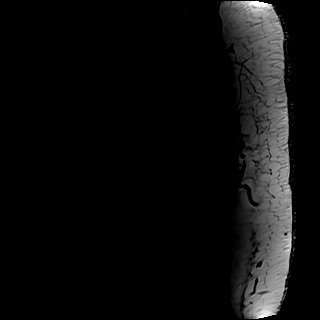
[im 12/15]
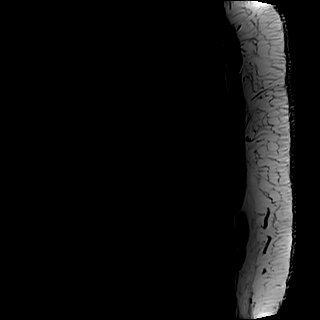
[im 15/15]
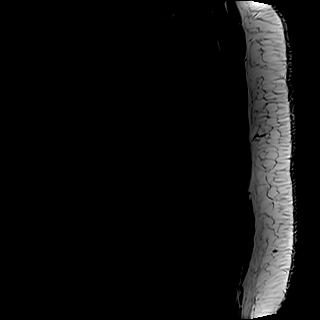

[Series 6: STIR · sagittal · 3.0mm · 1.00mm/px · 6 of 15 slices shown]
[im 1/15]
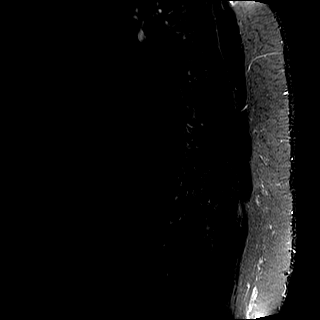
[im 3/15]
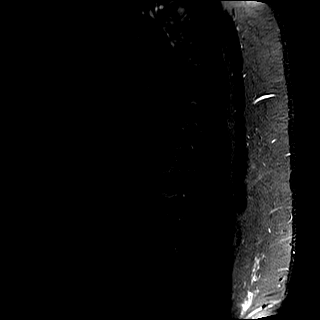
[im 6/15]
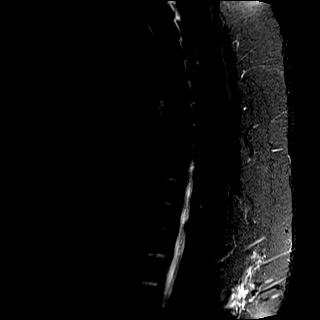
[im 9/15]
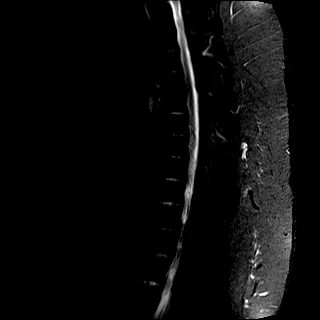
[im 12/15]
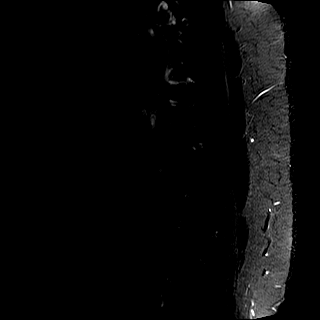
[im 15/15]
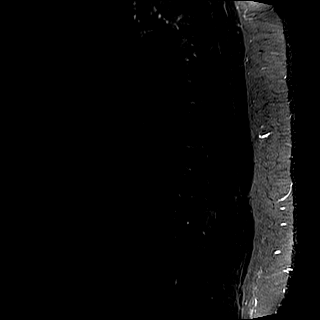

[Series 7: T2 · axial · 5.0mm · 0.86mm/px · z∈[-258,-8]mm · 15 of 39 slices shown (2 of 2)]
[im 1/39]
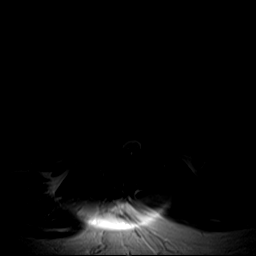
[im 3/39]
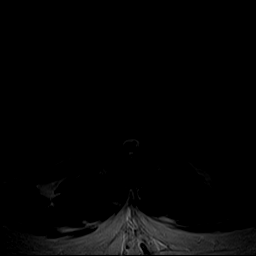
[im 6/39]
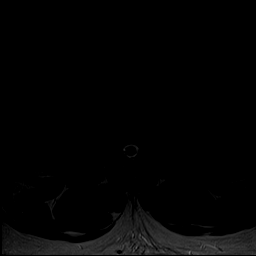
[im 9/39]
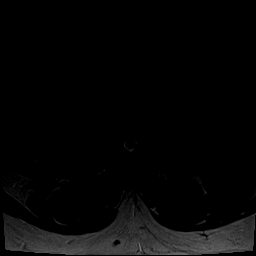
[im 11/39]
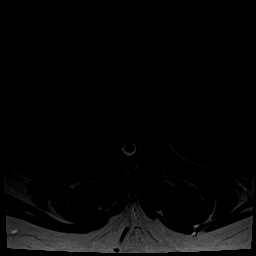
[im 14/39]
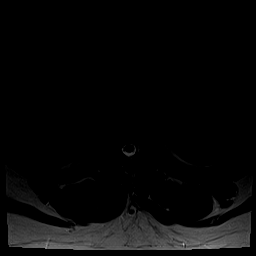
[im 17/39]
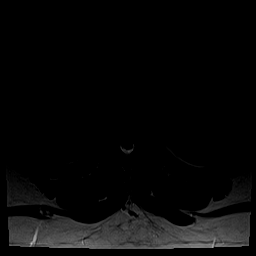
[im 20/39]
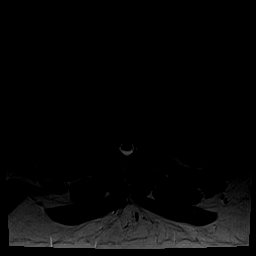
[im 22/39]
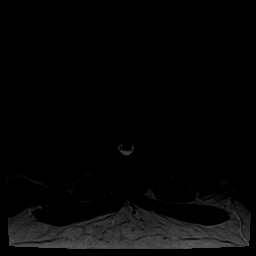
[im 25/39]
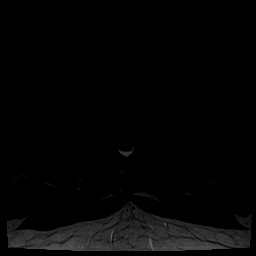
[im 28/39]
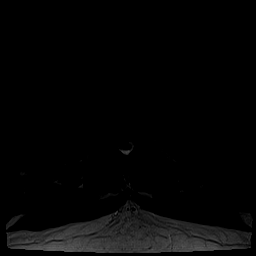
[im 30/39]
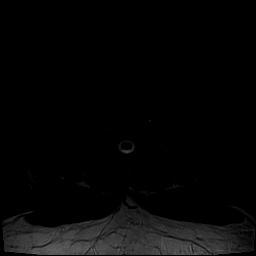
[im 33/39]
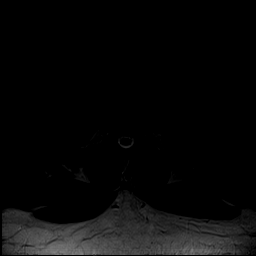
[im 36/39]
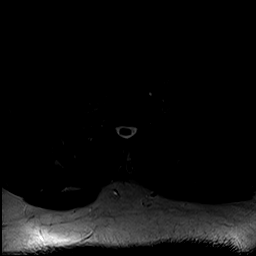
[im 39/39]
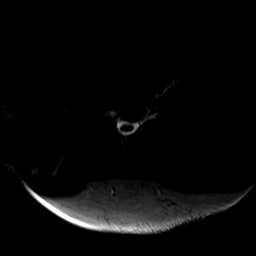

[Series 8: mpgr ax · axial · 5.0mm · 0.35mm/px · z∈[-269,-4]mm · 9 of 39 slices shown]
[im 1/39]
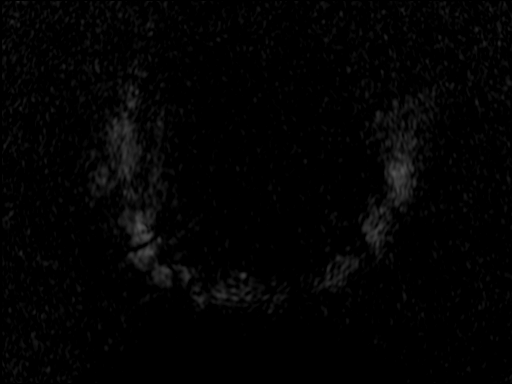
[im 3/39]
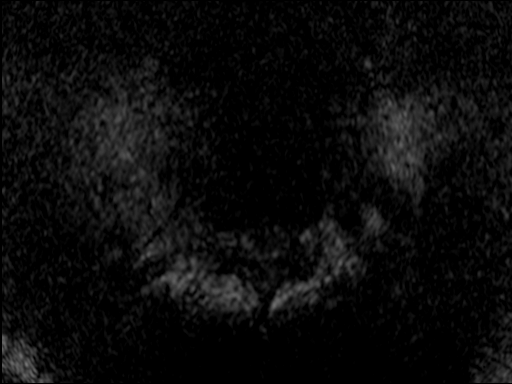
[im 6/39]
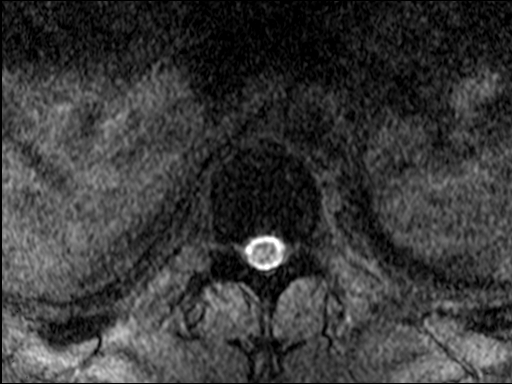
[im 11/39]
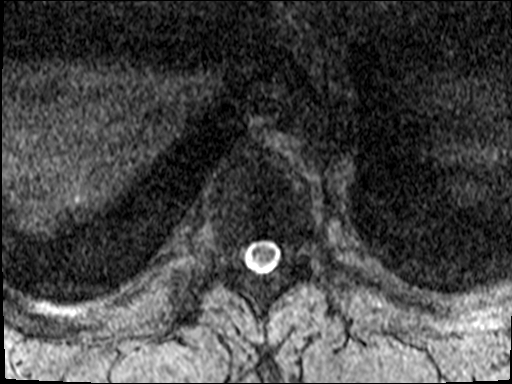
[im 17/39]
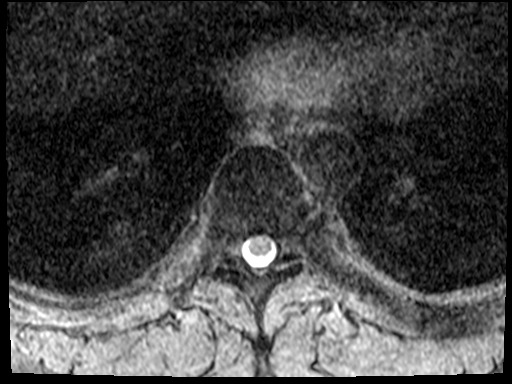
[im 22/39]
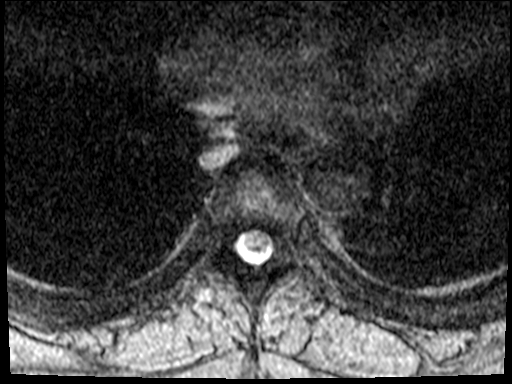
[im 28/39]
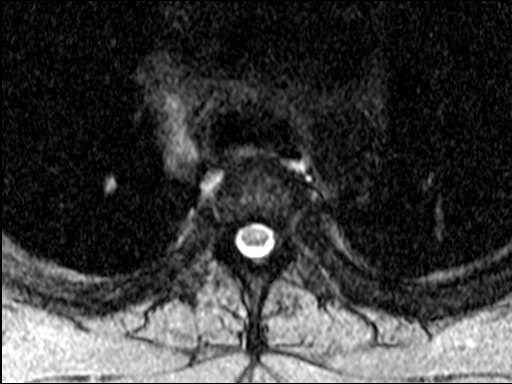
[im 33/39]
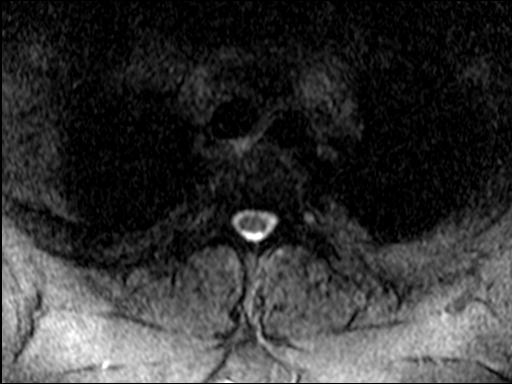
[im 39/39]
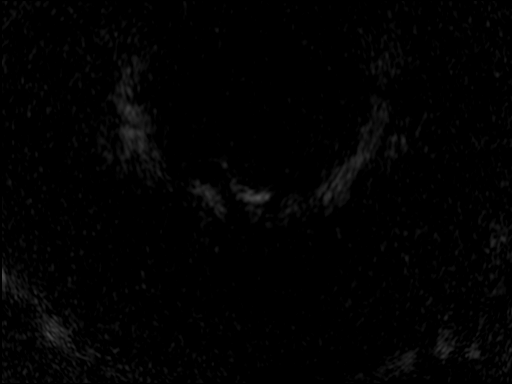

[42 of 48 positions shown; findings below may reference images not displayed]

FINDINGS: Alignment: No significant listhesis is present. Thoracic kyphosis is
preserved.

Vertebrae: Mild edematous endplate marrow changes are present at
T4-5. Marrow signal and vertebral body heights are otherwise normal.

Cord:  Normal signal and morphology.

Paraspinal and other soft tissues: Paraspinous soft tissues are
within normal limits. Visualized lung fields are clear.

Disc levels:

A shallow central disc protrusion is present at T4-5. There is
partial effacement of ventral CSF. Uncovertebral and facet spurring
at this level contribute to moderate bilateral foraminal stenosis,
right greater than left.

No other significant disc disease is present. No other significant
central or foraminal narrowing is present.
IMPRESSION: 1. Moderate bilateral foraminal stenosis at T4-5 is worse on the
right. This is secondary to a shallow central disc protrusion and
uncovertebral spurring.
2. Mild edematous endplate marrow changes at T4-5.
3. No other significant disc disease or stenosis.

## 2021-04-26 NOTE — Progress Notes (Signed)
MRI thoracic spine shows some narrowing of where the nerve comes out at T4-T5.  This would cause pain radiating around the line of the nipple.  Otherwise the spine looks okay.  We will talk about this further at your follow-up on the ninth.

## 2021-04-28 ENCOUNTER — Encounter (INDEPENDENT_AMBULATORY_CARE_PROVIDER_SITE_OTHER): Payer: Self-pay | Admitting: Adult Health

## 2021-04-28 ENCOUNTER — Other Ambulatory Visit: Payer: Self-pay

## 2021-04-28 ENCOUNTER — Ambulatory Visit (INDEPENDENT_AMBULATORY_CARE_PROVIDER_SITE_OTHER): Payer: Federal, State, Local not specified - PPO | Admitting: Adult Health

## 2021-04-28 VITALS — BP 120/79 | HR 94 | Temp 98.5°F | Ht 70.0 in | Wt 387.0 lb

## 2021-04-28 DIAGNOSIS — Z6841 Body Mass Index (BMI) 40.0 and over, adult: Secondary | ICD-10-CM

## 2021-04-28 DIAGNOSIS — R7309 Other abnormal glucose: Secondary | ICD-10-CM | POA: Diagnosis not present

## 2021-04-28 DIAGNOSIS — E559 Vitamin D deficiency, unspecified: Secondary | ICD-10-CM | POA: Diagnosis not present

## 2021-04-28 DIAGNOSIS — Z9189 Other specified personal risk factors, not elsewhere classified: Secondary | ICD-10-CM

## 2021-04-28 MED ORDER — VITAMIN D (ERGOCALCIFEROL) 1.25 MG (50000 UNIT) PO CAPS: ORAL_CAPSULE | ORAL | 0 refills | Status: DC

## 2021-04-28 MED ORDER — OZEMPIC (0.25 OR 0.5 MG/DOSE) 2 MG/1.5ML ~~LOC~~ SOPN: 0.2500 mg | PEN_INJECTOR | SUBCUTANEOUS | 0 refills | Status: DC

## 2021-04-29 ENCOUNTER — Ambulatory Visit: Payer: Federal, State, Local not specified - PPO | Admitting: Family Medicine

## 2021-04-29 ENCOUNTER — Encounter: Payer: Self-pay | Admitting: Family Medicine

## 2021-04-29 VITALS — BP 114/82 | HR 88 | Ht 70.0 in | Wt 393.6 lb

## 2021-04-29 DIAGNOSIS — M546 Pain in thoracic spine: Secondary | ICD-10-CM

## 2021-04-29 NOTE — Patient Instructions (Addendum)
Thank you for coming in today.   Please call [facility] at [phone] to schedule your epidurals.  These will need to be authorized by your insurance so wait to call until early next week.  Follow-up as needed.
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
## 2021-04-30 DIAGNOSIS — F4320 Adjustment disorder, unspecified: Secondary | ICD-10-CM | POA: Diagnosis not present

## 2021-05-03 ENCOUNTER — Other Ambulatory Visit (INDEPENDENT_AMBULATORY_CARE_PROVIDER_SITE_OTHER): Payer: Self-pay | Admitting: Family Medicine

## 2021-05-03 DIAGNOSIS — R7309 Other abnormal glucose: Secondary | ICD-10-CM

## 2021-05-03 NOTE — Telephone Encounter (Signed)
Last seen [name] 

## 2021-05-04 DIAGNOSIS — R7309 Other abnormal glucose: Secondary | ICD-10-CM | POA: Insufficient documentation

## 2021-05-04 NOTE — Progress Notes (Signed)
Chief Complaint:   OBESITY Shawn [name] is here to discuss his progress with his obesity treatment plan along with follow-up of his obesity related diagnoses. Shawn [name] is on the Category 4 Plan and states he is following his eating plan approximately 45% of the time. Shawn [name] states he is not currently exercising.   Today's visit was #: 8 Starting weight: 392 lbs Starting date: 08/10/2020 Today's weight: 387 lbs Today's date: 04/28/2021 Total lbs lost to date: 5 Total lbs lost since last in-office visit: 6  Interim History: Shawn [name] reports poor appetite. It is often difficult to consume all food on plan per day. He has dramatically decreased fast food intake- only 2 times in the last 2 weeks. Previously, in the same time frame, he would eat out 8-9 times.   Subjective:   1. Vitamin D deficiency Cheryl's Vitamin D level was 24.1 on 08/10/2020- well below goal of 50. He is currently taking prescription vitamin D 50,000 IU each week. He denies nausea, vomiting or muscle weakness.  2. Elevated random blood glucose level Shawn [name] has had 3 doses of Ozempic 0.25 mg. He is experiencing GI upset ranging from diarrhea to constipation.  3. At risk for nausea Shawn [name] is at risk for nausea due to GLP and obesity.  Assessment/Plan:   1. Vitamin D deficiency Low Vitamin D level contributes to fatigue and are associated with obesity, breast, and colon cancer. He agrees to continue to take prescription Vitamin D @50 ,000 IU every week for 12 weeks, then take 2,000 IU daily. Shawn [name] will follow-up for routine testing of Vitamin D, at least 2-3 times per year to avoid over-replacement.  - Vitamin D, Ergocalciferol, (DRISDOL) 1.25 MG (50000 UNIT) CAPS capsule; TAKE ONE CAPSULE BY MOUTH ONCE A WEEK FOR 12 WEEKS. THEN TAKE 2000IU/DAY  Dispense: 12 capsule; Refill: 0  2. Elevated random blood glucose level Refill Ozempic 0.25 mg, as prescribed below.  - Semaglutide,0.25 or 0.5MG /DOS, (OZEMPIC,  0.25 OR 0.5 MG/DOSE,) 2 MG/1.5ML SOPN; Inject 0.25 mg into the skin once a week.  Dispense: 1.5 mL; Refill: 0  3. At risk for nausea Shawn [name] was given approximately 15 minutes of nausea prevention counseling today. Shawn [name] is at risk for nausea due to his new or current medication. He was encouraged to titrate his medication slowly, make sure to stay hydrated, eat smaller portions throughout the day, and avoid high fat meals.    4. Class 3 severe obesity with serious comorbidity and body mass index (BMI) of 50.0 to 59.9 in adult, unspecified obesity type (HCC)  Shawn [name] is currently in the action stage of change. As such, his goal is to continue with weight loss efforts. He has agreed to the Category 4 Plan.   Savings card for [facility] provided to pt. Check fasting labs at next OV.  Exercise goals: No exercise has been prescribed at this time.  Behavioral modification strategies: increasing lean protein intake, decreasing simple carbohydrates, increasing water intake, increasing high fiber foods, meal planning and cooking strategies, keeping healthy foods in the home, and planning for success.  Shawn [name] has agreed to follow-up with our clinic in 3-4 weeks- fasting. He was informed of the importance of frequent follow-up visits to maximize his success with intensive lifestyle modifications for his multiple health conditions.   Objective:   Blood pressure 120/79, pulse 94, temperature 98.5 F (36.9 C), height 5\' 10"  (1.778 m), weight (!) 387 lb (175.5 kg), SpO2 98 %. Body mass index is 55.53 kg/m.  General:  Cooperative, alert, well developed, in no acute distress. HEENT: Conjunctivae and lids unremarkable. Cardiovascular: Regular rhythm.  Lungs: Normal work of breathing. Neurologic: No focal deficits.   Lab Results  Component Value Date   CREATININE 1.21 08/10/2020   BUN 18 08/10/2020   NA 140 08/10/2020   K 4.6 08/10/2020   CL 100 08/10/2020   CO2 27 08/10/2020    Lab Results  Component Value Date   ALT 13 08/10/2020   AST 22 08/10/2020   ALKPHOS 91 08/10/2020   BILITOT 0.3 08/10/2020   Lab Results  Component Value Date   HGBA1C 5.5 08/10/2020   HGBA1C 5.1 12/11/2012   Lab Results  Component Value Date   INSULIN 28.3 (H) 08/10/2020   Lab Results  Component Value Date   TSH 1.020 08/10/2020   Lab Results  Component Value Date   CHOL 212 (H) 08/10/2020   HDL 33 (L) 08/10/2020   LDLCALC 158 (H) 08/10/2020   LDLDIRECT 141 (H) 12/11/2012   TRIG 114 08/10/2020   CHOLHDL 5 03/23/2020   Lab Results  Component Value Date   WBC 5.3 08/10/2020   HGB 13.8 08/10/2020   HCT 41.6 08/10/2020   MCV 85 08/10/2020   PLT 355 08/10/2020   No results found for: IRON, TIBC, FERRITIN   Attestation Statements:   Reviewed by clinician on day of visit: allergies, medications, problem list, medical history, surgical history, family history, social history, and previous encounter notes.  Edmund Hilda, CMA, am acting as transcriptionist for William Hamburger, NP.  I have reviewed the above documentation for accuracy and completeness, and I agree with the above. -  Tanae Petrosky d. Zaydan Papesh, NP-C

## 2021-05-13 DIAGNOSIS — M542 Cervicalgia: Secondary | ICD-10-CM | POA: Diagnosis not present

## 2021-05-13 DIAGNOSIS — M79602 Pain in left arm: Secondary | ICD-10-CM | POA: Diagnosis not present

## 2021-05-13 DIAGNOSIS — M546 Pain in thoracic spine: Secondary | ICD-10-CM | POA: Diagnosis not present

## 2021-05-13 DIAGNOSIS — M79601 Pain in right arm: Secondary | ICD-10-CM | POA: Diagnosis not present

## 2021-05-17 ENCOUNTER — Encounter: Payer: Self-pay | Admitting: Neurology

## 2021-05-18 DIAGNOSIS — M542 Cervicalgia: Secondary | ICD-10-CM | POA: Diagnosis not present

## 2021-05-18 DIAGNOSIS — M79602 Pain in left arm: Secondary | ICD-10-CM | POA: Diagnosis not present

## 2021-05-18 DIAGNOSIS — M79601 Pain in right arm: Secondary | ICD-10-CM | POA: Diagnosis not present

## 2021-05-18 DIAGNOSIS — M546 Pain in thoracic spine: Secondary | ICD-10-CM | POA: Diagnosis not present

## 2021-05-25 DIAGNOSIS — K080 Exfoliation of teeth due to systemic causes: Secondary | ICD-10-CM | POA: Diagnosis not present

## 2021-05-26 ENCOUNTER — Other Ambulatory Visit: Payer: Federal, State, Local not specified - PPO

## 2021-05-26 ENCOUNTER — Inpatient Hospital Stay: Admission: RE | Admit: 2021-05-26 | Payer: Federal, State, Local not specified - PPO | Source: Ambulatory Visit

## 2021-05-27 DIAGNOSIS — M542 Cervicalgia: Secondary | ICD-10-CM | POA: Diagnosis not present

## 2021-05-27 DIAGNOSIS — M79601 Pain in right arm: Secondary | ICD-10-CM | POA: Diagnosis not present

## 2021-05-27 DIAGNOSIS — M546 Pain in thoracic spine: Secondary | ICD-10-CM | POA: Diagnosis not present

## 2021-05-27 DIAGNOSIS — M79602 Pain in left arm: Secondary | ICD-10-CM | POA: Diagnosis not present

## 2021-05-31 ENCOUNTER — Other Ambulatory Visit: Payer: Self-pay | Admitting: Family Medicine

## 2021-05-31 ENCOUNTER — Ambulatory Visit
Admission: RE | Admit: 2021-05-31 | Discharge: 2021-05-31 | Disposition: A | Discharge status: Home / Self Care | Payer: Federal, State, Local not specified - PPO | Source: Ambulatory Visit | Attending: Family Medicine | Admitting: Family Medicine

## 2021-05-31 ENCOUNTER — Other Ambulatory Visit: Payer: Self-pay

## 2021-05-31 DIAGNOSIS — M546 Pain in thoracic spine: Secondary | ICD-10-CM

## 2021-05-31 DIAGNOSIS — M4694 Unspecified inflammatory spondylopathy, thoracic region: Secondary | ICD-10-CM | POA: Diagnosis not present

## 2021-05-31 IMAGING — XA DG FACET JT INJ C&T SPINE SINGLE LEVEL *R*
3 series · 3 of 3 positions shown · non-contrast
Comparison: none

CLINICAL DATA: T4-5 facet arthropathy bilaterally.

[Series 1: ortho adipose · 1 of 1 slices shown (1 of 3)]
[im 1/1]
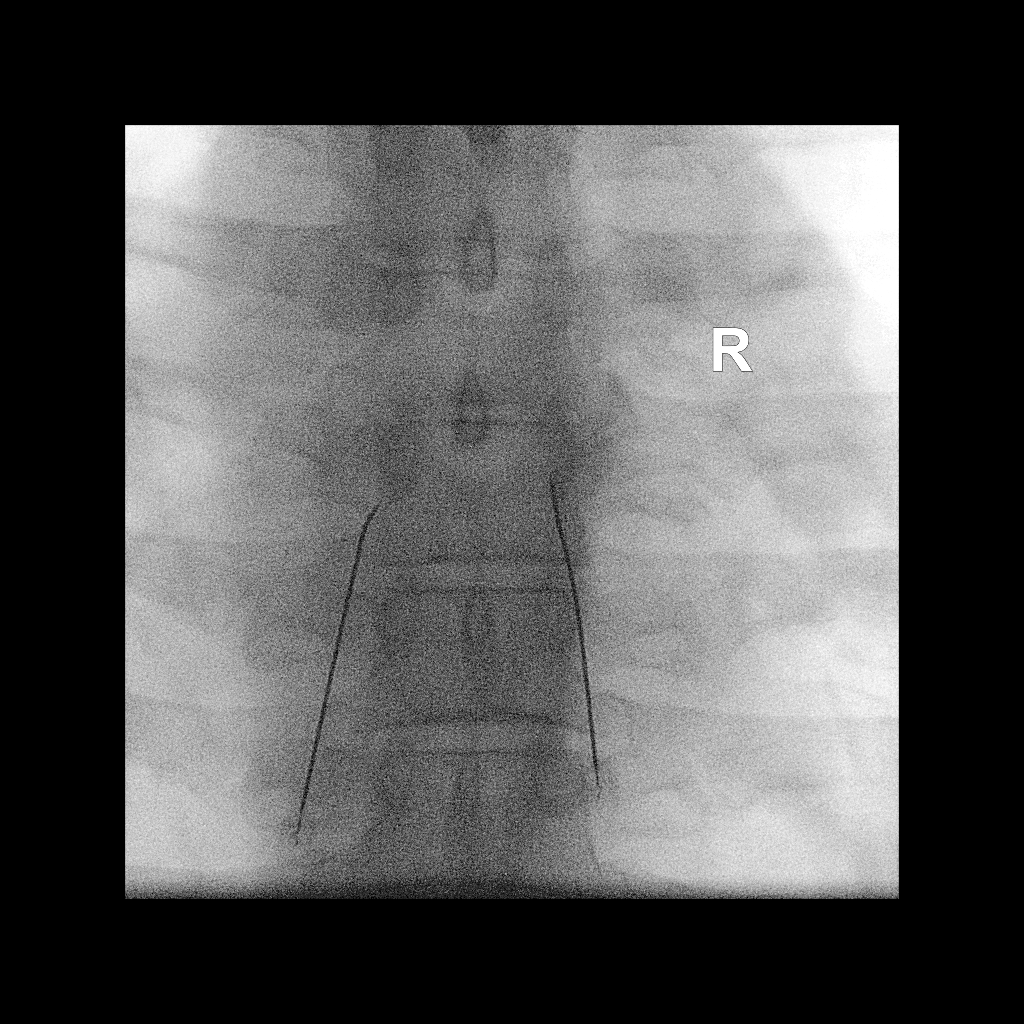

[Series 2: ortho adipose · 1 of 1 slices shown (2 of 3)]
[im 1/1]
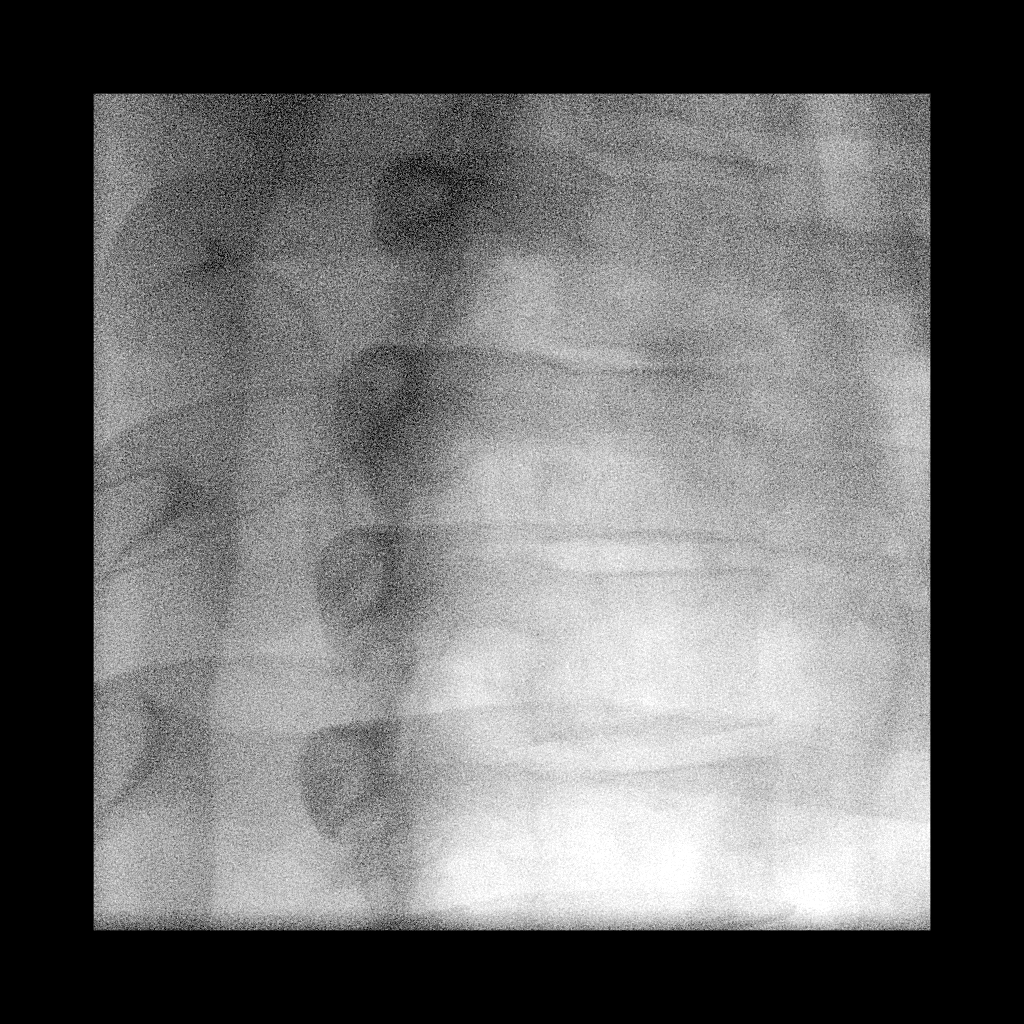

[Series 3: ortho adipose · 1 of 1 slices shown (3 of 3)]
[im 1/1]
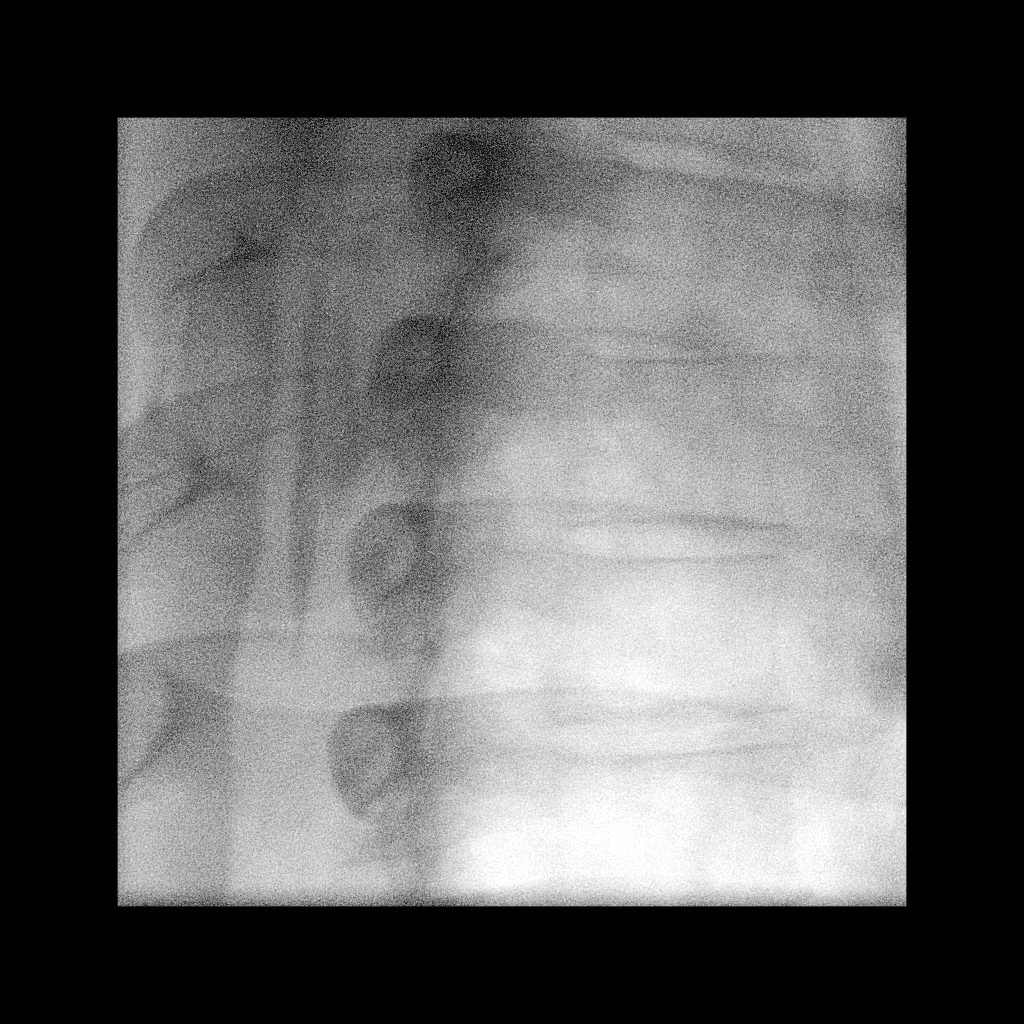

[3 of 3 positions shown; findings below may reference images not displayed]

EXAM:
BILATERAL T4-5 FACET INJECTION

A posterior approach was taken to the facet on the right at T4-5
using a 5 inch 22 gauge spinal needle. Intra-articular positioning
was confirmed by injecting a drop of Isovue-M 300. No vascular
opacification is seen. 0.5 ml of Kenalog 40 mixed with a few drops
of 1% Lidocaine were instilled into the joint.

A posterior approach was taken to the facet on the left at T4-5
using a 5 inch 22 gauge spinal needle. Intra-articular positioning
was confirmed by injecting a drop of Isovue-M 300. No vascular
opacification is seen. 0.5 ml of Kenalog 40 mixed with a few drops
of 1% Lidocaine were instilled into the joint.

The procedure was well-tolerated. The patient was discharged twenty
minutes following the injection in good condition.

FLUOROSCOPY TIME:  Radiation Exposure Index (as provided by the
fluoroscopic device): 348.78 uGy*m2
IMPRESSION: Technically successful bilateral T4-5 facet injection.

## 2021-05-31 IMAGING — XA DG FACET JT INJ C&T SPINE SINGLE LEVEL *L*
3 series · 3 of 3 positions shown · non-contrast
Comparison: none

CLINICAL DATA: T4-5 facet arthropathy bilaterally.

[Series 1: ortho adipose · 1 of 1 slices shown (1 of 3)]
[im 1/1]
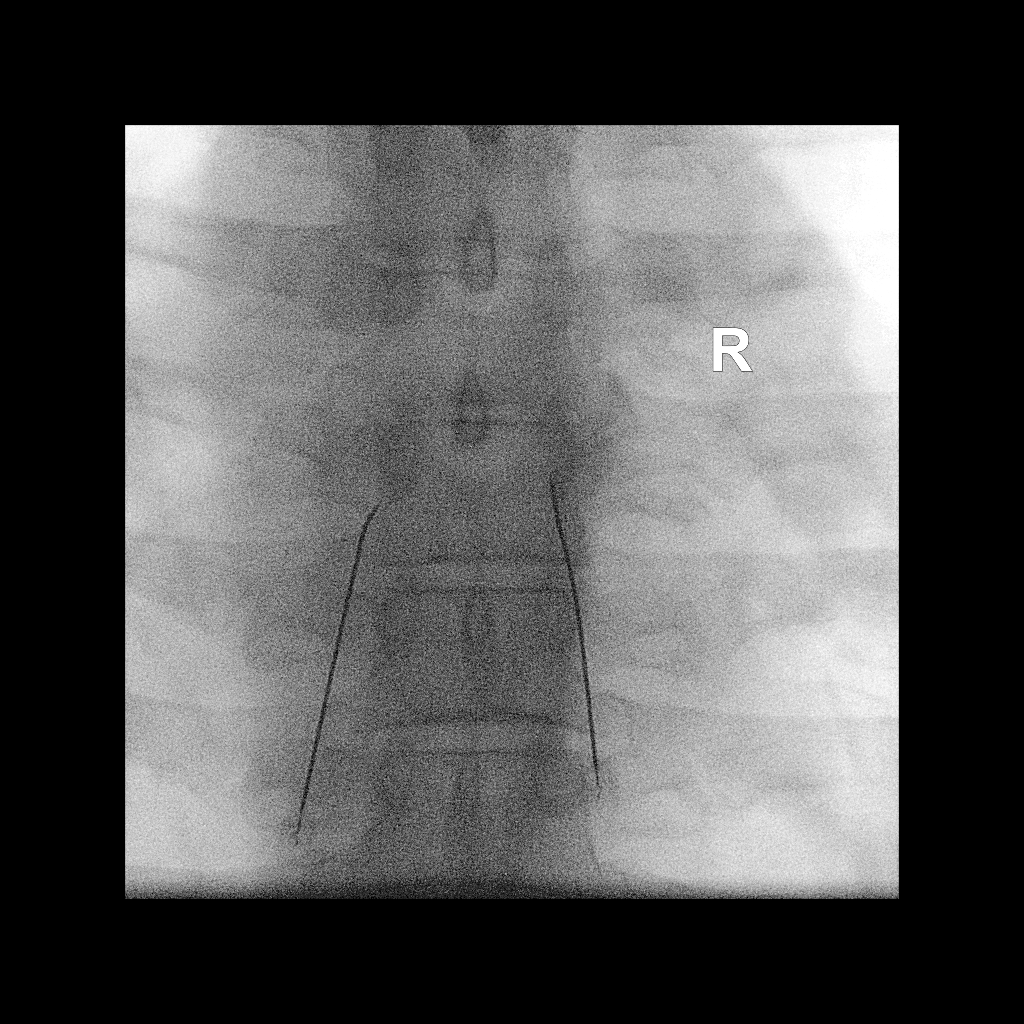

[Series 2: ortho adipose · 1 of 1 slices shown (2 of 3)]
[im 1/1]
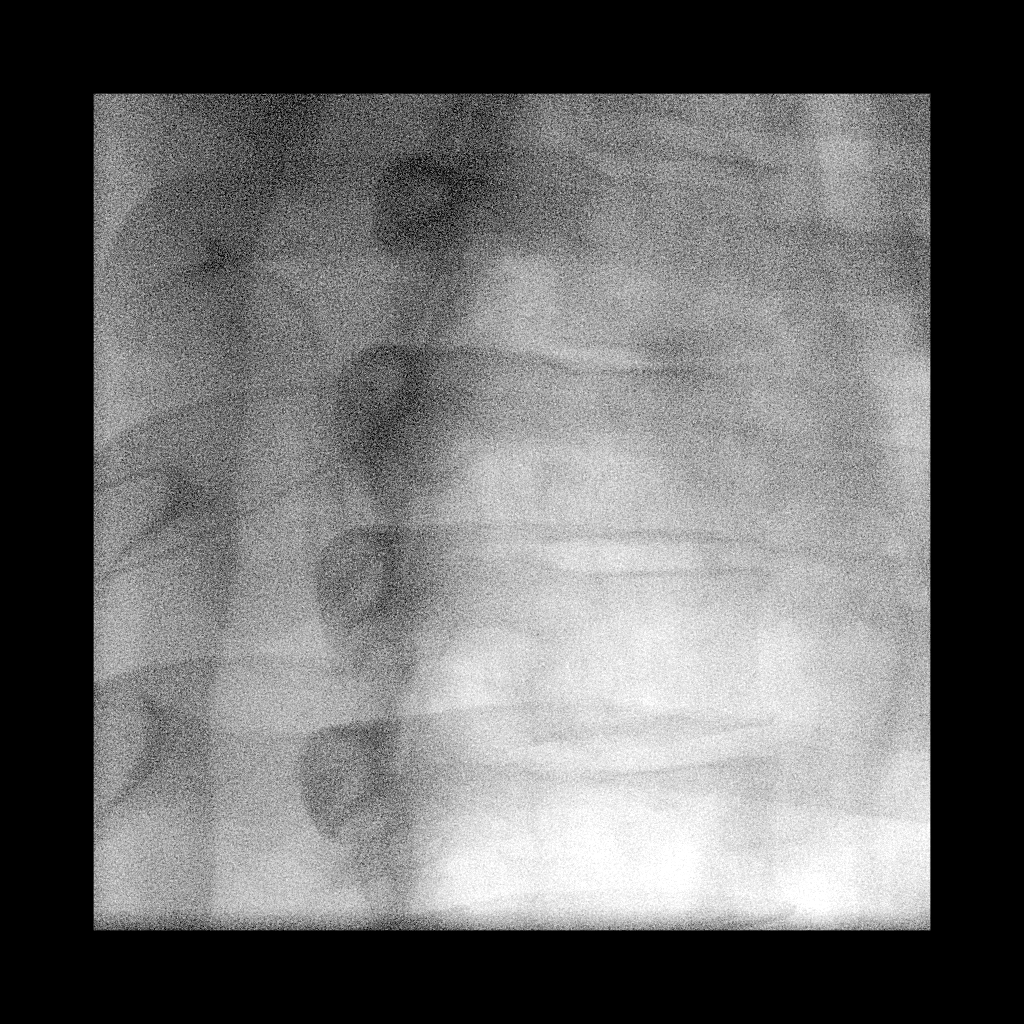

[Series 3: ortho adipose · 1 of 1 slices shown (3 of 3)]
[im 1/1]
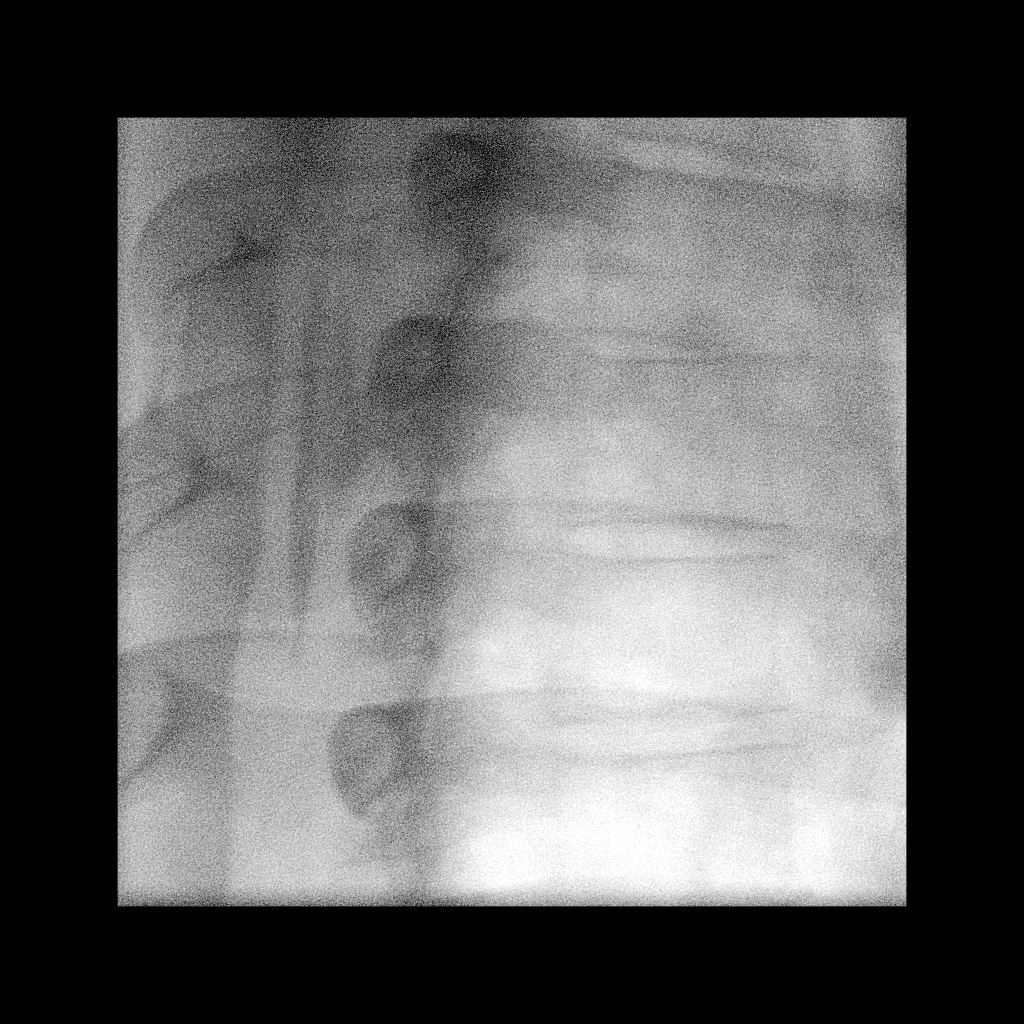

[3 of 3 positions shown; findings below may reference images not displayed]

EXAM:
BILATERAL T4-5 FACET INJECTION

A posterior approach was taken to the facet on the right at T4-5
using a 5 inch 22 gauge spinal needle. Intra-articular positioning
was confirmed by injecting a drop of Isovue-M 300. No vascular
opacification is seen. 0.5 ml of Kenalog 40 mixed with a few drops
of 1% Lidocaine were instilled into the joint.

A posterior approach was taken to the facet on the left at T4-5
using a 5 inch 22 gauge spinal needle. Intra-articular positioning
was confirmed by injecting a drop of Isovue-M 300. No vascular
opacification is seen. 0.5 ml of Kenalog 40 mixed with a few drops
of 1% Lidocaine were instilled into the joint.

The procedure was well-tolerated. The patient was discharged twenty
minutes following the injection in good condition.

FLUOROSCOPY TIME:  Radiation Exposure Index (as provided by the
fluoroscopic device): 348.78 uGy*m2
IMPRESSION: Technically successful bilateral T4-5 facet injection.

## 2021-05-31 MED ORDER — TRIAMCINOLONE ACETONIDE 40 MG/ML IJ SUSP (RADIOLOGY)
60.0000 mg | Freq: Once | INTRAMUSCULAR | Status: AC
  Administered 2021-05-31: 60 mg via INTRA_ARTICULAR

## 2021-05-31 MED ORDER — IOPAMIDOL (ISOVUE-M 300) INJECTION 61%
1.0000 mL | Freq: Once | INTRAMUSCULAR | Status: AC | PRN
  Administered 2021-05-31: 1 mL via INTRA_ARTICULAR

## 2021-05-31 NOTE — Discharge Instructions (Signed)

## 2021-06-01 ENCOUNTER — Other Ambulatory Visit: Payer: Self-pay

## 2021-06-01 ENCOUNTER — Ambulatory Visit (INDEPENDENT_AMBULATORY_CARE_PROVIDER_SITE_OTHER): Payer: Federal, State, Local not specified - PPO | Admitting: Adult Health

## 2021-06-01 VITALS — BP 136/84 | HR 103 | Temp 98.2°F | Ht 71.0 in | Wt 383.0 lb

## 2021-06-01 DIAGNOSIS — E559 Vitamin D deficiency, unspecified: Secondary | ICD-10-CM

## 2021-06-01 DIAGNOSIS — R7309 Other abnormal glucose: Secondary | ICD-10-CM | POA: Diagnosis not present

## 2021-06-01 DIAGNOSIS — F339 Major depressive disorder, recurrent, unspecified: Secondary | ICD-10-CM

## 2021-06-01 DIAGNOSIS — Z9189 Other specified personal risk factors, not elsewhere classified: Secondary | ICD-10-CM

## 2021-06-01 DIAGNOSIS — I10 Essential (primary) hypertension: Secondary | ICD-10-CM | POA: Diagnosis not present

## 2021-06-01 DIAGNOSIS — Z6841 Body Mass Index (BMI) 40.0 and over, adult: Secondary | ICD-10-CM

## 2021-06-01 MED ORDER — OZEMPIC (0.25 OR 0.5 MG/DOSE) 2 MG/1.5ML ~~LOC~~ SOPN: 0.2500 mg | PEN_INJECTOR | SUBCUTANEOUS | 0 refills | Status: DC

## 2021-06-02 LAB — VITAMIN D 25 HYDROXY (VIT D DEFICIENCY, FRACTURES): Vit D, 25-Hydroxy: 30.7 ng/mL (ref 30.0–100.0)

## 2021-06-02 LAB — INSULIN, RANDOM: INSULIN: 138 u[IU]/mL — ABNORMAL HIGH (ref 2.6–24.9)

## 2021-06-02 LAB — HEMOGLOBIN A1C
Est. average glucose Bld gHb Est-mCnc: 114 mg/dL
Hgb A1c MFr Bld: 5.6 % (ref 4.8–5.6)

## 2021-06-03 NOTE — Progress Notes (Signed)
Chief Complaint:   OBESITY Shawn [name] is here to discuss his progress with his obesity treatment plan along with follow-up of his obesity related diagnoses. Shawn [name] is on the Category 4 Plan and states he is following his eating plan approximately 0% of the time. Shawn [name] states he is not currently exercising.  Today's visit was #: 9 Starting weight: 392 lbs Starting date: 08/10/2020 Today's weight: 383 lbs Today's date: 06/01/2021 Total lbs lost to date: 9 Total lbs lost since last in-office visit: 3  Interim History:  Shawn [name] was started on Ozempic 0.25 mg on 04/06/2021 for IFG and he is experiencing poor appetite. He estimates to eat only once a day 30% of the week.  Subjective:   1. Vitamin D deficiency 08/10/20 Vit D level was 24.1 - well below goal of 50. He is currently taking prescription vitamin D 50,000 IU each week. He denies nausea, vomiting or muscle weakness.   2. Depression, recurrent [facility]) April 2022, Shawn [name] weaned off sertraline due to poor symptom control and started on bupropion, eventually titrated up to 300 mg QD. Of Note- previously failed venlafaxine and sertraline.   3. Elevated random blood glucose level Shawn [name] reports poor appetite approximately 30% of the week with Ozempic 0.25 mg once a week.  His GI upset resolved (alternating constipation and diarrhea).   4. Essential hypertension BP/HR stable. He is on HCTZ 25 mg and lisinopril 40 mg.  5. At risk for deficient intake of food Shawn [name] is at risk for deficient intake of food due to poor appetite due to GLP-1 therapy for IFG.  Assessment/Plan:   1. Vitamin D deficiency Low Vitamin D level contributes to fatigue and are associated with obesity, breast, and colon cancer. He agrees to continue to take prescription Vitamin D @50 ,000 IU every week and will follow-up for routine testing of Vitamin D, at least 2-3 times per year to avoid over-replacement. Check labs today.  - VITAMIN D 25  Hydroxy (Vit-D Deficiency, Fractures)  2. Depression, recurrent (HCC) Behavior modification techniques were discussed today to help Shawn [name] deal with his emotional/non-hunger eating behaviors.  Orders and follow up as documented in patient record.  Continue bupropion XL 300 mg QD, managed by PCP.  3. Elevated random blood glucose level Check labs today. Refill Ozempic 0.25 mg weekly, as prescribed below.  Do not recommend increasing dose until appetite increases.   - Hemoglobin A1c - Insulin, random   Refill- Semaglutide,0.25 or 0.5MG /DOS, (OZEMPIC, 0.25 OR 0.5 MG/DOSE,) 2 MG/1.5ML SOPN; Inject 0.25 mg into the skin once a week.  Dispense: 1.5 mL; Refill: 0  4. Essential hypertension Shawn [name] is working on healthy weight loss and exercise to improve blood pressure control. We will watch for signs of hypotension as he continues his lifestyle modifications. Check labs today.  - Comprehensive metabolic panel; Future  5. At risk for deficient intake of food Shawn [name] was given approximately 15 minutes of deficit intake of food prevention counseling today. Shawn [name] is at risk for eating too few calories based on current food recall. He was encouraged to focus on meeting caloric and protein goals according to his recommended meal plan.    6. Class 3 severe obesity with serious comorbidity and body mass index (BMI) of 50.0 to 59.9 in adult, unspecified obesity type (HCC)  Shawn [name] is currently in the action stage of change. As such, his goal is to continue with weight loss efforts. He has agreed to the Category 4 Plan.   Really focus on eating  at least 75% of prescribed food daily.  Exercise goals:  As is  Behavioral modification strategies: increasing lean protein intake, decreasing simple carbohydrates, no skipping meals, meal planning and cooking strategies, keeping healthy foods in the home, and planning for success.  Shawn [name] has agreed to follow-up with our clinic in 3 weeks. He was  informed of the importance of frequent follow-up visits to maximize his success with intensive lifestyle modifications for his multiple health conditions.   Objective:   Blood pressure 136/84, pulse (!) 103, temperature 98.2 F (36.8 C), height 5\' 11"  (1.803 m), weight (!) 383 lb (173.7 kg), SpO2 98 %. Body mass index is 53.42 kg/m.  General: Cooperative, alert, well developed, in no acute distress. HEENT: Conjunctivae and lids unremarkable. Cardiovascular: Regular rhythm.  Lungs: Normal work of breathing. Neurologic: No focal deficits.   Lab Results  Component Value Date   CREATININE 1.21 08/10/2020   BUN 18 08/10/2020   NA 140 08/10/2020   K 4.6 08/10/2020   CL 100 08/10/2020   CO2 27 08/10/2020   Lab Results  Component Value Date   ALT 13 08/10/2020   AST 22 08/10/2020   ALKPHOS 91 08/10/2020   BILITOT 0.3 08/10/2020   Lab Results  Component Value Date   HGBA1C 5.6 06/01/2021   HGBA1C 5.5 08/10/2020   HGBA1C 5.1 12/11/2012   Lab Results  Component Value Date   INSULIN 138.0 (H) 06/01/2021   INSULIN 28.3 (H) 08/10/2020   Lab Results  Component Value Date   TSH 1.020 08/10/2020   Lab Results  Component Value Date   CHOL 212 (H) 08/10/2020   HDL 33 (L) 08/10/2020   LDLCALC 158 (H) 08/10/2020   LDLDIRECT 141 (H) 12/11/2012   TRIG 114 08/10/2020   CHOLHDL 5 03/23/2020   Lab Results  Component Value Date   VD25OH 30.7 06/01/2021   VD25OH 24.1 (L) 08/10/2020   VD25OH 11.43 (L) 03/23/2020   Lab Results  Component Value Date   WBC 5.3 08/10/2020   HGB 13.8 08/10/2020   HCT 41.6 08/10/2020   MCV 85 08/10/2020   PLT 355 08/10/2020   Attestation Statements:   Reviewed by clinician on day of visit: allergies, medications, problem list, medical history, surgical history, family history, social history, and previous encounter notes.  08/12/2020, CMA, am acting as transcriptionist for Edmund Hilda, NP.  I have reviewed the above documentation for  accuracy and completeness, and I agree with the above. -  Chelbi Herber d. Lexia Vandevender, NP-C

## 2021-06-04 ENCOUNTER — Other Ambulatory Visit (INDEPENDENT_AMBULATORY_CARE_PROVIDER_SITE_OTHER): Payer: Self-pay | Admitting: Adult Health

## 2021-06-04 DIAGNOSIS — F4320 Adjustment disorder, unspecified: Secondary | ICD-10-CM | POA: Diagnosis not present

## 2021-06-04 DIAGNOSIS — R7309 Other abnormal glucose: Secondary | ICD-10-CM

## 2021-06-07 NOTE — Telephone Encounter (Signed)
Pt last seen by [name] Danford, FNP.  

## 2021-06-08 DIAGNOSIS — M79602 Pain in left arm: Secondary | ICD-10-CM | POA: Diagnosis not present

## 2021-06-08 DIAGNOSIS — M542 Cervicalgia: Secondary | ICD-10-CM | POA: Diagnosis not present

## 2021-06-08 DIAGNOSIS — M79601 Pain in right arm: Secondary | ICD-10-CM | POA: Diagnosis not present

## 2021-06-08 DIAGNOSIS — M546 Pain in thoracic spine: Secondary | ICD-10-CM | POA: Diagnosis not present

## 2021-06-09 DIAGNOSIS — G4733 Obstructive sleep apnea (adult) (pediatric): Secondary | ICD-10-CM | POA: Diagnosis not present

## 2021-06-11 DIAGNOSIS — F4320 Adjustment disorder, unspecified: Secondary | ICD-10-CM | POA: Diagnosis not present

## 2021-06-22 ENCOUNTER — Ambulatory Visit (INDEPENDENT_AMBULATORY_CARE_PROVIDER_SITE_OTHER): Payer: Federal, State, Local not specified - PPO | Admitting: Adult Health

## 2021-06-22 ENCOUNTER — Encounter (INDEPENDENT_AMBULATORY_CARE_PROVIDER_SITE_OTHER): Payer: Self-pay | Admitting: Adult Health

## 2021-06-22 ENCOUNTER — Other Ambulatory Visit: Payer: Self-pay

## 2021-06-22 VITALS — BP 115/74 | HR 88 | Temp 98.2°F | Ht 71.0 in | Wt 374.0 lb

## 2021-06-22 DIAGNOSIS — R7309 Other abnormal glucose: Secondary | ICD-10-CM

## 2021-06-22 DIAGNOSIS — E8881 Metabolic syndrome: Secondary | ICD-10-CM

## 2021-06-22 DIAGNOSIS — I10 Essential (primary) hypertension: Secondary | ICD-10-CM

## 2021-06-22 DIAGNOSIS — E559 Vitamin D deficiency, unspecified: Secondary | ICD-10-CM | POA: Diagnosis not present

## 2021-06-22 DIAGNOSIS — Z9189 Other specified personal risk factors, not elsewhere classified: Secondary | ICD-10-CM | POA: Diagnosis not present

## 2021-06-22 DIAGNOSIS — Z6841 Body Mass Index (BMI) 40.0 and over, adult: Secondary | ICD-10-CM

## 2021-06-22 MED ORDER — OZEMPIC (0.25 OR 0.5 MG/DOSE) 2 MG/1.5ML ~~LOC~~ SOPN: 0.5000 mg | PEN_INJECTOR | SUBCUTANEOUS | 0 refills | Status: DC

## 2021-06-24 DIAGNOSIS — M79601 Pain in right arm: Secondary | ICD-10-CM | POA: Diagnosis not present

## 2021-06-24 DIAGNOSIS — M542 Cervicalgia: Secondary | ICD-10-CM | POA: Diagnosis not present

## 2021-06-24 DIAGNOSIS — M546 Pain in thoracic spine: Secondary | ICD-10-CM | POA: Diagnosis not present

## 2021-06-24 DIAGNOSIS — M79602 Pain in left arm: Secondary | ICD-10-CM | POA: Diagnosis not present

## 2021-06-28 NOTE — Progress Notes (Signed)
Chief Complaint:   OBESITY Shawn [name] is here to discuss his progress with his obesity treatment plan along with follow-up of his obesity related diagnoses. [name] is on the Category 4 Plan and states he is following his eating plan approximately 40% of the time. [name] states he is not currently exercising.  Today's visit was #: 10 Starting weight: 392 lbs Starting date: 08/10/2020 Today's weight: 374 lbs Today's date: 06/22/2021 Total lbs lost to date: 18 Total lbs lost since last in-office visit: 9  Interim History: Shawn [name] was started on Ozempic 0.25 mg on 04/06/2021.  He is still challenged to consume breakfast, but he is able to finish lunch and dinner now.  Subjective:   1. Elevated random blood glucose level [name] was started on Ozempic 0.25 mg once weekly on 04/06/2021. His appetite has improved.  2. Essential hypertension Discussed labs with patient today. BP/HR at goal. Shawn [name] is on lisinopril 40 mg QD and HCTZ 25 mg QD.  BP Readings from Last 3 Encounters:  06/22/21 115/74  06/01/21 136/84  05/31/21 (!) 146/98   Lab Results  Component Value Date   CREATININE 1.21 08/10/2020   CREATININE 1.12 03/23/2020   CREATININE 1.31 (H) 02/26/2020   3. Vitamin D deficiency Discussed labs with patient today. 06/01/2021 Vit D level was 30.7 - below goal of 50. He is consistently taking prescription vitamin D 50,000 IU each week. He denies nausea, vomiting or muscle weakness.   Lab Results  Component Value Date   VD25OH 30.7 06/01/2021   VD25OH 24.1 (L) 08/10/2020   VD25OH 11.43 (L) 03/23/2020   4. Insulin resistance Worsening. Discussed labs with patient today. 06/01/2021 A1c 5.6- high normal. Acey's insulin level jumped to 138.0. He is on Ozempic- today increased to 0.5 mg weekly.  Lab Results  Component Value Date   INSULIN 138.0 (H) 06/01/2021   INSULIN 28.3 (H) 08/10/2020   Lab Results  Component Value Date   HGBA1C 5.6 06/01/2021   5. At risk  for constipation [name] is at increased risk for constipation due to increased GLP-1 for impaired fasting glucose.  Assessment/Plan:   1. Elevated random blood glucose level Increase Ozempic to 0.5 mg.  Refill- Semaglutide,0.25 or 0.5MG /DOS, (OZEMPIC, 0.25 OR 0.5 MG/DOSE,) 2 MG/1.5ML SOPN;  Inject 0.5 mg into the skin once a week.  Dispense: 3 mL; Refill: 0  2. Essential hypertension Shawn [name] is working on healthy weight loss and exercise to improve blood pressure control. We will watch for signs of hypotension as he continues his lifestyle modifications. Continue ACE and diuretic as directed.  3. Vitamin D deficiency Low Vitamin D level contributes to fatigue and are associated with obesity, breast, and colon cancer. He agrees to continue to take prescription Vitamin D 50,000 IU every week and will follow-up for routine testing of Vitamin D, at least 2-3 times per year to avoid over-replacement. No refill needed at this time.  4. Insulin resistance Shawn [name] will increase protein, continue to work on weight loss, increase daily walking, and decreasing simple carbohydrates to help decrease the risk of diabetes. [name] agreed to follow-up with [place] as directed to closely monitor his progress.  5. At risk for constipation Shawn [name] was given approximately 15 minutes of counseling today regarding prevention of constipation. He was encouraged to increase water and fiber intake.    6. Class 3 severe obesity with serious comorbidity and body mass index (BMI) of 50.0 to 59.9 in adult, unspecified obesity type (HCC)  Shawn [name] is currently in  the action stage of change. As such, his goal is to continue with weight loss efforts. He has agreed to the Category 4 Plan.   Pt uses food scale to measure out lunch/dinner protein. Try to consume protein shake in morning.  Exercise goals:  Increase daily walking 5-10 minutes  Behavioral modification strategies: increasing lean protein intake, decreasing  simple carbohydrates, meal planning and cooking strategies, keeping healthy foods in the home, and planning for success.  [name] has agreed to follow-up with our clinic in 3 weeks. He was informed of the importance of frequent follow-up visits to maximize his success with intensive lifestyle modifications for his multiple health conditions.   Objective:   Blood pressure 115/74, pulse 88, temperature 98.2 F (36.8 C), height 5\' 11"  (1.803 m), weight (!) 374 lb (169.6 kg), SpO2 96 %. Body mass index is 52.16 kg/m.  General: Cooperative, alert, well developed, in no acute distress. HEENT: Conjunctivae and lids unremarkable. Cardiovascular: Regular rhythm.  Lungs: Normal work of breathing. Neurologic: No focal deficits.   Lab Results  Component Value Date   CREATININE 1.21 08/10/2020   BUN 18 08/10/2020   NA 140 08/10/2020   K 4.6 08/10/2020   CL 100 08/10/2020   CO2 27 08/10/2020   Lab Results  Component Value Date   ALT 13 08/10/2020   AST 22 08/10/2020   ALKPHOS 91 08/10/2020   BILITOT 0.3 08/10/2020   Lab Results  Component Value Date   HGBA1C 5.6 06/01/2021   HGBA1C 5.5 08/10/2020   HGBA1C 5.1 12/11/2012   Lab Results  Component Value Date   INSULIN 138.0 (H) 06/01/2021   INSULIN 28.3 (H) 08/10/2020   Lab Results  Component Value Date   TSH 1.020 08/10/2020   Lab Results  Component Value Date   CHOL 212 (H) 08/10/2020   HDL 33 (L) 08/10/2020   LDLCALC 158 (H) 08/10/2020   LDLDIRECT 141 (H) 12/11/2012   TRIG 114 08/10/2020   CHOLHDL 5 03/23/2020   Lab Results  Component Value Date   VD25OH 30.7 06/01/2021   VD25OH 24.1 (L) 08/10/2020   VD25OH 11.43 (L) 03/23/2020   Lab Results  Component Value Date   WBC 5.3 08/10/2020   HGB 13.8 08/10/2020   HCT 41.6 08/10/2020   MCV 85 08/10/2020   PLT 355 08/10/2020    Attestation Statements:   Reviewed by clinician on day of visit: allergies, medications, problem list, medical history, surgical history,  family history, social history, and previous encounter notes.  08/12/2020, CMA, am acting as transcriptionist for Edmund Hilda, NP.  I have reviewed the above documentation for accuracy and completeness, and I agree with the above. -  Amaranta Mehl d. Adline Kirshenbaum, NP-C
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
## 2021-07-01 ENCOUNTER — Ambulatory Visit (INDEPENDENT_AMBULATORY_CARE_PROVIDER_SITE_OTHER): Payer: Federal, State, Local not specified - PPO | Admitting: Family Medicine

## 2021-07-01 ENCOUNTER — Other Ambulatory Visit: Payer: Self-pay

## 2021-07-01 ENCOUNTER — Encounter: Payer: Self-pay | Admitting: Family Medicine

## 2021-07-01 ENCOUNTER — Ambulatory Visit (INDEPENDENT_AMBULATORY_CARE_PROVIDER_SITE_OTHER): Payer: Federal, State, Local not specified - PPO

## 2021-07-01 VITALS — BP 110/84 | HR 84 | Ht 71.0 in | Wt 381.2 lb

## 2021-07-01 DIAGNOSIS — M79601 Pain in right arm: Secondary | ICD-10-CM | POA: Diagnosis not present

## 2021-07-01 DIAGNOSIS — M546 Pain in thoracic spine: Secondary | ICD-10-CM | POA: Diagnosis not present

## 2021-07-01 DIAGNOSIS — M79671 Pain in right foot: Secondary | ICD-10-CM | POA: Diagnosis not present

## 2021-07-01 DIAGNOSIS — M79602 Pain in left arm: Secondary | ICD-10-CM | POA: Diagnosis not present

## 2021-07-01 DIAGNOSIS — M542 Cervicalgia: Secondary | ICD-10-CM | POA: Diagnosis not present

## 2021-07-01 DIAGNOSIS — M19071 Primary osteoarthritis, right ankle and foot: Secondary | ICD-10-CM | POA: Diagnosis not present

## 2021-07-01 NOTE — Patient Instructions (Signed)
Thank you for coming in today.   Please get an Xray today before you leave   Please call [facility] at [phone] to schedule your spine injection.    Use good arch support.  Good insoles like Superfeet.  I gave you hapad scaphoid pads size medium.    Please use Voltaren gel (Generic Diclofenac Gel) up to 4x daily for pain as needed.  This is available over-the-counter as both the name brand Voltaren gel and the generic diclofenac gel.   Peroneal Tendinopathy Rehab Ask your health care provider which exercises are safe for you. Do exercises exactly as told by your health care provider and adjust them as directed. It is normal to feel mild stretching, pulling, tightness, or discomfort as you do these exercises. Stop right away if you feel sudden pain or your pain gets worse. Do not begin these exercises until told by your health care provider. Stretching and range-of-motion exercises These exercises warm up your muscles and joints and improve the movement and flexibility of your ankle. These exercises also help to relieve pain andstiffness. Gastroc and soleus stretch, standing This is an exercise in which you stand on a step and use your body weight to stretch your calf muscles. To do this exercise: Stand on the edge of a step on the ball of your left / right foot. The ball of your foot is on the walking surface, right under your toes. Keep your other foot firmly on the same step. Hold on to the wall, a railing, or a chair for balance. Slowly lift your other foot, allowing your body weight to press your left / right heel down over the edge of the step. You should feel a stretch in your left / right calf (gastrocnemius and soleus). Hold this position for __________ seconds. Return both feet to the step. Repeat this exercise with a slight bend in your left / right knee. Repeat __________ times with your left / right knee straight and __________ times with your left / right knee bent.  Complete this exercise __________ timesa day. Strengthening exercises These exercises build strength and endurance in your foot and ankle. Enduranceis the ability to use your muscles for a long time, even after they get tired. Ankle dorsiflexion with band  Secure a rubber exercise band or tube to an object, such as a table leg, that will not move when the band is pulled. Secure the other end of the band around your left / right foot. Sit on the floor, facing the object with your left / right leg extended. The band or tube should be slightly tense when your foot is relaxed. Slowly flex your left / right ankle and toes to bring your foot toward you (dorsiflexion). Hold this position for __________ seconds. Let the band or tube slowly pull your foot back to the starting position. Repeat __________ times. Complete this exercise __________ times a day. Ankle eversion Sit on the floor with your legs straight out in front of you. Loop a rubber exercise band or tube around the ball of your left / right foot. The ball of your foot is on the walking surface, right under your toes. Hold the ends of the band in your hands, or secure the band to a stable object. The band or tube should be slightly tense when your foot is relaxed. Slowly push your foot outward, away from your other leg (eversion). Hold this position for __________ seconds. Slowly return your foot to the starting position.  Repeat __________ times. Complete this exercise __________ times a day. Plantar flexion, standing This exercise is sometimes called standing heel raise. Stand with your feet shoulder-width apart. Place your hands on a wall or table to steady yourself as needed, but try not to use it for support. Keep your weight spread evenly over the width of your feet while you slowly rise up on your toes (plantar flexion). If told by your health care provider: Shift your weight toward your left / right leg until you feel  challenged. Stand on your left / right leg only. Hold this position for __________ seconds. Repeat __________ times. Complete this exercise __________ times a day. Single leg stand Without shoes, stand near a railing or in a doorway. You may hold on to the railing or door frame as needed. Stand on your left / right foot. Keep your big toe down on the floor and try to keep your arch lifted. Do not roll to the outside of your foot. If this exercise is too easy, you can try it with your eyes closed or while standing on a pillow. Hold this position for __________ seconds. Repeat __________ times. Complete this exercise __________ times a day. This information is not intended to replace advice given to you by your health care provider. Make sure you discuss any questions you have with your healthcare provider. Document Revised: 02/26/2019 Document Reviewed: 02/26/2019 Elsevier Patient Education  2022 [employer].   Recheck in 1 month.   If not better there is more to do.

## 2021-07-02 DIAGNOSIS — F4320 Adjustment disorder, unspecified: Secondary | ICD-10-CM | POA: Diagnosis not present

## 2021-07-04 ENCOUNTER — Other Ambulatory Visit (INDEPENDENT_AMBULATORY_CARE_PROVIDER_SITE_OTHER): Payer: Self-pay | Admitting: Adult Health

## 2021-07-04 DIAGNOSIS — R7309 Other abnormal glucose: Secondary | ICD-10-CM

## 2021-07-05 ENCOUNTER — Encounter: Payer: Self-pay | Admitting: Family Medicine

## 2021-07-05 NOTE — Progress Notes (Signed)
Right foot x-ray shows some mild arthritis changes

## 2021-07-08 DIAGNOSIS — M79602 Pain in left arm: Secondary | ICD-10-CM | POA: Diagnosis not present

## 2021-07-08 DIAGNOSIS — M79601 Pain in right arm: Secondary | ICD-10-CM | POA: Diagnosis not present

## 2021-07-08 DIAGNOSIS — M546 Pain in thoracic spine: Secondary | ICD-10-CM | POA: Diagnosis not present

## 2021-07-08 DIAGNOSIS — M542 Cervicalgia: Secondary | ICD-10-CM | POA: Diagnosis not present

## 2021-07-10 DIAGNOSIS — G4733 Obstructive sleep apnea (adult) (pediatric): Secondary | ICD-10-CM | POA: Diagnosis not present

## 2021-07-12 ENCOUNTER — Ambulatory Visit (INDEPENDENT_AMBULATORY_CARE_PROVIDER_SITE_OTHER): Payer: Federal, State, Local not specified - PPO | Admitting: Adult Health

## 2021-07-12 ENCOUNTER — Other Ambulatory Visit: Payer: Self-pay

## 2021-07-12 ENCOUNTER — Encounter (INDEPENDENT_AMBULATORY_CARE_PROVIDER_SITE_OTHER): Payer: Self-pay | Admitting: Adult Health

## 2021-07-12 VITALS — BP 138/80 | HR 88 | Temp 99.0°F | Ht 71.0 in | Wt 372.0 lb

## 2021-07-12 DIAGNOSIS — E559 Vitamin D deficiency, unspecified: Secondary | ICD-10-CM

## 2021-07-12 DIAGNOSIS — I10 Essential (primary) hypertension: Secondary | ICD-10-CM | POA: Diagnosis not present

## 2021-07-12 DIAGNOSIS — R739 Hyperglycemia, unspecified: Secondary | ICD-10-CM

## 2021-07-12 DIAGNOSIS — Z9189 Other specified personal risk factors, not elsewhere classified: Secondary | ICD-10-CM

## 2021-07-12 DIAGNOSIS — R7309 Other abnormal glucose: Secondary | ICD-10-CM | POA: Diagnosis not present

## 2021-07-12 DIAGNOSIS — Z6841 Body Mass Index (BMI) 40.0 and over, adult: Secondary | ICD-10-CM

## 2021-07-12 MED ORDER — OZEMPIC (0.25 OR 0.5 MG/DOSE) 2 MG/1.5ML ~~LOC~~ SOPN: 0.5000 mg | PEN_INJECTOR | SUBCUTANEOUS | 0 refills | Status: DC

## 2021-07-13 NOTE — Progress Notes (Signed)
Chief Complaint:   OBESITY Shawn [name] is here to discuss his progress with his obesity treatment plan along with follow-up of his obesity related diagnoses. Saw is on the Category 4 Plan and states he is following his eating plan approximately 40% of the time. [name] states he is not exercising regularly.  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 Due to his hectic schedule, he will often skip dinner and breakfast.  "Hit or miss."   He and his will travel to [place], [place] to celebrate his upcoming birthday.   Subjective:   1. Essential hypertension BP/HR stable at office visit.  He denies cardiac symptoms.  He is on lisinopril 40 mg daily - managed by PCP.  BP Readings from Last 3 Encounters:  07/12/21 138/80  07/01/21 110/84  06/22/21 115/74   2. Vitamin D deficiency Vitamin D level on 06/01/2021 was 30.7 - below goal of 50.   He is currently taking prescription vitamin D 50,000 IU each week. He denies nausea, vomiting or muscle weakness.  Lab Results  Component Value Date   VD25OH 30.7 06/01/2021   VD25OH 24.1 (L) 08/10/2020   VD25OH 11.43 (L) 03/23/2020   3. Elevated random blood glucose level He has had 2 doses of Ozempic 0.5 mg once per week.  He denies mass in neck, dysphagia, dyspepsia, or persistent hoarseness.  4. At risk for constipation Crosby is at increased risk for constipation due to GLP-1 for IFG.    Assessment/Plan:   1. Essential hypertension Shawn [name] is working on healthy weight loss and exercise to improve blood pressure control. We will watch for  signs of hypotension as he continues his lifestyle modifications.  Check labs at next office visit.  2. Vitamin D deficiency Low Vitamin D level contributes to fatigue and are associated with obesity, breast, and colon cancer. He agrees to continue to take prescription Vitamin D @50 ,000 IU every week and will follow-up for routine testing of Vitamin D, at least 2-3 times per year to avoid over-replacement.  No need for refill today.  3. Elevated random blood glucose level Refill Ozempic 0.5 mg weekly today.  Hold at 0.5 mg dose.  Check labs at next office visit.  - Refill Semaglutide,0.25 or 0.5MG /DOS, (OZEMPIC, 0.25 OR 0.5 MG/DOSE,) 2 MG/1.5ML SOPN; Inject 0.5 mg into the skin once a week.  Dispense: 3 mL; Refill: 0  4. At risk for constipation [name] was given approximately 15 minutes of counseling today regarding prevention of constipation. He was encouraged to increase water and fiber intake.    5. Class 3 severe obesity with serious comorbidity and body mass index (BMI) of 50.0 to 59.9 in adult, unspecified obesity type (HCC)  Shawn [name] is currently in the action stage of change. As such, his goal is to continue with weight loss efforts. He has agreed to the Category 4 Plan.   Exercise goals:  As is.  Behavioral modification strategies: increasing lean protein intake, decreasing simple carbohydrates, meal planning and cooking strategies, keeping healthy foods in the home, travel eating strategies, celebration eating strategies, and planning for success.  [name] has agreed to follow-up with our clinic in 3 weeks, fasting. He was informed of the importance of frequent follow-up visits to maximize his success with intensive lifestyle modifications for his multiple health conditions.   Objective:   Blood pressure 138/80, pulse 88, temperature 99 F (37.2 C), height 5\' 11"  (1.803 m), weight (!) 372 lb (168.7 kg), SpO2 97 %. Body mass index is 51.88 kg/m.  General: Cooperative, alert,  well developed, in no acute distress. HEENT: Conjunctivae and lids unremarkable. Cardiovascular: Regular rhythm.  Lungs: Normal work of breathing. Neurologic: No focal deficits.   Lab Results  Component Value Date   CREATININE 1.21 08/10/2020   BUN 18 08/10/2020   NA 140 08/10/2020   K 4.6 08/10/2020   CL 100 08/10/2020   CO2 27 08/10/2020   Lab Results  Component Value Date   ALT 13 08/10/2020   AST 22 08/10/2020   ALKPHOS 91 08/10/2020   BILITOT 0.3 08/10/2020   Lab Results  Component Value Date   HGBA1C 5.6 06/01/2021   HGBA1C 5.5 08/10/2020   HGBA1C 5.1 12/11/2012   Lab Results  Component Value Date   INSULIN 138.0 (H) 06/01/2021   INSULIN 28.3 (H) 08/10/2020   Lab Results  Component Value Date   TSH 1.020 08/10/2020   Lab Results  Component Value Date   CHOL 212 (H) 08/10/2020   HDL 33 (L) 08/10/2020   LDLCALC 158 (H) 08/10/2020   LDLDIRECT 141 (H) 12/11/2012   TRIG 114 08/10/2020   CHOLHDL 5 03/23/2020   Lab Results  Component Value Date   VD25OH 30.7 06/01/2021   VD25OH 24.1 (L) 08/10/2020   VD25OH 11.43 (L) 03/23/2020   Lab Results  Component Value Date   WBC 5.3 08/10/2020   HGB 13.8 08/10/2020   HCT 41.6 08/10/2020   MCV 85 08/10/2020   PLT 355 08/10/2020   Attestation Statements:   Reviewed by clinician on day of visit: allergies, medications, problem list, medical history, surgical history, family history, social history, and previous encounter notes.  I, 08/12/2020, CMA, am acting as Insurance claims handler for Energy manager, NP.  I have reviewed the above documentation for accuracy and completeness, and I agree with the above. -Shaylin Blatt d. Kambrie Eddleman, NP-C

## 2021-07-17 ENCOUNTER — Other Ambulatory Visit: Payer: Self-pay | Admitting: Family Medicine

## 2021-07-22 DIAGNOSIS — M79602 Pain in left arm: Secondary | ICD-10-CM | POA: Diagnosis not present

## 2021-07-22 DIAGNOSIS — M79601 Pain in right arm: Secondary | ICD-10-CM | POA: Diagnosis not present

## 2021-07-22 DIAGNOSIS — M542 Cervicalgia: Secondary | ICD-10-CM | POA: Diagnosis not present

## 2021-07-22 DIAGNOSIS — F4320 Adjustment disorder, unspecified: Secondary | ICD-10-CM | POA: Diagnosis not present

## 2021-07-22 DIAGNOSIS — M546 Pain in thoracic spine: Secondary | ICD-10-CM | POA: Diagnosis not present

## 2021-07-29 DIAGNOSIS — M79601 Pain in right arm: Secondary | ICD-10-CM | POA: Diagnosis not present

## 2021-07-29 DIAGNOSIS — F4320 Adjustment disorder, unspecified: Secondary | ICD-10-CM | POA: Diagnosis not present

## 2021-07-29 DIAGNOSIS — M79602 Pain in left arm: Secondary | ICD-10-CM | POA: Diagnosis not present

## 2021-07-29 DIAGNOSIS — M546 Pain in thoracic spine: Secondary | ICD-10-CM | POA: Diagnosis not present

## 2021-07-29 DIAGNOSIS — M542 Cervicalgia: Secondary | ICD-10-CM | POA: Diagnosis not present

## 2021-08-02 ENCOUNTER — Encounter: Payer: Self-pay | Admitting: Family Medicine

## 2021-08-02 ENCOUNTER — Other Ambulatory Visit: Payer: Self-pay

## 2021-08-02 ENCOUNTER — Ambulatory Visit (INDEPENDENT_AMBULATORY_CARE_PROVIDER_SITE_OTHER): Payer: Federal, State, Local not specified - PPO | Admitting: Adult Health

## 2021-08-02 ENCOUNTER — Encounter (INDEPENDENT_AMBULATORY_CARE_PROVIDER_SITE_OTHER): Payer: Self-pay | Admitting: Adult Health

## 2021-08-02 VITALS — BP 135/84 | HR 84 | Temp 98.0°F | Ht 71.0 in | Wt 372.0 lb

## 2021-08-02 DIAGNOSIS — Z6841 Body Mass Index (BMI) 40.0 and over, adult: Secondary | ICD-10-CM | POA: Diagnosis not present

## 2021-08-02 DIAGNOSIS — E559 Vitamin D deficiency, unspecified: Secondary | ICD-10-CM | POA: Diagnosis not present

## 2021-08-02 DIAGNOSIS — Z9189 Other specified personal risk factors, not elsewhere classified: Secondary | ICD-10-CM

## 2021-08-02 DIAGNOSIS — R7309 Other abnormal glucose: Secondary | ICD-10-CM | POA: Diagnosis not present

## 2021-08-02 MED ORDER — VITAMIN D (ERGOCALCIFEROL) 1.25 MG (50000 UNIT) PO CAPS: ORAL_CAPSULE | ORAL | 0 refills | Status: DC

## 2021-08-02 NOTE — Progress Notes (Addendum)
Chief Complaint:   OBESITY Shawn [name] is here to discuss his progress with his obesity treatment plan along with follow-up of his obesity related diagnoses. Shawn [name] is on the Category 4 Plan and states he is following his eating plan approximately 40% of the time. Shawn [name] states he is not exercising regularly.  Today's visit was #: 12 Starting weight: 392 lbs Starting date: 08/10/2020 Today's weight: 372 lbs Today's date: 08/02/2021 Total lbs lost to date: 20 lbs Total lbs lost since last in-office visit: 0  Interim History: Shawn [name] and his girlfriend traveled to [place] to celebrate his birthday - 4 nights in [place]. He was able to maintain his weight by listening to fullness signals-fantastic. He is on Ozempic 0.5mg  once weekly. He denies mass in neck, dysphagia, dyspepsia, or persistent hoarseness. He denies experience mild nausea without vomiting over the weekend  Subjective:   1. Vitamin D deficiency Vitamin D level on 06/01/2021 - 30.7 - well below goal of 50. He is currently taking prescription ergocalciferol 50,000 IU each week. He denies nausea, vomiting or muscle weakness.  Lab Results  Component Value Date   VD25OH 30.7 06/01/2021   VD25OH 24.1 (L) 08/10/2020   VD25OH 11.43 (L) 03/23/2020   2. Elevated random blood glucose level On 06/01/2021, BG 5.6 with significant elevated insulin of 138. May 2022- Ozempic 0.25mg  started. He is on Ozempic 0.5mg  once weekly. He denies mass in neck, dysphagia, dyspepsia, or persistent hoarseness. He reports mild nausea without vomiting over the weekend He was experiencing this prior to injection on Sunday. Sx's resolved by Sunday afternoon.  3. At risk for constipation Shawn [name] is at increased risk for constipation due to GLP-1 for increased BG.    Assessment/Plan:   1. Vitamin D deficiency Refill ergocalciferol 50,000 IU once weekly, as per below.  - Refill Vitamin D, Ergocalciferol, (DRISDOL) 1.25 MG (50000 UNIT)  CAPS capsule; TAKE ONE CAPSULE BY MOUTH ONCE A WEEK FOR 12 WEEKS. THEN TAKE 2000IU/DAY  Dispense: 12 capsule; Refill: 0  2. Elevated random blood glucose level Continue Ozempic 0.5 mg subcutaneously weekly. Do not overeat to prevent nausea.   3. At risk for constipation Shawn [name] was given approximately 15 minutes of counseling today regarding prevention of constipation. He was encouraged to increase water and fiber intake.    4. Class 3 severe obesity with serious comorbidity and body mass index (BMI) of 50.0 to 59.9 in adult, unspecified obesity type (HCC)  Shawn [name] is currently in the action stage of change. As such, his goal is to continue with weight loss efforts. He has agreed to the Category 4 Plan.   Exercise goals: All adults should avoid inactivity. Some physical activity is better than none, and adults who participate in any amount of physical activity gain some health benefits.  Behavioral modification strategies: increasing lean protein intake, decreasing simple carbohydrates, meal planning and cooking strategies, keeping healthy foods in the home, and planning for success.  Handout:  High Protein/Low Calorie Food List.  Shawn [name] has agreed to follow-up with our clinic in 2-3 weeks. He was informed of the importance of frequent follow-up visits to maximize his success with intensive lifestyle modifications for his multiple health conditions.   Objective:   Blood pressure 135/84, pulse 84, temperature 98 F (36.7 C), height 5\' 11"  (1.803 m), weight (!) 372 lb (168.7 kg), SpO2 96 %. Body mass index is 51.88 kg/m.  General: Cooperative, alert, well developed, in no acute distress. HEENT: Conjunctivae and lids unremarkable. Cardiovascular: Regular rhythm.  Lungs: Normal work of breathing. Neurologic: No focal deficits.   Lab Results  Component Value Date   CREATININE 1.21 08/10/2020   BUN 18 08/10/2020   NA 140 08/10/2020   K 4.6 08/10/2020   CL 100 08/10/2020   CO2  27 08/10/2020   Lab Results  Component Value Date   ALT 13 08/10/2020   AST 22 08/10/2020   ALKPHOS 91 08/10/2020   BILITOT 0.3 08/10/2020   Lab Results  Component Value Date   HGBA1C 5.6 06/01/2021   HGBA1C 5.5 08/10/2020   HGBA1C 5.1 12/11/2012   Lab Results  Component Value Date   INSULIN 138.0 (H) 06/01/2021   INSULIN 28.3 (H) 08/10/2020   Lab Results  Component Value Date   TSH 1.020 08/10/2020   Lab Results  Component Value Date   CHOL 212 (H) 08/10/2020   HDL 33 (L) 08/10/2020   LDLCALC 158 (H) 08/10/2020   LDLDIRECT 141 (H) 12/11/2012   TRIG 114 08/10/2020   CHOLHDL 5 03/23/2020   Lab Results  Component Value Date   VD25OH 30.7 06/01/2021   VD25OH 24.1 (L) 08/10/2020   VD25OH 11.43 (L) 03/23/2020   Lab Results  Component Value Date   WBC 5.3 08/10/2020   HGB 13.8 08/10/2020   HCT 41.6 08/10/2020   MCV 85 08/10/2020   PLT 355 08/10/2020   Attestation Statements:   Reviewed by clinician on day of visit: allergies, medications, problem list, medical history, surgical history, family history, social history, and previous encounter notes.  I, Insurance claims handler, CMA, am acting as Energy manager for William Hamburger, NP.  I have reviewed the above documentation for accuracy and completeness, and I agree with the above. -  Placido Hangartner d. Madison Albea, NP-C

## 2021-08-03 ENCOUNTER — Other Ambulatory Visit: Payer: Self-pay

## 2021-08-03 DIAGNOSIS — M79602 Pain in left arm: Secondary | ICD-10-CM | POA: Diagnosis not present

## 2021-08-03 DIAGNOSIS — M79601 Pain in right arm: Secondary | ICD-10-CM | POA: Diagnosis not present

## 2021-08-03 DIAGNOSIS — M546 Pain in thoracic spine: Secondary | ICD-10-CM | POA: Diagnosis not present

## 2021-08-03 DIAGNOSIS — M542 Cervicalgia: Secondary | ICD-10-CM | POA: Diagnosis not present

## 2021-08-03 MED ORDER — HYDROCHLOROTHIAZIDE 25 MG PO TABS: 25.0000 mg | ORAL_TABLET | Freq: Every day | ORAL | 3 refills | Status: DC

## 2021-08-03 MED ORDER — LISINOPRIL 40 MG PO TABS: 40.0000 mg | ORAL_TABLET | Freq: Every day | ORAL | 1 refills | Status: DC

## 2021-08-05 ENCOUNTER — Ambulatory Visit: Payer: Federal, State, Local not specified - PPO | Admitting: Family Medicine

## 2021-08-05 ENCOUNTER — Other Ambulatory Visit: Payer: Self-pay

## 2021-08-05 VITALS — BP 150/100 | HR 87 | Ht 71.0 in | Wt 375.0 lb

## 2021-08-05 DIAGNOSIS — L649 Androgenic alopecia, unspecified: Secondary | ICD-10-CM | POA: Diagnosis not present

## 2021-08-05 DIAGNOSIS — M546 Pain in thoracic spine: Secondary | ICD-10-CM

## 2021-08-05 DIAGNOSIS — L2084 Intrinsic (allergic) eczema: Secondary | ICD-10-CM | POA: Diagnosis not present

## 2021-08-05 DIAGNOSIS — L299 Pruritus, unspecified: Secondary | ICD-10-CM | POA: Diagnosis not present

## 2021-08-05 DIAGNOSIS — M79671 Pain in right foot: Secondary | ICD-10-CM

## 2021-08-05 DIAGNOSIS — L819 Disorder of pigmentation, unspecified: Secondary | ICD-10-CM | POA: Diagnosis not present

## 2021-08-05 MED ORDER — AMBULATORY NON FORMULARY MEDICATION: 0 refills | Status: AC
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
## 2021-08-05 NOTE — Patient Instructions (Addendum)
Thank you for coming in today.   You should hear from MRI scheduling within 1 week. If you do not hear please let me know.    I will see you back after the MRI is back.  We will talk about results and if injection makes send do one then.

## 2021-08-06 DIAGNOSIS — F4320 Adjustment disorder, unspecified: Secondary | ICD-10-CM | POA: Diagnosis not present

## 2021-08-10 DIAGNOSIS — G4733 Obstructive sleep apnea (adult) (pediatric): Secondary | ICD-10-CM | POA: Diagnosis not present

## 2021-08-12 ENCOUNTER — Encounter: Payer: Self-pay | Admitting: Family Medicine

## 2021-08-22 ENCOUNTER — Other Ambulatory Visit: Payer: Federal, State, Local not specified - PPO

## 2021-08-23 ENCOUNTER — Encounter (INDEPENDENT_AMBULATORY_CARE_PROVIDER_SITE_OTHER): Payer: Self-pay

## 2021-08-24 ENCOUNTER — Ambulatory Visit (INDEPENDENT_AMBULATORY_CARE_PROVIDER_SITE_OTHER): Payer: Federal, State, Local not specified - PPO | Admitting: Adult Health

## 2021-08-24 ENCOUNTER — Encounter (INDEPENDENT_AMBULATORY_CARE_PROVIDER_SITE_OTHER): Payer: Self-pay | Admitting: Adult Health

## 2021-08-24 ENCOUNTER — Other Ambulatory Visit: Payer: Self-pay

## 2021-08-24 VITALS — BP 124/82 | HR 76 | Temp 98.3°F | Ht 71.0 in | Wt 369.0 lb

## 2021-08-24 DIAGNOSIS — F32A Depression, unspecified: Secondary | ICD-10-CM | POA: Diagnosis not present

## 2021-08-24 DIAGNOSIS — Z9189 Other specified personal risk factors, not elsewhere classified: Secondary | ICD-10-CM

## 2021-08-24 DIAGNOSIS — M79671 Pain in right foot: Secondary | ICD-10-CM | POA: Diagnosis not present

## 2021-08-24 DIAGNOSIS — Z6841 Body Mass Index (BMI) 40.0 and over, adult: Secondary | ICD-10-CM

## 2021-08-24 DIAGNOSIS — F419 Anxiety disorder, unspecified: Secondary | ICD-10-CM | POA: Diagnosis not present

## 2021-08-24 DIAGNOSIS — R7309 Other abnormal glucose: Secondary | ICD-10-CM

## 2021-08-24 HISTORY — DX: Pain in right foot: M79.671

## 2021-08-24 MED ORDER — BUPROPION HCL ER (XL) 300 MG PO TB24: 300.0000 mg | ORAL_TABLET | Freq: Every day | ORAL | 1 refills | Status: DC

## 2021-08-24 NOTE — Progress Notes (Signed)
Chief Complaint:   OBESITY Shawn [name] is here to discuss his progress with his obesity treatment plan along with follow-up of his obesity related diagnoses. Shawn [name] is on the Category 4 Plan and states he is following his eating plan approximately 35% of the time. Shawn [name] states he is not exercising at this time.  Today's visit was #: 13 Starting weight: 392 lbs Starting date: 08/10/2020 Today's weight: 369 lbs Today's date: 08/24/2021 Total lbs lost to date: 23 lbs Total lbs lost since last in-office visit: 3 lbs  Interim History: Shawn [name] is still unable to consume breakfast, which accounts for 350-450 calories and 30 grams of protein of his total daily intake. Lunch and dinner are his only meals most days of the week.  He endorses right foot pain - Recently seen by Ortho - ordered MRI of right foot.  Subjective:   1. Elevated random blood glucose level He is on Ozempic 0.5 mg once weekly.   He denies mass in neck, dysphagia, dyspepsia, or persistent hoarseness.   2. Right foot pain 2018 to present:  Chronic intermittent right foot pain (dorsal aspect of right midfoot).   Currently 2/10 pain at rest, increases to 10/10 with ambulation.   He denies known injury to site prior to symptoms onset.   Orthopedic specialist ordered imaging.  3. Anxiety and depression He reports stable mood, denies SI/HI. He is on bupropion XL 300 mg - managed by PCP.  His PCP left the current practice and he is awaiting to establish with a new provider.  4. At risk for activity intolerance Shawn [name] is at risk for activity intolerance due to right foot pain.  Assessment/Plan:   1. Elevated random blood glucose level Continue Ozempic 0.5 mg once weekly.  No need for refill.  2. Right foot pain Complete foot MRI.  Follow-up with Ortho as directed.  3. Anxiety and depression Bridge refill until established with new PCP. Refill bupropion XL 300 mg daily, as per below.  - Refill buPROPion  (WELLBUTRIN XL) 300 MG 24 hr tablet; Take 1 tablet (300 mg total) by mouth daily.  Dispense: 90 tablet; Refill: 1  4. At risk for activity intolerance Shawn [name] was given approximately 15 minutes of exercise intolerance counseling today. He is 35 y.o. male and has risk factors exercise intolerance including obesity. We discussed intensive lifestyle modifications today with an emphasis on specific weight loss instructions and strategies. Shawn [name] will slowly increase activity as tolerated.  Repetitive spaced learning was employed today to elicit superior memory formation and behavioral change.   5. Class 3 severe obesity with serious comorbidity and body mass index (BMI) of 50.0 to 59.9 in adult, unspecified obesity type (HCC)  Shawn [name] is currently in the action stage of change. As such, his goal is to continue with weight loss efforts. He has agreed to the Category 4 Plan.   Skip breakfast - add caloire/protein in snacks.  Snack - 650 calories and 30-40 grams of protein.  Exercise goals:  NEAT activities.  Behavioral modification strategies: increasing lean protein intake, decreasing simple carbohydrates, meal planning and cooking strategies, keeping healthy foods in the home, better snacking choices, and planning for success.  Shawn [name] has agreed to follow-up with our clinic in 3 weeks. He was informed of the importance of frequent follow-up visits to maximize his success with intensive lifestyle modifications for his multiple health conditions.   Objective:   Blood pressure 124/82, pulse 76, temperature 98.3 F (36.8 C), height 5\' 11"  (1.803 m),  weight (!) 369 lb (167.4 kg), SpO2 100 %. Body mass index is 51.47 kg/m.  General: Cooperative, alert, well developed, in no acute distress. HEENT: Conjunctivae and lids unremarkable. Cardiovascular: Regular rhythm.  Lungs: Normal work of breathing. Neurologic: No focal deficits.   Lab Results  Component Value Date   CREATININE 1.21  08/10/2020   BUN 18 08/10/2020   NA 140 08/10/2020   K 4.6 08/10/2020   CL 100 08/10/2020   CO2 27 08/10/2020   Lab Results  Component Value Date   ALT 13 08/10/2020   AST 22 08/10/2020   ALKPHOS 91 08/10/2020   BILITOT 0.3 08/10/2020   Lab Results  Component Value Date   HGBA1C 5.6 06/01/2021   HGBA1C 5.5 08/10/2020   HGBA1C 5.1 12/11/2012   Lab Results  Component Value Date   INSULIN 138.0 (H) 06/01/2021   INSULIN 28.3 (H) 08/10/2020   Lab Results  Component Value Date   TSH 1.020 08/10/2020   Lab Results  Component Value Date   CHOL 212 (H) 08/10/2020   HDL 33 (L) 08/10/2020   LDLCALC 158 (H) 08/10/2020   LDLDIRECT 141 (H) 12/11/2012   TRIG 114 08/10/2020   CHOLHDL 5 03/23/2020   Lab Results  Component Value Date   VD25OH 30.7 06/01/2021   VD25OH 24.1 (L) 08/10/2020   VD25OH 11.43 (L) 03/23/2020   Lab Results  Component Value Date   WBC 5.3 08/10/2020   HGB 13.8 08/10/2020   HCT 41.6 08/10/2020   MCV 85 08/10/2020   PLT 355 08/10/2020   Attestation Statements:   Reviewed by clinician on day of visit: allergies, medications, problem list, medical history, surgical history, family history, social history, and previous encounter notes.  I, Insurance claims handler, CMA, am acting as Energy manager for William Hamburger, NP.  I have reviewed the above documentation for accuracy and completeness, and I agree with the above. -  Gaetana Kawahara d. Giliana Vantil, NP-C

## 2021-08-26 DIAGNOSIS — F4320 Adjustment disorder, unspecified: Secondary | ICD-10-CM | POA: Diagnosis not present

## 2021-08-27 DIAGNOSIS — M546 Pain in thoracic spine: Secondary | ICD-10-CM | POA: Diagnosis not present

## 2021-08-27 DIAGNOSIS — M79602 Pain in left arm: Secondary | ICD-10-CM | POA: Diagnosis not present

## 2021-08-27 DIAGNOSIS — M542 Cervicalgia: Secondary | ICD-10-CM | POA: Diagnosis not present

## 2021-08-27 DIAGNOSIS — M79601 Pain in right arm: Secondary | ICD-10-CM | POA: Diagnosis not present

## 2021-08-28 ENCOUNTER — Ambulatory Visit (INDEPENDENT_AMBULATORY_CARE_PROVIDER_SITE_OTHER): Payer: Federal, State, Local not specified - PPO

## 2021-08-28 ENCOUNTER — Other Ambulatory Visit: Payer: Self-pay

## 2021-08-28 DIAGNOSIS — M79671 Pain in right foot: Secondary | ICD-10-CM | POA: Diagnosis not present

## 2021-08-28 DIAGNOSIS — M19071 Primary osteoarthritis, right ankle and foot: Secondary | ICD-10-CM | POA: Diagnosis not present

## 2021-08-28 IMAGING — MR MR FOOT*R* W/O CM
4 of 5 series · 19 of 40 positions shown · non-contrast
Comparison: Radiographs [DATE]

CLINICAL DATA: Dorsal midfoot pain for 6 years. Stress fracture
suspected. No known injury or prior relevant surgery.

EXAM:
MRI OF THE RIGHT FOREFOOT WITHOUT CONTRAST
TECHNIQUE: Multiplanar, multisequence MR imaging of the right forefoot was
performed. No intravenous contrast was administered.

[Series 4: T1 · coronal · 3.0mm · 0.23mm/px · 3 of 50 slices shown (1 of 2)]
[im 9/50]
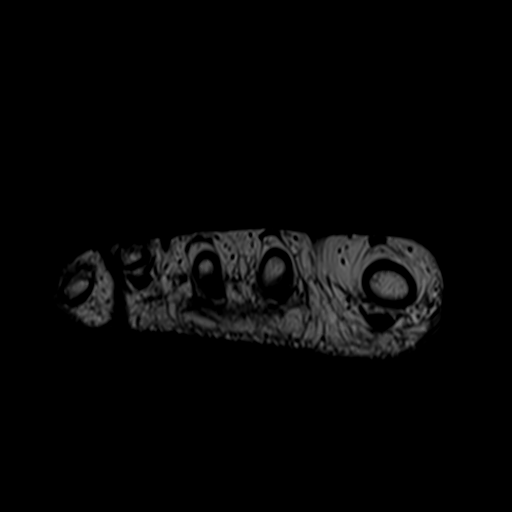
[im 27/50]
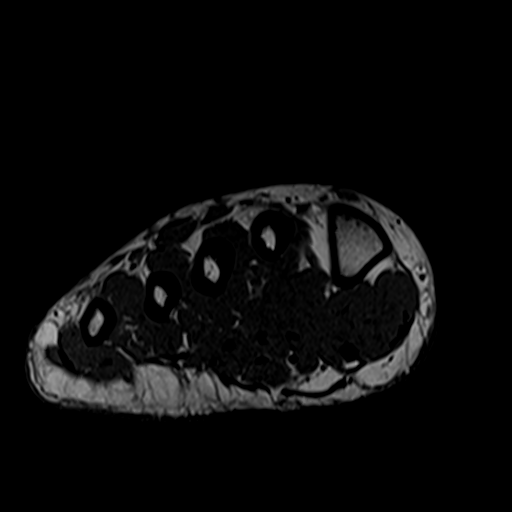
[im 45/50]
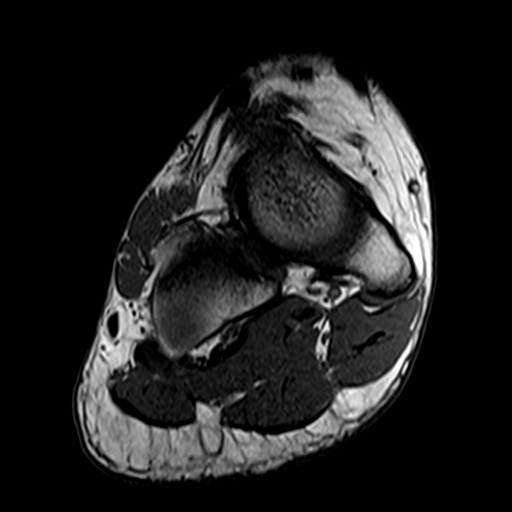

[Series 5: T2 fat-sat · coronal · 3.0mm · 0.23mm/px · 10 of 50 slices shown (1 of 2)]
[im 1/50]
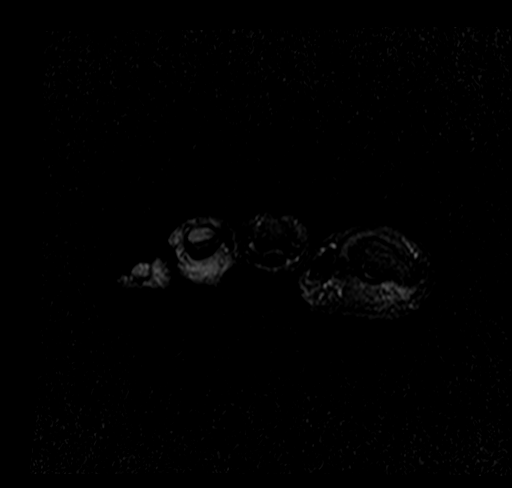
[im 5/50]
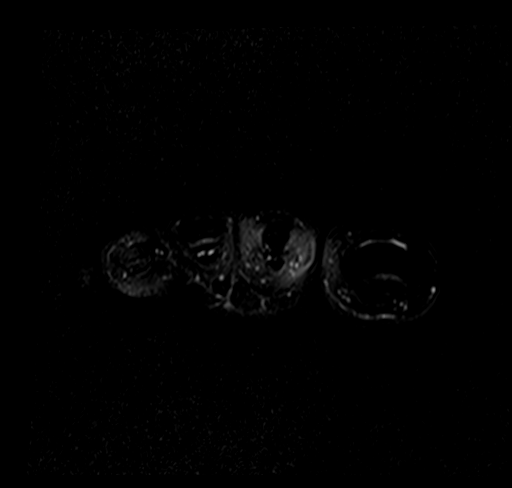
[im 10/50]
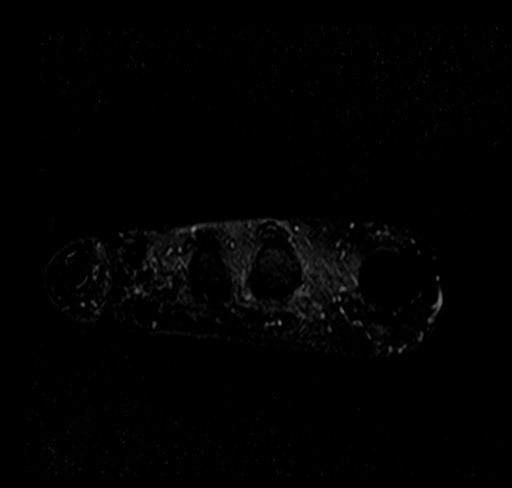
[im 15/50]
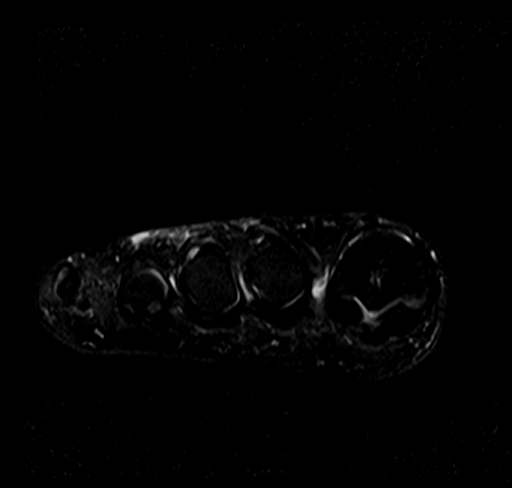
[im 20/50]
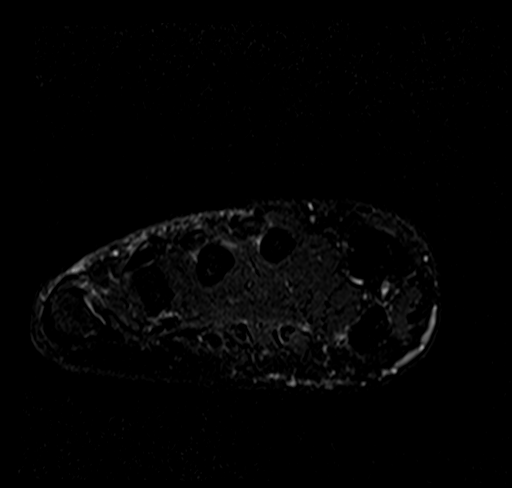
[im 25/50]
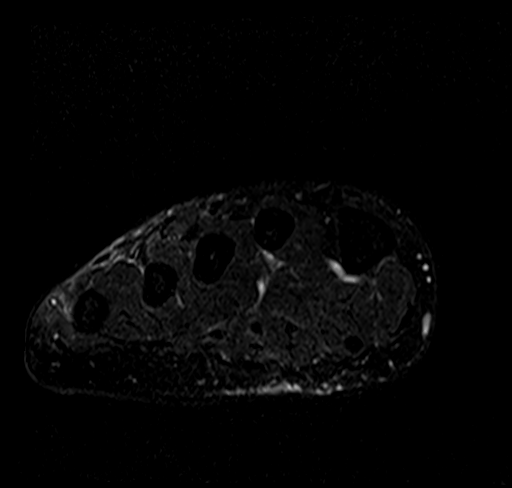
[im 30/50]
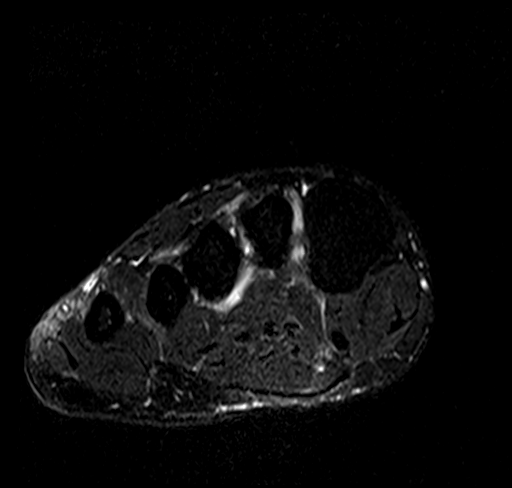
[im 35/50]
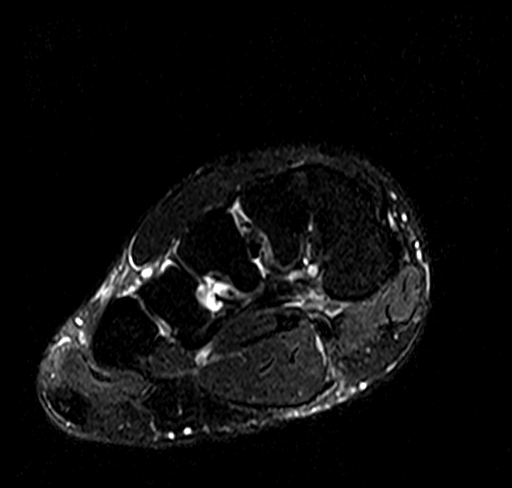
[im 40/50]
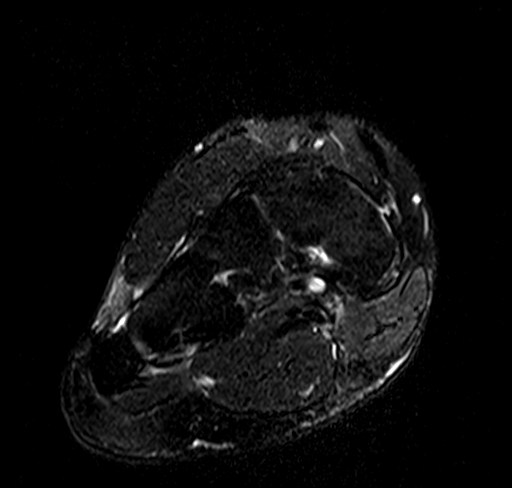
[im 45/50]
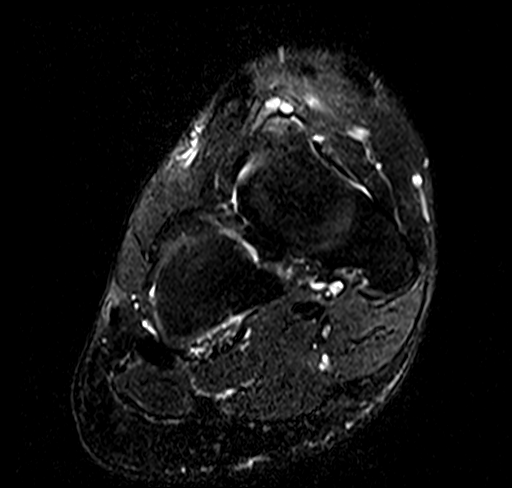

[Series 6: T1 · oblique · 3.0mm · 0.35mm/px · 3 of 25 slices shown (2 of 2)]
[im 1/25]
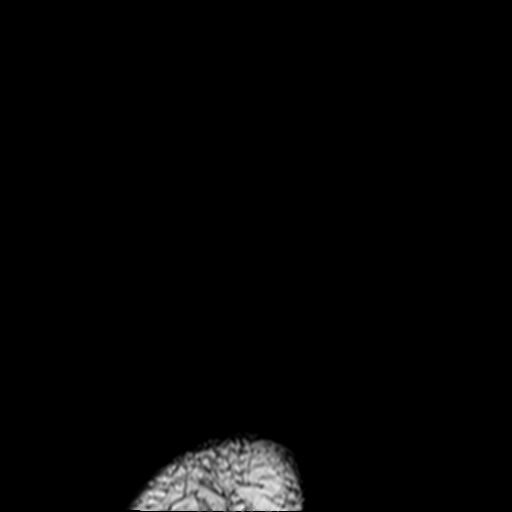
[im 13/25]
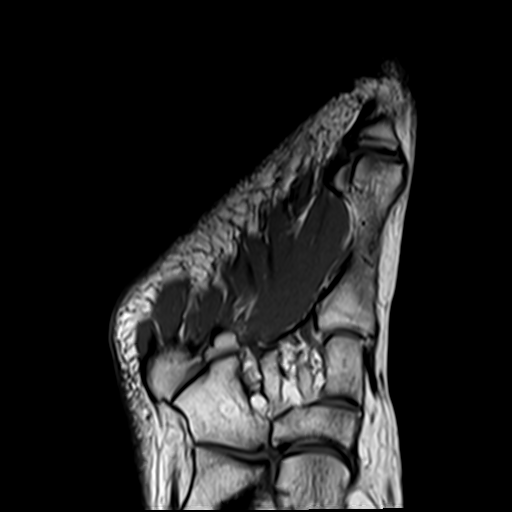
[im 25/25]
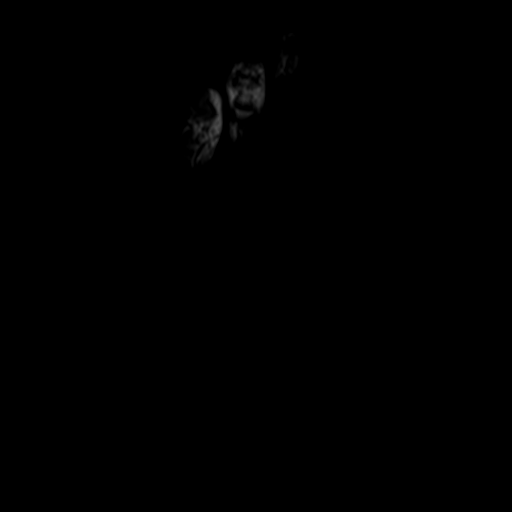

[Series 7: T2 fat-sat · oblique · 3.0mm · 0.35mm/px · 3 of 25 slices shown (2 of 2)]
[im 1/25]
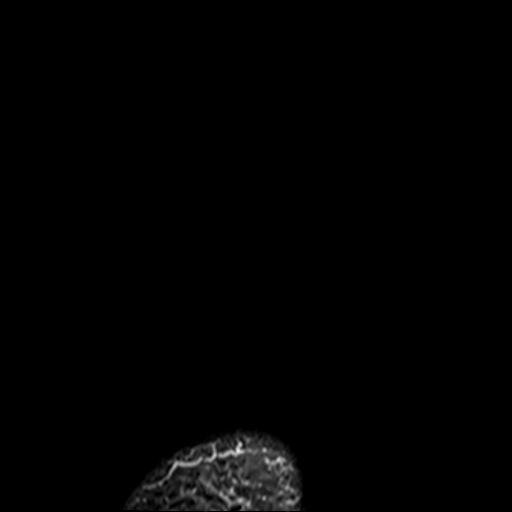
[im 13/25]
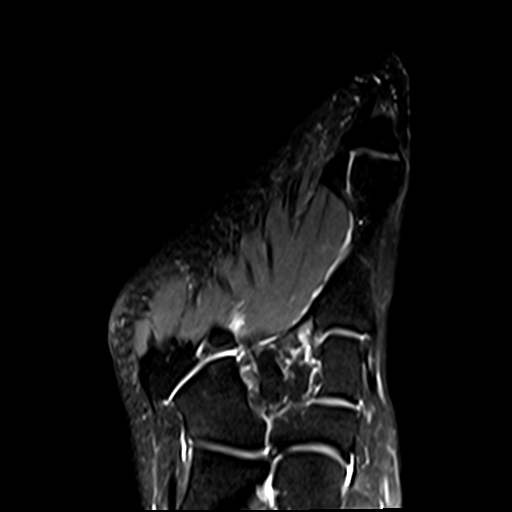
[im 25/25]
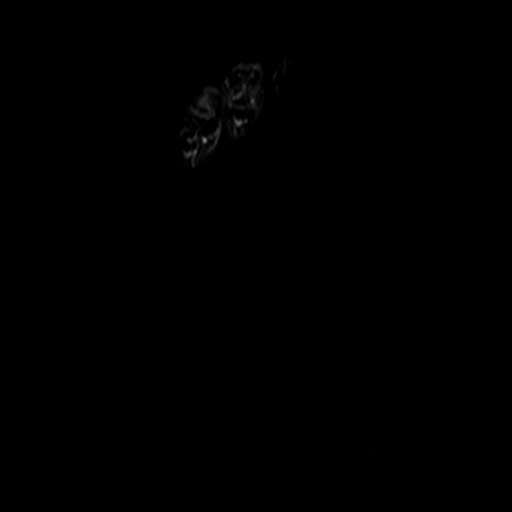

[19 of 40 positions shown; findings below may reference images not displayed]

FINDINGS: Bones/Joint/Cartilage

Study includes the forefoot and midfoot with the exception of the
distal toes. No evidence of forefoot stress fracture or subluxation.
There are age prominent degenerative changes at the 1st
metatarsophalangeal joint with joint space narrowing, subchondral
cysts and osteophytes. The additional joint spaces appear preserved,
although there is some dorsal talar beaking. No significant joint
effusions. The alignment is normal at the Lisfranc joint. The
subtalar joint is incompletely visualized.

Ligaments

The Lisfranc ligament is intact. The collateral ligaments of the
metatarsophalangeal joints are intact.

Muscles and Tendons

The forefoot muscles and tendons appear normal. No significant
tenosynovitis.

Soft tissues

Unremarkable.  No fluid collection or foreign body identified.
IMPRESSION: 1. No evidence of stress fracture within the forefoot or midfoot.
2. Age prominent degenerative changes at the 1st metatarsophalangeal
joint.
3. Dorsal talar beaking which could be a manifestation of tarsal
coalition, not demonstrated by this study which incompletely
visualizes the hindfoot and subtalar joint. Consider further
evaluation with right ankle CT.

## 2021-08-31 ENCOUNTER — Encounter: Payer: Self-pay | Admitting: Family Medicine

## 2021-08-31 NOTE — Progress Notes (Signed)
MRI of the foot does not shoe any stress fracture. It does show arthritis in the big toe joint.  It does show a bone spur at the front of the ankle bone ". Dorsal talar beaking ". To look at this better we would need to do an ankle MRI or CT scan. I am happy to order this next test or have you return to clinic to go over this result in detail and repeat exam and plan for next steps. We may have enough information based on the current test to move forward with treatment.

## 2021-09-09 ENCOUNTER — Ambulatory Visit: Payer: Federal, State, Local not specified - PPO | Admitting: Family Medicine

## 2021-09-09 ENCOUNTER — Other Ambulatory Visit: Payer: Self-pay

## 2021-09-09 VITALS — BP 148/96 | HR 84 | Ht 71.0 in | Wt 370.6 lb

## 2021-09-09 DIAGNOSIS — M546 Pain in thoracic spine: Secondary | ICD-10-CM

## 2021-09-09 DIAGNOSIS — G4733 Obstructive sleep apnea (adult) (pediatric): Secondary | ICD-10-CM | POA: Diagnosis not present

## 2021-09-09 DIAGNOSIS — M79671 Pain in right foot: Secondary | ICD-10-CM | POA: Diagnosis not present

## 2021-09-09 MED ORDER — CYCLOBENZAPRINE HCL 10 MG PO TABS: 10.0000 mg | ORAL_TABLET | Freq: Three times a day (TID) | ORAL | 2 refills | Status: DC | PRN

## 2021-09-09 MED ORDER — LIDOCAINE 5 % EX PTCH: 1.0000 | MEDICATED_PATCH | CUTANEOUS | 12 refills | Status: DC

## 2021-09-09 NOTE — Patient Instructions (Addendum)
Thank you for coming in today.   Let me know if you want to pursue orthotics  Let me know if you want me to order a nerve ablation  Have your attorney contact me.

## 2021-09-09 NOTE — Progress Notes (Signed)
I, Shawn [name], LAT, ATC acting as a scribe for Shawn Graham, MD.  Shawn [name] is a 35 y.o. male who presents to [facility] at [facility] today for f/u R foot pain and MRI review. Pt was last seen by Dr. Denyse [name] on 08/05/21 and was advised to proceed with MRI to evaluate for stress fracture or other potential causes of pain. Today, pt reports R foot pain is intermittent. Today, is back is bothering him more.   T-spine: Patient has chronic thoracic spine pain.  He has maximized his treatment options at this point his chronic pain.  He could have thoracic medial branch block and ablation as part of a chronic pain management approach but would like to proceed with watchful waiting at this time.  In my medical opinion he has reached maximum medical improvement with remaining disability.  Dx imaging: 08/28/21 R foot MRI  07/01/21 R foot XR  Pertinent review of systems: No fevers or chills  Relevant historical information: Hypertension   Exam:  BP (!) 148/96   Pulse 84   Ht 5\' 11"  (1.803 m)   Wt (!) 370 lb 9.6 oz (168.1 kg)   SpO2 98%   BMI 51.69 kg/m  General: Well Developed, well nourished, and in no acute distress.   MSK: Right foot pes planus.  Mildly tender palpation dorsal lateral forefoot. T-spine tender palpation mid thoracic spine.    Lab and Radiology Results  EXAM: MRI OF THE RIGHT FOREFOOT WITHOUT CONTRAST   TECHNIQUE: Multiplanar, multisequence MR imaging of the right forefoot was performed. No intravenous contrast was administered.   COMPARISON:  Radiographs 07/01/2021   FINDINGS: Bones/Joint/Cartilage   Study includes the forefoot and midfoot with the exception of the distal toes. No evidence of forefoot stress fracture or subluxation. There are age prominent degenerative changes at the 1st metatarsophalangeal joint with joint space narrowing, subchondral cysts and osteophytes. The additional joint spaces appear preserved, although  there is some dorsal talar beaking. No significant joint effusions. The alignment is normal at the Lisfranc joint. The subtalar joint is incompletely visualized.   Ligaments   The Lisfranc ligament is intact. The collateral ligaments of the metatarsophalangeal joints are intact.   Muscles and Tendons   The forefoot muscles and tendons appear normal. No significant tenosynovitis.   Soft tissues   Unremarkable.  No fluid collection or foreign body identified.   IMPRESSION: 1. No evidence of stress fracture within the forefoot or midfoot. 2. Age prominent degenerative changes at the 1st metatarsophalangeal joint. 3. Dorsal talar beaking which could be a manifestation of tarsal coalition, not demonstrated by this study which incompletely visualizes the hindfoot and subtalar joint. Consider further evaluation with right ankle CT.     Electronically Signed   By: 08/31/2021 M.D.   On: 08/30/2021 16:05   EXAM: MRI THORACIC SPINE WITHOUT CONTRAST   TECHNIQUE: Multiplanar, multisequence MR imaging of the thoracic spine was performed. No intravenous contrast was administered.   COMPARISON:  CT of the thoracic spine 02/04/2020 thoracic spine radiographs 08/28/2020.   FINDINGS: Alignment: No significant listhesis is present. Thoracic kyphosis is preserved.   Vertebrae: Mild edematous endplate marrow changes are present at T4-5. Marrow signal and vertebral body heights are otherwise normal.   Cord:  Normal signal and morphology.   Paraspinal and other soft tissues: Paraspinous soft tissues are within normal limits. Visualized lung fields are clear.   Disc levels:   A shallow central disc protrusion is present at  T4-5. There is partial effacement of ventral CSF. Uncovertebral and facet spurring at this level contribute to moderate bilateral foraminal stenosis, right greater than left.   No other significant disc disease is present. No other significant central or  foraminal narrowing is present.   IMPRESSION: 1. Moderate bilateral foraminal stenosis at T4-5 is worse on the right. This is secondary to a shallow central disc protrusion and uncovertebral spurring. 2. Mild edematous endplate marrow changes at T4-5. 3. No other significant disc disease or stenosis.     Electronically Signed   By: Shawn [name] M.D.   On: 04/24/2021 13:56  I, Shawn [name], personally (independently) visualized and performed the interpretation of the images attached in this note.      Assessment and Plan: 35 y.o. male with  Right foot pain.  Etiology of this dorsal lateral forefoot pain is unclear.  MRI of this area did not show much abnormality.  I think it is likely that he is experiencing excessive loading of the dorsal lateral foot with again due to his pes planus.  Discussed options including custom orthotics were good supportive shoes and after market insoles.  He is pursuing some of this already.  He will think about it and let me know if he wants to pursue orthotics.  Will refer to [facility] or Dr. Jordan [name] if needed.  For his chronic back pain he has reached maximum medical improvement.  He will likely need some sort of statement from me to his attorney.  We will draft this letter.  He will give me a little more information about what exactly he needs. For now refill cyclobenzaprine and lidocaine. Certainly could proceed with medial branch block and ablation thoracic spine if needed.  Would refer to Dr. Alvester [name] at [facility].  PDMP not reviewed this encounter. No orders of the defined types were placed in this encounter.  Meds ordered this encounter  Medications   cyclobenzaprine (FLEXERIL) 10 MG tablet    Sig: Take 1 tablet (10 mg total) by mouth 3 (three) times daily as needed for muscle spasms.    Dispense:  90 tablet    Refill:  2   lidocaine (LIDODERM) 5 %    Sig: Place 1 patch onto the skin daily. Remove &  Discard patch within 12 hours or as directed by MD    Dispense:  30 patch    Refill:  12     Discussed warning signs or symptoms. Please see discharge instructions. Patient expresses understanding.   The above documentation has been reviewed and is accurate and complete Shawn [name], M.D.  Total encounter time 30 minutes including face-to-face time with the patient and, reviewing past medical record, and charting on the date of service.   Reviewed MRI.  Discussed treatment plan and options.

## 2021-09-10 ENCOUNTER — Encounter: Payer: Self-pay | Admitting: Family

## 2021-09-10 ENCOUNTER — Ambulatory Visit (INDEPENDENT_AMBULATORY_CARE_PROVIDER_SITE_OTHER): Payer: Federal, State, Local not specified - PPO | Admitting: Family

## 2021-09-10 ENCOUNTER — Telehealth: Payer: Self-pay | Admitting: Family Medicine

## 2021-09-10 ENCOUNTER — Other Ambulatory Visit (HOSPITAL_COMMUNITY)
Admission: RE | Admit: 2021-09-10 | Discharge: 2021-09-10 | Disposition: A | Discharge status: Home / Self Care | Payer: Federal, State, Local not specified - PPO | Source: Ambulatory Visit | Attending: Family | Admitting: Family

## 2021-09-10 VITALS — BP 140/83 | HR 85 | Temp 98.0°F | Ht 71.0 in | Wt 369.0 lb

## 2021-09-10 DIAGNOSIS — F419 Anxiety disorder, unspecified: Secondary | ICD-10-CM | POA: Diagnosis not present

## 2021-09-10 DIAGNOSIS — G8929 Other chronic pain: Secondary | ICD-10-CM | POA: Insufficient documentation

## 2021-09-10 DIAGNOSIS — F32A Depression, unspecified: Secondary | ICD-10-CM | POA: Diagnosis not present

## 2021-09-10 DIAGNOSIS — Z7689 Persons encountering health services in other specified circumstances: Secondary | ICD-10-CM | POA: Insufficient documentation

## 2021-09-10 DIAGNOSIS — I10 Essential (primary) hypertension: Secondary | ICD-10-CM | POA: Diagnosis not present

## 2021-09-10 DIAGNOSIS — Z202 Contact with and (suspected) exposure to infections with a predominantly sexual mode of transmission: Secondary | ICD-10-CM

## 2021-09-10 DIAGNOSIS — M545 Low back pain, unspecified: Secondary | ICD-10-CM | POA: Diagnosis not present

## 2021-09-10 DIAGNOSIS — Z113 Encounter for screening for infections with a predominantly sexual mode of transmission: Secondary | ICD-10-CM | POA: Diagnosis not present

## 2021-09-10 HISTORY — DX: Persons encountering health services in other specified circumstances: Z76.89

## 2021-09-10 HISTORY — DX: Contact with and (suspected) exposure to infections with a predominantly sexual mode of transmission: Z20.2

## 2021-09-10 LAB — COMPREHENSIVE METABOLIC PANEL
ALT: 13 U/L (ref 0–53)
AST: 19 U/L (ref 0–37)
Albumin: 4.4 g/dL (ref 3.5–5.2)
Alkaline Phosphatase: 78 U/L (ref 39–117)
BUN: 12 mg/dL (ref 6–23)
CO2: 33 mEq/L — ABNORMAL HIGH (ref 19–32)
Calcium: 9.7 mg/dL (ref 8.4–10.5)
Chloride: 98 mEq/L (ref 96–112)
Creatinine, Ser: 1.27 mg/dL (ref 0.40–1.50)
GFR: 74.41 mL/min (ref >60.00)
Glucose, Bld: 94 mg/dL (ref 70–99)
Potassium: 4.3 mEq/L (ref 3.5–5.1)
Sodium: 137 mEq/L (ref 135–145)
Total Bilirubin: 0.4 mg/dL (ref 0.2–1.2)
Total Protein: 8 g/dL (ref 6.0–8.3)

## 2021-09-10 NOTE — Assessment & Plan Note (Signed)
BP slightly high today, pt reports not taking meds yet this am, advised checking later today at home after taking and notify office if still elevated.
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
## 2021-09-10 NOTE — Assessment & Plan Note (Signed)
Pt has had a steady partner for a good while, but wants testing for reassurance, denies sx today.

## 2021-09-10 NOTE — Patient Instructions (Signed)

## 2021-09-10 NOTE — Telephone Encounter (Signed)
Patient called back in response to what was discussed at his visit yesterday regarding a letter for him. He said that he spoke to his lawyer and it was recommended that they not speak directly to you so that there is not the possibility of swaying the information if it were to go to trial.  He said that they mentioned the letter should state if there are any partial or permanent disabilities that would go along with the current pain and suffering.

## 2021-09-10 NOTE — Assessment & Plan Note (Addendum)
Stable on 300mg  Bupropion and Zoloft qd.
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
## 2021-09-13 LAB — URINE CYTOLOGY ANCILLARY ONLY
Bacterial Vaginitis-Urine: NEGATIVE
Chlamydia: NEGATIVE
Comment: NEGATIVE
Comment: NEGATIVE
Comment: NORMAL
Neisseria Gonorrhea: NEGATIVE
Trichomonas: NEGATIVE

## 2021-09-14 ENCOUNTER — Telehealth: Payer: Self-pay | Admitting: Family Medicine

## 2021-09-14 ENCOUNTER — Ambulatory Visit (INDEPENDENT_AMBULATORY_CARE_PROVIDER_SITE_OTHER): Payer: Federal, State, Local not specified - PPO | Admitting: Adult Health

## 2021-09-14 ENCOUNTER — Encounter (INDEPENDENT_AMBULATORY_CARE_PROVIDER_SITE_OTHER): Payer: Self-pay | Admitting: Adult Health

## 2021-09-14 ENCOUNTER — Encounter: Payer: Self-pay | Admitting: Family Medicine

## 2021-09-14 ENCOUNTER — Other Ambulatory Visit: Payer: Self-pay

## 2021-09-14 VITALS — BP 117/80 | HR 96 | Temp 98.6°F | Ht 71.0 in | Wt 369.0 lb

## 2021-09-14 DIAGNOSIS — F419 Anxiety disorder, unspecified: Secondary | ICD-10-CM | POA: Diagnosis not present

## 2021-09-14 DIAGNOSIS — F32A Depression, unspecified: Secondary | ICD-10-CM | POA: Diagnosis not present

## 2021-09-14 DIAGNOSIS — Z9189 Other specified personal risk factors, not elsewhere classified: Secondary | ICD-10-CM

## 2021-09-14 DIAGNOSIS — R7309 Other abnormal glucose: Secondary | ICD-10-CM | POA: Diagnosis not present

## 2021-09-14 DIAGNOSIS — Z6841 Body Mass Index (BMI) 40.0 and over, adult: Secondary | ICD-10-CM

## 2021-09-14 MED ORDER — OZEMPIC (0.25 OR 0.5 MG/DOSE) 2 MG/1.5ML ~~LOC~~ SOPN: 0.5000 mg | PEN_INJECTOR | SUBCUTANEOUS | 0 refills | Status: DC

## 2021-09-14 NOTE — Telephone Encounter (Signed)
Letter sent via my chart. Look it over and if you think it its ok I can fax it to your attorney.  What is your attorney's fax number and name?

## 2021-09-14 NOTE — Progress Notes (Signed)
[facility]VC March 2021

## 2021-09-14 NOTE — Telephone Encounter (Signed)
Pt appreciative of letter written today by Dr. Corey, requests we add a part about future treatments ( massage therapy, chiro, injections )   Also needs signed copy for lawyer so cannot use MyChart. Please email signed copy to [email or web address] 

## 2021-09-14 NOTE — Telephone Encounter (Signed)
Called patient  No answer and mailbox is full

## 2021-09-15 ENCOUNTER — Other Ambulatory Visit (INDEPENDENT_AMBULATORY_CARE_PROVIDER_SITE_OTHER): Payer: Self-pay | Admitting: Adult Health

## 2021-09-15 DIAGNOSIS — R7309 Other abnormal glucose: Secondary | ICD-10-CM

## 2021-09-15 NOTE — Progress Notes (Signed)
Chief Complaint:   OBESITY Shawn [name] is here to discuss his progress with his obesity treatment plan along with follow-up of his obesity related diagnoses. Shawn [name] is on the Category 4 Plan and states he is following his eating plan approximately 35-40% of the time. Shawn [name] states he is walking for 30-45 minutes 1 time per week.  Today's visit was #: 14 Starting weight: 392 lbs Starting date: 08/10/2020 Today's weight: 369 lbs Today's date: 09/14/2021 Total lbs lost to date: 23 lbs Total lbs lost since last in-office visit: 0  Interim History: Shawn [name] has started consuming [employer] vanilla each morning instead of skipping breakfast. Started on Ozempic 0.25 mg on 04/06/2021 and increased to 0.5 mg on 06/22/2021.  Subjective:   1. Elevated random blood glucose level Started on Ozempic 0.25 mg on 04/06/2021 and increased to 0.5 mg on 06/22/2021. Hunger level stable on Ozempic 0.5 mg. She denies mass in neck, dysphagia, dyspepsia, or persistent hoarseness.  2. Anxiety and depression He has been off sertraline 100 mg for >6 months. He has remained on bupropion XL 300 mg QD- managed by PCP. He reports stable mood, denies SI/HI.  3. At risk for nausea Shawn [name] is at risk for nausea due to taking a GLP-1 for IFG.  Assessment/Plan:   1. Elevated random blood glucose level Refill Ozempic 0.5 mg once weekly, as per below.  - Refill Semaglutide,0.25 or 0.5MG /DOS, (OZEMPIC, 0.25 OR 0.5 MG/DOSE,) 2 MG/1.5ML SOPN; Inject 0.5 mg into the skin once a week.  Dispense: 3 mL; Refill: 0  2. Anxiety and depression Discontinued sertraline 100 mg from medication list- pt has been off SSRI for >6 months.  3. At risk for nausea Shawn [name] was given approximately 15 minutes of nausea prevention counseling today. Shawn [name] is at risk for nausea due to his new or current medication. He was encouraged to titrate his medication slowly, make sure to stay hydrated, eat smaller portions  throughout the day, and avoid high fat meals.   4. Obesity, current BMI 51.5  Shawn [name] is currently in the action stage of change. As such, his goal is to continue with weight loss efforts. He has agreed to the Category 4 Plan.   Check fasting labs at next office visit.  Exercise goals:  As is.  Behavioral modification strategies: increasing lean protein intake, decreasing simple carbohydrates, meal planning and cooking strategies, keeping healthy foods in the home, and planning for success.  Shawn [name] has agreed to follow-up with our clinic in 2-3 weeks, fasting. He was informed of the importance of frequent follow-up visits to maximize his success with intensive lifestyle modifications for his multiple health conditions.   Objective:   Blood pressure 117/80, pulse 96, temperature 98.6 F (37 C), height 5\' 11"  (1.803 m), weight (!) 369 lb (167.4 kg), SpO2 98 %. Body mass index is 51.47 kg/m.  General: Cooperative, alert, well developed, in no acute distress. HEENT: Conjunctivae and lids unremarkable. Cardiovascular: Regular rhythm.  Lungs: Normal work of breathing. Neurologic: No focal deficits.   Lab Results  Component Value Date   CREATININE 1.27 09/10/2021   BUN 12 09/10/2021   NA 137 09/10/2021   K 4.3 09/10/2021   CL 98 09/10/2021   CO2 33 (H) 09/10/2021   Lab Results  Component Value Date   ALT 13 09/10/2021   AST 19 09/10/2021   ALKPHOS 78 09/10/2021   BILITOT 0.4 09/10/2021   Lab Results  Component Value Date   HGBA1C 5.6 06/01/2021  HGBA1C 5.5 08/10/2020   HGBA1C 5.1 12/11/2012   Lab Results  Component Value Date   INSULIN 138.0 (H) 06/01/2021   INSULIN 28.3 (H) 08/10/2020   Lab Results  Component Value Date   TSH 1.020 08/10/2020   Lab Results  Component Value Date   CHOL 212 (H) 08/10/2020   HDL 33 (L) 08/10/2020   LDLCALC 158 (H) 08/10/2020   LDLDIRECT 141 (H) 12/11/2012   TRIG 114 08/10/2020   CHOLHDL 5 03/23/2020   Lab Results   Component Value Date   VD25OH 30.7 06/01/2021   VD25OH 24.1 (L) 08/10/2020   VD25OH 11.43 (L) 03/23/2020   Lab Results  Component Value Date   WBC 5.3 08/10/2020   HGB 13.8 08/10/2020   HCT 41.6 08/10/2020   MCV 85 08/10/2020   PLT 355 08/10/2020   Attestation Statements:   Reviewed by clinician on day of visit: allergies, medications, problem list, medical history, surgical history, family history, social history, and previous encounter notes.  I, Insurance claims handler, CMA, am acting as Energy manager for William Hamburger, NP.  I have reviewed the above documentation for accuracy and completeness, and I agree with the above. -  Sharyon Peitz d. Allex Madia, NP-C

## 2021-09-16 ENCOUNTER — Encounter: Payer: Self-pay | Admitting: Family Medicine

## 2021-09-16 NOTE — Progress Notes (Signed)
Pt appreciative of letter written today by Dr. Denyse Amass, requests we add a part about future treatments ( massage therapy, chiro, injections )   Also needs signed copy for lawyer so cannot use MyChart. Please email signed copy to [email or web address]

## 2021-09-16 NOTE — Telephone Encounter (Signed)
Revised letter emailed to pt per request.

## 2021-09-24 DIAGNOSIS — F4320 Adjustment disorder, unspecified: Secondary | ICD-10-CM | POA: Diagnosis not present

## 2021-09-30 ENCOUNTER — Ambulatory Visit (INDEPENDENT_AMBULATORY_CARE_PROVIDER_SITE_OTHER): Payer: Federal, State, Local not specified - PPO | Admitting: Adult Health

## 2021-09-30 ENCOUNTER — Other Ambulatory Visit: Payer: Self-pay

## 2021-09-30 ENCOUNTER — Encounter (INDEPENDENT_AMBULATORY_CARE_PROVIDER_SITE_OTHER): Payer: Self-pay | Admitting: Adult Health

## 2021-09-30 VITALS — BP 122/87 | HR 77 | Temp 98.1°F | Ht 71.0 in | Wt 368.0 lb

## 2021-09-30 DIAGNOSIS — Z9189 Other specified personal risk factors, not elsewhere classified: Secondary | ICD-10-CM

## 2021-09-30 DIAGNOSIS — I10 Essential (primary) hypertension: Secondary | ICD-10-CM | POA: Diagnosis not present

## 2021-09-30 DIAGNOSIS — Z6841 Body Mass Index (BMI) 40.0 and over, adult: Secondary | ICD-10-CM

## 2021-09-30 DIAGNOSIS — E8881 Metabolic syndrome: Secondary | ICD-10-CM | POA: Diagnosis not present

## 2021-09-30 DIAGNOSIS — E559 Vitamin D deficiency, unspecified: Secondary | ICD-10-CM | POA: Diagnosis not present

## 2021-10-01 LAB — INSULIN, RANDOM: INSULIN: 36.4 u[IU]/mL — ABNORMAL HIGH (ref 2.6–24.9)

## 2021-10-01 LAB — HEMOGLOBIN A1C
Est. average glucose Bld gHb Est-mCnc: 108 mg/dL
Hgb A1c MFr Bld: 5.4 % (ref 4.8–5.6)

## 2021-10-01 LAB — LIPID PANEL
Chol/HDL Ratio: 6.2 ratio — ABNORMAL HIGH (ref 0.0–5.0)
Cholesterol, Total: 217 mg/dL — ABNORMAL HIGH (ref 100–199)
HDL: 35 mg/dL — ABNORMAL LOW (ref >39)
LDL Chol Calc (NIH): 159 mg/dL — ABNORMAL HIGH (ref 0–99)
Triglycerides: 126 mg/dL (ref 0–149)
VLDL Cholesterol Cal: 23 mg/dL (ref 5–40)

## 2021-10-01 LAB — VITAMIN D 25 HYDROXY (VIT D DEFICIENCY, FRACTURES): Vit D, 25-Hydroxy: 33.9 ng/mL (ref 30.0–100.0)
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
## 2021-10-06 DIAGNOSIS — G4733 Obstructive sleep apnea (adult) (pediatric): Secondary | ICD-10-CM | POA: Diagnosis not present

## 2021-10-07 ENCOUNTER — Ambulatory Visit (INDEPENDENT_AMBULATORY_CARE_PROVIDER_SITE_OTHER): Payer: Federal, State, Local not specified - PPO | Admitting: Adult Health

## 2021-10-08 DIAGNOSIS — M9902 Segmental and somatic dysfunction of thoracic region: Secondary | ICD-10-CM | POA: Diagnosis not present

## 2021-10-08 DIAGNOSIS — M9903 Segmental and somatic dysfunction of lumbar region: Secondary | ICD-10-CM | POA: Diagnosis not present

## 2021-10-08 DIAGNOSIS — M9904 Segmental and somatic dysfunction of sacral region: Secondary | ICD-10-CM | POA: Diagnosis not present

## 2021-10-08 DIAGNOSIS — M9901 Segmental and somatic dysfunction of cervical region: Secondary | ICD-10-CM | POA: Diagnosis not present

## 2021-10-21 DIAGNOSIS — M9902 Segmental and somatic dysfunction of thoracic region: Secondary | ICD-10-CM | POA: Diagnosis not present

## 2021-10-21 DIAGNOSIS — M9904 Segmental and somatic dysfunction of sacral region: Secondary | ICD-10-CM | POA: Diagnosis not present

## 2021-10-21 DIAGNOSIS — M9903 Segmental and somatic dysfunction of lumbar region: Secondary | ICD-10-CM | POA: Diagnosis not present

## 2021-10-21 DIAGNOSIS — M9901 Segmental and somatic dysfunction of cervical region: Secondary | ICD-10-CM | POA: Diagnosis not present

## 2021-10-27 ENCOUNTER — Encounter (INDEPENDENT_AMBULATORY_CARE_PROVIDER_SITE_OTHER): Payer: Self-pay | Admitting: Adult Health

## 2021-10-27 ENCOUNTER — Ambulatory Visit (INDEPENDENT_AMBULATORY_CARE_PROVIDER_SITE_OTHER): Payer: Federal, State, Local not specified - PPO | Admitting: Adult Health

## 2021-10-27 ENCOUNTER — Other Ambulatory Visit: Payer: Self-pay

## 2021-10-27 VITALS — BP 110/72 | HR 93 | Temp 98.5°F | Ht 71.0 in | Wt 369.0 lb

## 2021-10-27 DIAGNOSIS — E7800 Pure hypercholesterolemia, unspecified: Secondary | ICD-10-CM | POA: Diagnosis not present

## 2021-10-27 DIAGNOSIS — R7309 Other abnormal glucose: Secondary | ICD-10-CM | POA: Diagnosis not present

## 2021-10-27 DIAGNOSIS — G4733 Obstructive sleep apnea (adult) (pediatric): Secondary | ICD-10-CM

## 2021-10-27 DIAGNOSIS — E559 Vitamin D deficiency, unspecified: Secondary | ICD-10-CM

## 2021-10-27 DIAGNOSIS — Z9189 Other specified personal risk factors, not elsewhere classified: Secondary | ICD-10-CM | POA: Diagnosis not present

## 2021-10-27 DIAGNOSIS — Z6841 Body Mass Index (BMI) 40.0 and over, adult: Secondary | ICD-10-CM

## 2021-10-27 MED ORDER — SEMAGLUTIDE (1 MG/DOSE) 4 MG/3ML ~~LOC~~ SOPN: 1.0000 mg | PEN_INJECTOR | SUBCUTANEOUS | 0 refills | Status: DC
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
## 2021-10-29 DIAGNOSIS — F4320 Adjustment disorder, unspecified: Secondary | ICD-10-CM | POA: Diagnosis not present

## 2021-11-04 DIAGNOSIS — F4320 Adjustment disorder, unspecified: Secondary | ICD-10-CM | POA: Diagnosis not present

## 2021-11-05 DIAGNOSIS — G4733 Obstructive sleep apnea (adult) (pediatric): Secondary | ICD-10-CM | POA: Diagnosis not present

## 2021-11-18 DIAGNOSIS — F4320 Adjustment disorder, unspecified: Secondary | ICD-10-CM | POA: Diagnosis not present

## 2021-11-29 ENCOUNTER — Ambulatory Visit (INDEPENDENT_AMBULATORY_CARE_PROVIDER_SITE_OTHER): Payer: Federal, State, Local not specified - PPO | Admitting: Adult Health

## 2021-11-29 ENCOUNTER — Encounter (INDEPENDENT_AMBULATORY_CARE_PROVIDER_SITE_OTHER): Payer: Self-pay | Admitting: Adult Health

## 2021-11-29 ENCOUNTER — Other Ambulatory Visit: Payer: Self-pay

## 2021-11-29 VITALS — BP 109/69 | HR 89 | Temp 98.4°F | Ht 71.0 in | Wt 363.0 lb

## 2021-11-29 DIAGNOSIS — Z9189 Other specified personal risk factors, not elsewhere classified: Secondary | ICD-10-CM | POA: Diagnosis not present

## 2021-11-29 DIAGNOSIS — Z6841 Body Mass Index (BMI) 40.0 and over, adult: Secondary | ICD-10-CM

## 2021-11-29 DIAGNOSIS — R7309 Other abnormal glucose: Secondary | ICD-10-CM | POA: Diagnosis not present

## 2021-11-29 MED ORDER — SEMAGLUTIDE (1 MG/DOSE) 4 MG/3ML ~~LOC~~ SOPN: 1.0000 mg | PEN_INJECTOR | SUBCUTANEOUS | 0 refills | Status: DC

## 2021-11-30 NOTE — Progress Notes (Signed)
Chief Complaint:   OBESITY Shawn [name] is here to discuss his progress with his obesity treatment plan along with follow-up of his obesity related diagnoses. Shawn [name] is on the Category 4 Plan and keeping a food journal and adhering to recommended goals of 550-700 calories and 45 grams of protein at supper and states he is following his eating plan approximately 15% of the time. Shawn [name] states he is walking for 15 minutes 1 time per week.  Today's visit was #: 17 Starting weight: 392 lbs Starting date: 08/10/2020 Today's weight: 363 lbs Today's date: 11/29/2021 Total lbs lost to date: 29 lbs Total lbs lost since last in-office visit: 6 lbs  Interim History:  Shawn [name] ate off plan during the holidays.  He estimates to have followed the plan 15% of the time over the last month. 2023 Goals: He would like to lose 50 pounds and increase cardiovascular health.  Subjective:   1. Elevated random blood glucose level Insulin on 09/30/2021, 36.4.  Insulin on 06/01/2021, 138. He is on Ozempic 1 mg- once week. He denies mass in neck, dysphagia, dyspepsia, or persistent hoarseness.  2. At risk for diabetes mellitus Shawn [name] is at higher than average risk for developing diabetes due to quite elevated insulin resistance.  Assessment/Plan:   1. Elevated random blood glucose level Refill Ozempic 1 mg once weekly, as per below.  - Refill Semaglutide, 1 MG/DOSE, 4 MG/3ML SOPN; Inject 1 mg as directed once a week.  Dispense: 3 mL; Refill: 0  2. At risk for diabetes mellitus Shawn [name] was given approximately 15 minutes of diabetes education and counseling today. We discussed intensive lifestyle modifications today with an emphasis on weight loss as well as increasing exercise and decreasing simple carbohydrates in his diet. We also reviewed medication options with an emphasis on risk versus benefit of those discussed.   Repetitive spaced learning was employed today to elicit superior memory  formation and behavioral change.  3. Obesity, current BMI 50.7  Shawn [name] is currently in the action stage of change. As such, his goal is to continue with weight loss efforts. He has agreed to the Category 4 Plan and keeping a food journal and adhering to recommended goals of 550-700 calories and 45 grams of protein for supper.   Exercise goals:  As is.  Behavioral modification strategies: increasing lean protein intake, decreasing simple carbohydrates, meal planning and cooking strategies, keeping healthy foods in the home, and planning for success.  Shawn [name] has agreed to follow-up with our clinic in 3-4 weeks. He was informed of the importance of frequent follow-up visits to maximize his success with intensive lifestyle modifications for his multiple health conditions.   Objective:   Blood pressure 109/69, pulse 89, temperature 98.4 F (36.9 C), height 5\' 11"  (1.803 m), weight (!) 363 lb (164.7 kg), SpO2 97 %. Body mass index is 50.63 kg/m.  General: Cooperative, alert, well developed, in no acute distress. HEENT: Conjunctivae and lids unremarkable. Cardiovascular: Regular rhythm.  Lungs: Normal work of breathing. Neurologic: No focal deficits.   Lab Results  Component Value Date   CREATININE 1.27 09/10/2021   BUN 12 09/10/2021   NA 137 09/10/2021   K 4.3 09/10/2021   CL 98 09/10/2021   CO2 33 (H) 09/10/2021   Lab Results  Component Value Date   ALT 13 09/10/2021   AST 19 09/10/2021   ALKPHOS 78 09/10/2021   BILITOT 0.4 09/10/2021   Lab Results  Component Value Date   HGBA1C 5.4 09/30/2021  HGBA1C 5.6 06/01/2021   HGBA1C 5.5 08/10/2020   HGBA1C 5.1 12/11/2012   Lab Results  Component Value Date   INSULIN 36.4 (H) 09/30/2021   INSULIN 138.0 (H) 06/01/2021   INSULIN 28.3 (H) 08/10/2020   Lab Results  Component Value Date   TSH 1.020 08/10/2020   Lab Results  Component Value Date   CHOL 217 (H) 09/30/2021   HDL 35 (L) 09/30/2021   LDLCALC 159 (H)  09/30/2021   LDLDIRECT 141 (H) 12/11/2012   TRIG 126 09/30/2021   CHOLHDL 6.2 (H) 09/30/2021   Lab Results  Component Value Date   VD25OH 33.9 09/30/2021   VD25OH 30.7 06/01/2021   VD25OH 24.1 (L) 08/10/2020   Lab Results  Component Value Date   WBC 5.3 08/10/2020   HGB 13.8 08/10/2020   HCT 41.6 08/10/2020   MCV 85 08/10/2020   PLT 355 08/10/2020   Attestation Statements:   Reviewed by clinician on day of visit: allergies, medications, problem list, medical history, surgical history, family history, social history, and previous encounter notes.  I, Insurance claims handler, CMA, am acting as Energy manager for William Hamburger, NP.  I have reviewed the above documentation for accuracy and completeness, and I agree with the above. -  Leroi Haque d. Taite Schoeppner, NP-C

## 2021-12-03 DIAGNOSIS — F4320 Adjustment disorder, unspecified: Secondary | ICD-10-CM | POA: Diagnosis not present

## 2021-12-06 DIAGNOSIS — G4733 Obstructive sleep apnea (adult) (pediatric): Secondary | ICD-10-CM | POA: Diagnosis not present

## 2021-12-17 DIAGNOSIS — F4320 Adjustment disorder, unspecified: Secondary | ICD-10-CM | POA: Diagnosis not present

## 2021-12-21 ENCOUNTER — Ambulatory Visit (INDEPENDENT_AMBULATORY_CARE_PROVIDER_SITE_OTHER): Payer: Federal, State, Local not specified - PPO | Admitting: Adult Health

## 2021-12-21 ENCOUNTER — Other Ambulatory Visit: Payer: Self-pay

## 2021-12-21 VITALS — BP 116/80 | HR 96 | Temp 97.9°F | Ht 71.0 in | Wt 361.0 lb

## 2021-12-21 DIAGNOSIS — R7309 Other abnormal glucose: Secondary | ICD-10-CM

## 2021-12-21 DIAGNOSIS — Z6841 Body Mass Index (BMI) 40.0 and over, adult: Secondary | ICD-10-CM | POA: Diagnosis not present

## 2021-12-21 DIAGNOSIS — E669 Obesity, unspecified: Secondary | ICD-10-CM | POA: Diagnosis not present

## 2021-12-21 DIAGNOSIS — I10 Essential (primary) hypertension: Secondary | ICD-10-CM

## 2021-12-21 DIAGNOSIS — Z9189 Other specified personal risk factors, not elsewhere classified: Secondary | ICD-10-CM

## 2021-12-21 MED ORDER — SEMAGLUTIDE (1 MG/DOSE) 4 MG/3ML ~~LOC~~ SOPN: 1.0000 mg | PEN_INJECTOR | SUBCUTANEOUS | 0 refills | Status: DC

## 2021-12-21 NOTE — Progress Notes (Signed)
Chief Complaint:   OBESITY Shawn [name] is here to discuss his progress with his obesity treatment plan along with follow-up of his obesity related diagnoses. Shawn [name] is on the Category 4 Plan and keeping a food journal and adhering to recommended goals of 550-700 calories and 45 grams of protein and states he is following his eating plan approximately 45% of the time. Shawn [name] states he is not exercising regularly at this time.  Today's visit was #: 18 Starting weight: 392 lbs Starting date: 08/10/2020 Today's weight: 361 lbs Today's date: 12/21/2021 Total lbs lost to date: 31 lbs Total lbs lost since last in-office visit: 2 lbs  Interim History:  Shawn [name] says that recently he finds that he will skip breakfast - only coffee.   His first meal is lunch - baked chicken wings, vegetable. Late afternoon snack - air fried hot dog. Dinner - leftovers - grilled chicken, vegetables.  2023 Health/Wellness Goals: 1) Lose 50 pounds this year. 2) Increase activity.  Subjective:   1. Essential hypertension BP/HR excellent at office visit. On 10/11/2021, CMP - electrolytes/kidney function stable.  2. Elevated random blood glucose level Ozempic 1 mg once week- denies mass in neck, dysphagia, dyspepsia, persistent hoarseness, or GI upset.  3. At risk for nausea Conor is at risk for nausea due to Ozempic, elevated BG.  Assessment/Plan:   1. Essential hypertension Check labs at next office visit.  2. Elevated random blood glucose level Check labs at next office visit. Refill Ozempic 1 mg once weekly.  - Refill Semaglutide, 1 MG/DOSE, 4 MG/3ML SOPN; Inject 1 mg as directed once a week.  Dispense: 3 mL; Refill: 0  3. At risk for nausea Shawn [name] was given approximately 15 minutes of nausea prevention counseling today. Shawn [name] is at risk for nausea due to his new or current medication. He was encouraged to titrate his medication slowly, make sure to stay hydrated, eat  smaller portions throughout the day, and avoid high fat meals.   4. Obesity, current BMI 50.4  Shawn [name] is currently in the action stage of change. As such, his goal is to continue with weight loss efforts. He has agreed to the Category 4 Plan and keeping a food journal and adhering to recommended goals of 550-700 calories and 45 grams of protein.   Check fasting labs at next office visit.  Exercise goals:  Increase daily walking.  Behavioral modification strategies: increasing lean protein intake, decreasing simple carbohydrates, meal planning and cooking strategies, keeping healthy foods in the home, and planning for success.  Shawn [name] has agreed to follow-up with our clinic in 3 weeks, fasting. He was informed of the importance of frequent follow-up visits to maximize his success with intensive lifestyle modifications for his multiple health conditions.   Objective:   Blood pressure 116/80, pulse 96, temperature 97.9 F (36.6 C), height 5\' 11"  (1.803 m), weight (!) 361 lb (163.7 kg), SpO2 99 %. Body mass index is 50.35 kg/m.  General: Cooperative, alert, well developed, in no acute distress. HEENT: Conjunctivae and lids unremarkable. Cardiovascular: Regular rhythm.  Lungs: Normal work of breathing. Neurologic: No focal deficits.   Lab Results  Component Value Date   CREATININE 1.27 09/10/2021   BUN 12 09/10/2021   NA 137 09/10/2021   K 4.3 09/10/2021   CL 98 09/10/2021   CO2 33 (H) 09/10/2021   Lab Results  Component Value Date   ALT 13 09/10/2021   AST 19 09/10/2021   ALKPHOS 78 09/10/2021  BILITOT 0.4 09/10/2021   Lab Results  Component Value Date   HGBA1C 5.4 09/30/2021   HGBA1C 5.6 06/01/2021   HGBA1C 5.5 08/10/2020   HGBA1C 5.1 12/11/2012   Lab Results  Component Value Date   INSULIN 36.4 (H) 09/30/2021   INSULIN 138.0 (H) 06/01/2021   INSULIN 28.3 (H) 08/10/2020   Lab Results  Component Value Date   TSH 1.020 08/10/2020   Lab Results  Component  Value Date   CHOL 217 (H) 09/30/2021   HDL 35 (L) 09/30/2021   LDLCALC 159 (H) 09/30/2021   LDLDIRECT 141 (H) 12/11/2012   TRIG 126 09/30/2021   CHOLHDL 6.2 (H) 09/30/2021   Lab Results  Component Value Date   VD25OH 33.9 09/30/2021   VD25OH 30.7 06/01/2021   VD25OH 24.1 (L) 08/10/2020   Lab Results  Component Value Date   WBC 5.3 08/10/2020   HGB 13.8 08/10/2020   HCT 41.6 08/10/2020   MCV 85 08/10/2020   PLT 355 08/10/2020   Attestation Statements:   Reviewed by clinician on day of visit: allergies, medications, problem list, medical history, surgical history, family history, social history, and previous encounter notes.  I, Insurance claims handler, CMA, am acting as Energy manager for William Hamburger, NP.  I have reviewed the above documentation for accuracy and completeness, and I agree with the above. -  Fatimata Talsma d. Alsha Meland, NP-C

## 2022-01-07 DIAGNOSIS — G4733 Obstructive sleep apnea (adult) (pediatric): Secondary | ICD-10-CM | POA: Diagnosis not present

## 2022-01-11 ENCOUNTER — Encounter (INDEPENDENT_AMBULATORY_CARE_PROVIDER_SITE_OTHER): Payer: Self-pay | Admitting: Adult Health

## 2022-01-11 ENCOUNTER — Other Ambulatory Visit: Payer: Self-pay

## 2022-01-11 ENCOUNTER — Ambulatory Visit (INDEPENDENT_AMBULATORY_CARE_PROVIDER_SITE_OTHER): Payer: Federal, State, Local not specified - PPO | Admitting: Adult Health

## 2022-01-11 VITALS — BP 108/76 | HR 100 | Temp 98.1°F | Ht 71.0 in | Wt 361.0 lb

## 2022-01-11 DIAGNOSIS — R7309 Other abnormal glucose: Secondary | ICD-10-CM

## 2022-01-11 DIAGNOSIS — E669 Obesity, unspecified: Secondary | ICD-10-CM | POA: Diagnosis not present

## 2022-01-11 DIAGNOSIS — R000 Tachycardia, unspecified: Secondary | ICD-10-CM | POA: Diagnosis not present

## 2022-01-11 DIAGNOSIS — Z6841 Body Mass Index (BMI) 40.0 and over, adult: Secondary | ICD-10-CM | POA: Diagnosis not present

## 2022-01-11 MED ORDER — SEMAGLUTIDE (1 MG/DOSE) 4 MG/3ML ~~LOC~~ SOPN: 1.0000 mg | PEN_INJECTOR | SUBCUTANEOUS | 0 refills | Status: DC

## 2022-01-12 DIAGNOSIS — R000 Tachycardia, unspecified: Secondary | ICD-10-CM | POA: Insufficient documentation

## 2022-01-12 NOTE — Progress Notes (Signed)
Chief Complaint:   OBESITY Shawn [name] is here to discuss his progress with his obesity treatment plan along with follow-up of his obesity related diagnoses. Shawn [name] is on the Category 4 Plan and keeping a food journal and adhering to recommended goals of 550-700 calories and 45 grams of protein at supper and states he is following his eating plan approximately 35% of the time. Shawn [name] states he is not exercising regularly at this time.  Today's visit was #: 19 Starting weight: 392 lbs Starting date: 08/10/2020 Today's weight: 361 lbs Today's date: 01/11/2022 Total lbs lost to date: 31 lbs Total lbs lost since last in-office visit: 0  Interim History:  Shawn [name] was unable to complete a fasting lab draw today. He endorses the following typical day of intake- Breakfast - skips. Lunch (1300-1400) - chicken, vegetable. Snack after work (1730-1830) - sandwich, grapes, pineapple. Dinner (2030) - protein, vegetable, starch.  He gets 7-8 hours of sleep at night.  Subjective:   1. Elevated random blood glucose level He is on Ozempic 1 mg- denies mass in neck, dysphagia, dyspepsia, persistent hoarseness, abd pain, or GI upset. With the addition of GLP-1 therapy, he now consumes smaller portion of meals.  2. Increased heart rate HR today 100  He consumed 2 cups of coffee 0900-1100. Snapple at 1330, then sips on way into clinic. He denies acute cardiac symptoms. He denies tobacco/vape use. EKG- 08/10/20- stable  Assessment/Plan:   1. Elevated random blood glucose level Refill Ozempic 1 mg subcutaneously once weekly. Check fasting labs at next office visit.  - Refill Semaglutide, 1 MG/DOSE, 4 MG/3ML SOPN; Inject 1 mg as directed once a week.  Dispense: 3 mL; Refill: 0  2. Increased heart rate Monitor HR, decrease caffeine use.  3. Obesity, current BMI 50.3  Shawn [name] is currently in the action stage of change. As such, his goal is to continue with weight loss efforts. He has  agreed to the Category 4 Plan and keeping a food journal and adhering to recommended goals of 550-700 calories and 45 grams of protein at supper.   Check fasting labs at next office visit.  Exercise goals:  Walking for 5 minutes 2 times per week.  Behavioral modification strategies: increasing lean protein intake, decreasing simple carbohydrates, meal planning and cooking strategies, keeping healthy foods in the home, and planning for success.  Shawn [name] has agreed to follow-up with our clinic in 3-4 weeks, fasting. He was informed of the importance of frequent follow-up visits to maximize his success with intensive lifestyle modifications for his multiple health conditions.   Objective:   Blood pressure 108/76, pulse 100, temperature 98.1 F (36.7 C), height 5\' 11"  (1.803 m), weight (!) 361 lb (163.7 kg), SpO2 98 %. Body mass index is 50.35 kg/m.  General: Cooperative, alert, well developed, in no acute distress. HEENT: Conjunctivae and lids unremarkable. Cardiovascular: Regular rhythm.  Lungs: Normal work of breathing. Neurologic: No focal deficits.   Lab Results  Component Value Date   CREATININE 1.27 09/10/2021   BUN 12 09/10/2021   NA 137 09/10/2021   K 4.3 09/10/2021   CL 98 09/10/2021   CO2 33 (H) 09/10/2021   Lab Results  Component Value Date   ALT 13 09/10/2021   AST 19 09/10/2021   ALKPHOS 78 09/10/2021   BILITOT 0.4 09/10/2021   Lab Results  Component Value Date   HGBA1C 5.4 09/30/2021   HGBA1C 5.6 06/01/2021   HGBA1C 5.5 08/10/2020   HGBA1C 5.1 12/11/2012  Lab Results  Component Value Date   INSULIN 36.4 (H) 09/30/2021   INSULIN 138.0 (H) 06/01/2021   INSULIN 28.3 (H) 08/10/2020   Lab Results  Component Value Date   TSH 1.020 08/10/2020   Lab Results  Component Value Date   CHOL 217 (H) 09/30/2021   HDL 35 (L) 09/30/2021   LDLCALC 159 (H) 09/30/2021   LDLDIRECT 141 (H) 12/11/2012   TRIG 126 09/30/2021   CHOLHDL 6.2 (H) 09/30/2021   Lab  Results  Component Value Date   VD25OH 33.9 09/30/2021   VD25OH 30.7 06/01/2021   VD25OH 24.1 (L) 08/10/2020   Lab Results  Component Value Date   WBC 5.3 08/10/2020   HGB 13.8 08/10/2020   HCT 41.6 08/10/2020   MCV 85 08/10/2020   PLT 355 08/10/2020   Attestation Statements:   Reviewed by clinician on day of visit: allergies, medications, problem list, medical history, surgical history, family history, social history, and previous encounter notes.  Time spent on visit including pre-visit chart review and post-visit care and charting was 29 minutes.   I, Insurance claims handler, CMA, am acting as Energy manager for William Hamburger, NP.  I have reviewed the above documentation for accuracy and completeness, and I agree with the above. -  Blessyn [place] d. Derinda Bartus, NP-C

## 2022-01-13 DIAGNOSIS — F4320 Adjustment disorder, unspecified: Secondary | ICD-10-CM | POA: Diagnosis not present

## 2022-02-04 DIAGNOSIS — G4733 Obstructive sleep apnea (adult) (pediatric): Secondary | ICD-10-CM | POA: Diagnosis not present

## 2022-02-08 ENCOUNTER — Ambulatory Visit (INDEPENDENT_AMBULATORY_CARE_PROVIDER_SITE_OTHER): Payer: Federal, State, Local not specified - PPO | Admitting: Adult Health

## 2022-02-08 ENCOUNTER — Other Ambulatory Visit: Payer: Self-pay

## 2022-02-08 ENCOUNTER — Encounter (INDEPENDENT_AMBULATORY_CARE_PROVIDER_SITE_OTHER): Payer: Self-pay | Admitting: Adult Health

## 2022-02-08 VITALS — BP 129/80 | HR 73 | Temp 97.8°F | Ht 71.0 in | Wt 360.0 lb

## 2022-02-08 DIAGNOSIS — Z6841 Body Mass Index (BMI) 40.0 and over, adult: Secondary | ICD-10-CM

## 2022-02-08 DIAGNOSIS — Z9189 Other specified personal risk factors, not elsewhere classified: Secondary | ICD-10-CM | POA: Diagnosis not present

## 2022-02-08 DIAGNOSIS — E7800 Pure hypercholesterolemia, unspecified: Secondary | ICD-10-CM

## 2022-02-08 DIAGNOSIS — E669 Obesity, unspecified: Secondary | ICD-10-CM | POA: Diagnosis not present

## 2022-02-08 DIAGNOSIS — E559 Vitamin D deficiency, unspecified: Secondary | ICD-10-CM | POA: Diagnosis not present

## 2022-02-08 DIAGNOSIS — I10 Essential (primary) hypertension: Secondary | ICD-10-CM | POA: Diagnosis not present

## 2022-02-08 DIAGNOSIS — R7309 Other abnormal glucose: Secondary | ICD-10-CM

## 2022-02-08 DIAGNOSIS — Z0000 Encounter for general adult medical examination without abnormal findings: Secondary | ICD-10-CM

## 2022-02-08 MED ORDER — SEMAGLUTIDE (1 MG/DOSE) 4 MG/3ML ~~LOC~~ SOPN: 1.0000 mg | PEN_INJECTOR | SUBCUTANEOUS | 0 refills | Status: DC

## 2022-02-09 ENCOUNTER — Other Ambulatory Visit: Payer: Self-pay | Admitting: Family Medicine

## 2022-02-09 ENCOUNTER — Encounter (INDEPENDENT_AMBULATORY_CARE_PROVIDER_SITE_OTHER): Payer: Self-pay | Admitting: Adult Health

## 2022-02-09 LAB — INSULIN, RANDOM: INSULIN: 25.4 u[IU]/mL — ABNORMAL HIGH (ref 2.6–24.9)

## 2022-02-09 LAB — COMPREHENSIVE METABOLIC PANEL
ALT: 16 IU/L (ref 0–44)
AST: 19 IU/L (ref 0–40)
Albumin/Globulin Ratio: 1.6 (ref 1.2–2.2)
Albumin: 4.5 g/dL (ref 4.0–5.0)
Alkaline Phosphatase: 86 IU/L (ref 44–121)
BUN/Creatinine Ratio: 11 (ref 9–20)
BUN: 13 mg/dL (ref 6–20)
Bilirubin Total: 0.2 mg/dL (ref 0.0–1.2)
CO2: 26 mmol/L (ref 20–29)
Calcium: 9.3 mg/dL (ref 8.7–10.2)
Chloride: 97 mmol/L (ref 96–106)
Creatinine, Ser: 1.18 mg/dL (ref 0.76–1.27)
Globulin, Total: 2.8 g/dL (ref 1.5–4.5)
Glucose: 81 mg/dL (ref 70–99)
Potassium: 4.5 mmol/L (ref 3.5–5.2)
Sodium: 137 mmol/L (ref 134–144)
Total Protein: 7.3 g/dL (ref 6.0–8.5)
eGFR: 84 mL/min/{1.73_m2} (ref >59)

## 2022-02-09 LAB — HEMOGLOBIN A1C
Est. average glucose Bld gHb Est-mCnc: 105 mg/dL
Hgb A1c MFr Bld: 5.3 % (ref 4.8–5.6)

## 2022-02-09 LAB — TSH+T4F+T3FREE
Free T4: 1.22 ng/dL (ref 0.82–1.77)
T3, Free: 2.9 pg/mL (ref 2.0–4.4)
TSH: 1.07 u[IU]/mL (ref 0.450–4.500)

## 2022-02-09 LAB — LIPID PANEL
Chol/HDL Ratio: 6.9 ratio — ABNORMAL HIGH (ref 0.0–5.0)
Cholesterol, Total: 220 mg/dL — ABNORMAL HIGH (ref 100–199)
HDL: 32 mg/dL — ABNORMAL LOW (ref >39)
LDL Chol Calc (NIH): 170 mg/dL — ABNORMAL HIGH (ref 0–99)
Triglycerides: 98 mg/dL (ref 0–149)
VLDL Cholesterol Cal: 18 mg/dL (ref 5–40)

## 2022-02-09 LAB — VITAMIN D 25 HYDROXY (VIT D DEFICIENCY, FRACTURES): Vit D, 25-Hydroxy: 45 ng/mL (ref 30.0–100.0)

## 2022-02-09 NOTE — Progress Notes (Signed)
? ? ? ?Chief Complaint:  ? ?OBESITY ?Shawn [name] is here to discuss his progress with his obesity treatment plan along with follow-up of his obesity related diagnoses. Shawn [name] is on the Category 4 Plan and keeping a food journal and adhering to recommended goals of 550-700 calories and 45 grams of protein at supper and states he is following his eating plan approximately 40% of the time. Shawn [name] states he is walking (brisk) for 5 minutes 2 times per week. ? ?Today's visit was #: 20 ?Starting weight: 392 ?Starting date: 08/10/2020 ?Today's weight: 360 lbs ?Today's date: 02/08/2022 ?Total lbs lost to date: 32 lbs ?Total lbs lost since last in-office visit: 1 lb ? ?Interim History:  ?Shawn [name] provided the following food recall: ?Breakfast - None or protein shake or muffin or banana. He estimates to consume breakfast 3 x week. ?Lunch - Leftovers from dinner. ?Dinner - Protein: is: Salmon,Chicken, Steak, Shrimp paired with one of the following rice, sweet potato, broccoli, cabbage,green beans ?              ?He has annual physical with his PCP 02/17/22. ? ?Subjective:  ? ?1. Essential hypertension ?BP/HR stable. ?He is on HCTZ 25 mg daily, lisinopril 40 mg daily. ?He denies tobacco/vape use. ? ?2. Vitamin D deficiency ?On 09/30/2021, vitamin D level - 33.9. ?He is currently taking prescription ergocalciferol 50,000 IU each week. He denies nausea, vomiting or muscle weakness. ? ?3. Elevated random blood glucose level ?Insulin level also quite elevated - 28-138. ?Ozempic 1 mg once weekly. ?He denies mass in neck, dysphagia, dyspepsia, persistent hoarseness, abd pain, or GI upset. ? ?4. Healthcare maintenance ?Shawn [name]'s thyroid panel was last checked in 07/2020. ?He denies personal or family history of thyroid disorder. ? ?5. At risk for heart disease ?Shawn [name] is at higher than average risk for cardiovascular disease due to hyperlipidemia, IR, and obesity. ? ?Assessment/Plan:  ? ?1. Essential hypertension ?Check labs  today. ?-CMP ?- Lipid panel ? ?2. Vitamin D deficiency ?Check vitamin D level today and MyChart after result and refill ergocalciferol as appropriate. ? ?- VITAMIN D 25 Hydroxy (Vit-D Deficiency, Fractures) ? ?3. Elevated random blood glucose level ?Refill Ozempic 1 mg once weekly. ?Check labs today. ? ?- Comprehensive metabolic panel ?- Hemoglobin A1c ?- Insulin, random ?- Refill Semaglutide, 1 MG/DOSE, 4 MG/3ML SOPN; Inject 1 mg as directed once a week.  Dispense: 3 mL; Refill: 0 ? ?4. Healthcare maintenance ?Check thyroid panel today. ? ?- TSH+T4F+T3Free ? ?5. At risk for heart disease ?Shawn [name] was given approximately 15 minutes of coronary artery disease prevention counseling today. He is 37 y.o. male and has risk factors for heart disease including obesity. We discussed intensive lifestyle modifications today with an emphasis on specific weight loss instructions and strategies. ? ?Repetitive spaced learning was employed today to elicit superior memory formation and behavioral change.  ? ?6. Obesity, current BMI 50.3 ? ?Shawn [name] is currently in the action stage of change. As such, his goal is to continue with weight loss efforts. He has agreed to the Category 4 Plan and keeping a food journal and adhering to recommended goals of 500-700 calories and 45 grams of protein at supper.  ? ?Exercise goals:  Increase walking at work. ? ?Behavioral modification strategies: increasing lean protein intake, decreasing simple carbohydrates, meal planning and cooking strategies, keeping healthy foods in the home, and planning for success. ? ?Shawn [name] has agreed to follow-up with our clinic in 3-4 weeks. He was informed of the importance of frequent follow-up  visits to maximize his success with intensive lifestyle modifications for his multiple health conditions.  ? ?Shawn [name] was informed we would discuss his lab results at his next visit unless there is a critical issue that needs to be addressed sooner. Shawn [name] agreed to  keep his next visit at the agreed upon time to discuss these results. ? ?Objective:  ? ?Blood pressure 129/80, pulse 73, temperature 97.8 ?F (36.6 ?C), height 5\' 11"  (1.803 m), weight (!) 360 lb (163.3 kg), SpO2 99 %. ?Body mass index is 50.21 kg/m?. ? ?General: Cooperative, alert, well developed, in no acute distress. ?HEENT: Conjunctivae and lids unremarkable. ?Cardiovascular: Regular rhythm.  ?Lungs: Normal work of breathing. ?Neurologic: No focal deficits.  ? ?Lab Results  ?Component Value Date  ? CREATININE 1.18 02/08/2022  ? BUN 13 02/08/2022  ? NA 137 02/08/2022  ? K 4.5 02/08/2022  ? CL 97 02/08/2022  ? CO2 26 02/08/2022  ? ?Lab Results  ?Component Value Date  ? ALT 16 02/08/2022  ? AST 19 02/08/2022  ? ALKPHOS 86 02/08/2022  ? BILITOT 0.2 02/08/2022  ? ?Lab Results  ?Component Value Date  ? HGBA1C 5.3 02/08/2022  ? HGBA1C 5.4 09/30/2021  ? HGBA1C 5.6 06/01/2021  ? HGBA1C 5.5 08/10/2020  ? HGBA1C 5.1 12/11/2012  ? ?Lab Results  ?Component Value Date  ? INSULIN 25.4 (H) 02/08/2022  ? INSULIN 36.4 (H) 09/30/2021  ? INSULIN 138.0 (H) 06/01/2021  ? INSULIN 28.3 (H) 08/10/2020  ? ?Lab Results  ?Component Value Date  ? TSH 1.020 08/10/2020  ? ?Lab Results  ?Component Value Date  ? CHOL 220 (H) 02/08/2022  ? HDL 32 (L) 02/08/2022  ? LDLCALC 170 (H) 02/08/2022  ? LDLDIRECT 141 (H) 12/11/2012  ? TRIG 98 02/08/2022  ? CHOLHDL 6.9 (H) 02/08/2022  ? ?Lab Results  ?Component Value Date  ? VD25OH 45.0 02/08/2022  ? VD25OH 33.9 09/30/2021  ? VD25OH 30.7 06/01/2021  ? ?Lab Results  ?Component Value Date  ? WBC 5.3 08/10/2020  ? HGB 13.8 08/10/2020  ? HCT 41.6 08/10/2020  ? MCV 85 08/10/2020  ? PLT 355 08/10/2020  ? ?Attestation Statements:  ? ?Reviewed by clinician on day of visit: allergies, medications, problem list, medical history, surgical history, family history, social history, and previous encounter notes. ? ?I, 08/12/2020, CMA, am acting as Insurance claims handler for Energy manager, NP. ? ?I have reviewed the above  documentation for accuracy and completeness, and I agree with the above. -  Shannan Slinker d. Genoveva Singleton, NP-C ?

## 2022-02-10 ENCOUNTER — Other Ambulatory Visit (INDEPENDENT_AMBULATORY_CARE_PROVIDER_SITE_OTHER): Payer: Self-pay | Admitting: Adult Health

## 2022-02-10 DIAGNOSIS — E559 Vitamin D deficiency, unspecified: Secondary | ICD-10-CM

## 2022-02-10 MED ORDER — VITAMIN D (ERGOCALCIFEROL) 1.25 MG (50000 UNIT) PO CAPS: ORAL_CAPSULE | ORAL | 0 refills | Status: DC

## 2022-02-11 ENCOUNTER — Encounter: Payer: Federal, State, Local not specified - PPO | Admitting: Family

## 2022-02-11 DIAGNOSIS — F4320 Adjustment disorder, unspecified: Secondary | ICD-10-CM | POA: Diagnosis not present

## 2022-02-17 ENCOUNTER — Ambulatory Visit (INDEPENDENT_AMBULATORY_CARE_PROVIDER_SITE_OTHER): Payer: Federal, State, Local not specified - PPO | Admitting: Family

## 2022-02-17 ENCOUNTER — Encounter: Payer: Self-pay | Admitting: Family

## 2022-02-17 VITALS — BP 128/87 | HR 99 | Temp 98.3°F | Ht 71.0 in | Wt 365.1 lb

## 2022-02-17 DIAGNOSIS — G8929 Other chronic pain: Secondary | ICD-10-CM | POA: Diagnosis not present

## 2022-02-17 DIAGNOSIS — M545 Low back pain, unspecified: Secondary | ICD-10-CM | POA: Diagnosis not present

## 2022-02-17 DIAGNOSIS — K219 Gastro-esophageal reflux disease without esophagitis: Secondary | ICD-10-CM | POA: Diagnosis not present

## 2022-02-17 MED ORDER — MELOXICAM 7.5 MG PO TABS: 7.5000 mg | ORAL_TABLET | Freq: Every day | ORAL | 2 refills | Status: DC

## 2022-02-17 MED ORDER — PANTOPRAZOLE SODIUM 20 MG PO TBEC: 20.0000 mg | DELAYED_RELEASE_TABLET | Freq: Every day | ORAL | 0 refills | Status: DC
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
## 2022-02-17 NOTE — Assessment & Plan Note (Signed)
NEW - pt reports taking OTC antacids more often lately. Has been on Ozempic for awhile but along with acidic diet may be worsening sx. Starting Protonix, advised on use & SE. f/u in 6 mos. ?

## 2022-02-17 NOTE — Patient Instructions (Addendum)
It was very nice to see you today! ? ?I have sent generic Protonix to your pharmacy to help your reflux symptoms. As discussed, take 1 pill twice a day or 2 pills daily for 2 weeks, then decrease to 1 pill daily. ?For your back pain I have sent generic Mobic to take daily. Start with 1 pill daily, can increase to 2 pills, or 1 pill twice a day as needed.  ? ?Schedule a 6 month follow up visit or sooner if needed for refills. ? ? ? ?PLEASE NOTE: ? ?If you had any lab tests please let us know if you have not heard back within a few days. You may see your results on MyChart before we have a chance to review them but we will give you a call once they are reviewed by [place]. If we ordered any referrals today, please let us know if you have not heard from their office within the next week.  ? ?Please try these tips to maintain a healthy lifestyle: ? ?Eat most of your calories during the day when you are active. Eliminate processed foods including packaged sweets (pies, cakes, cookies), reduce intake of potatoes, white bread, white pasta, and white rice. Look for whole grain options, oat flour or almond flour. ? ?Each meal should contain half fruits/vegetables, one quarter protein, and one quarter carbs (no bigger than a computer mouse). ? ?Cut down on sweet beverages. This includes juice, soda, and sweet tea. Also watch fruit intake, though this is a healthier sweet option, it still contains natural sugar! Limit to 3 servings daily. ? ?Drink at least 1 glass of water with each meal and aim for at least 8 glasses per day ? ?Exercise at least 150 minutes every week.  ? ?

## 2022-02-17 NOTE — Assessment & Plan Note (Signed)
Taking Flexeril as needed, will try Mobic qd, advised on use & SE and may worsen GERD, also starting Protonix which may offset sx, but advised to always take lowest dose of Mobic if possible. f/u as needed. ?

## 2022-02-25 DIAGNOSIS — M9901 Segmental and somatic dysfunction of cervical region: Secondary | ICD-10-CM | POA: Diagnosis not present

## 2022-02-25 DIAGNOSIS — M9902 Segmental and somatic dysfunction of thoracic region: Secondary | ICD-10-CM | POA: Diagnosis not present

## 2022-02-25 DIAGNOSIS — M9904 Segmental and somatic dysfunction of sacral region: Secondary | ICD-10-CM | POA: Diagnosis not present

## 2022-02-25 DIAGNOSIS — M9903 Segmental and somatic dysfunction of lumbar region: Secondary | ICD-10-CM | POA: Diagnosis not present

## 2022-02-26 ENCOUNTER — Other Ambulatory Visit: Payer: Self-pay | Admitting: Family

## 2022-02-26 DIAGNOSIS — K219 Gastro-esophageal reflux disease without esophagitis: Secondary | ICD-10-CM

## 2022-02-28 ENCOUNTER — Other Ambulatory Visit: Payer: Self-pay | Admitting: Family

## 2022-02-28 DIAGNOSIS — K219 Gastro-esophageal reflux disease without esophagitis: Secondary | ICD-10-CM

## 2022-02-28 NOTE — Telephone Encounter (Signed)
need to call patient and ask what he wants to do - Protonix no longer covered for him, does he care which one I send? would probably recommend the prilosec or Prevacid. OK to send in 90 day supply with 1 refill of whichever he decides, thanks.

## 2022-03-07 DIAGNOSIS — G4733 Obstructive sleep apnea (adult) (pediatric): Secondary | ICD-10-CM | POA: Diagnosis not present

## 2022-03-10 DIAGNOSIS — F4320 Adjustment disorder, unspecified: Secondary | ICD-10-CM | POA: Diagnosis not present

## 2022-03-14 ENCOUNTER — Ambulatory Visit (INDEPENDENT_AMBULATORY_CARE_PROVIDER_SITE_OTHER): Payer: Federal, State, Local not specified - PPO | Admitting: Adult Health

## 2022-03-14 ENCOUNTER — Encounter (INDEPENDENT_AMBULATORY_CARE_PROVIDER_SITE_OTHER): Payer: Self-pay | Admitting: Adult Health

## 2022-03-14 VITALS — BP 114/60 | HR 99 | Temp 98.1°F | Ht 71.0 in | Wt 359.0 lb

## 2022-03-14 DIAGNOSIS — F419 Anxiety disorder, unspecified: Secondary | ICD-10-CM

## 2022-03-14 DIAGNOSIS — F32A Depression, unspecified: Secondary | ICD-10-CM

## 2022-03-14 DIAGNOSIS — E669 Obesity, unspecified: Secondary | ICD-10-CM

## 2022-03-14 DIAGNOSIS — Z6841 Body Mass Index (BMI) 40.0 and over, adult: Secondary | ICD-10-CM

## 2022-03-14 DIAGNOSIS — E559 Vitamin D deficiency, unspecified: Secondary | ICD-10-CM | POA: Diagnosis not present

## 2022-03-14 DIAGNOSIS — Z9189 Other specified personal risk factors, not elsewhere classified: Secondary | ICD-10-CM

## 2022-03-14 DIAGNOSIS — R7309 Other abnormal glucose: Secondary | ICD-10-CM | POA: Diagnosis not present

## 2022-03-14 DIAGNOSIS — Z0000 Encounter for general adult medical examination without abnormal findings: Secondary | ICD-10-CM

## 2022-03-14 MED ORDER — BUPROPION HCL ER (XL) 300 MG PO TB24: 300.0000 mg | ORAL_TABLET | Freq: Every day | ORAL | 0 refills | Status: DC

## 2022-03-14 MED ORDER — VITAMIN D (ERGOCALCIFEROL) 1.25 MG (50000 UNIT) PO CAPS: ORAL_CAPSULE | ORAL | 0 refills | Status: DC

## 2022-03-14 MED ORDER — SEMAGLUTIDE (1 MG/DOSE) 4 MG/3ML ~~LOC~~ SOPN: 1.0000 mg | PEN_INJECTOR | SUBCUTANEOUS | 0 refills | Status: DC

## 2022-03-23 NOTE — Progress Notes (Signed)
? ? ? ?Chief Complaint:  ? ?OBESITY ?Shawn [name] is here to discuss his progress with his obesity treatment plan along with follow-up of his obesity related diagnoses. Shawn [name] is on the Category 4 Plan and keeping a food journal and adhering to recommended goals of 500-700 calories and 45 grams of protein at supper and states he is following his eating plan approximately 10% of the time. Shawn [name] states he is walking for 25 minutes 1-2 times per week. ? ?Today's visit was #: 21 ?Starting weight: 392 lbs ?Starting date: 08/10/2020 ?Today's weight: 359 lbs ?Today's date: 03/14/2022 ?Total lbs lost to date: 33 lbs ?Total lbs lost since last in-office visit: 1 lb ? ?Interim History:  ?Shawn [name] is planning to join the gym this week.  ?He continues to be challenged to follow Cat 4 meal plan consistently- he estimates 10% at present. ? ?Subjective:  ? ?1. Elevated random blood glucose level ?02/08/2022 BG 81, A1c 5.3, Insulin level 25.4.  ?He is on Ozempic 1 mg - denies mass in neck, dysphagia, dyspepsia, persistent hoarseness, abd pain, or N/V/Constipation. ?He injects on Sunday, endorses polyphagia Thursday- Sat ? ?2. Vitamin D deficiency ?Shawn [name]'s last Vitamin D level was 45.0 mg 02/08/2022. He is on Vitamin D. He denies nausea, vomiting, and muscle weakness.  ? ?3. Anxiety and depression ?Shawn [name] reports stable mood, denies suicidal ideations and homicidal ideations.  ? ?4. Healthcare maintenance ?Routine labs to be monitored- Lipid panel, CMP ? ?5. At risk for heart disease ?Shawn [name] was given approximately 15 minutes of coronary artery disease prevention counseling today. He is 36 y.o. male and has risk factors for heart disease including obesity. We discussed intensive lifestyle modifications today with an emphasis on specific weight loss instructions and strategies. ? ?Repetitive spaced learning was employed today to elicit superior memory formation and behavioral change.   ? ?Assessment/Plan:  ? ?1. Elevated random  blood glucose level ?We will refill Ozempic 1 mg for 1 month with no refills.  ?INCREASE PLAN COMPLIANCE ? ?- Semaglutide, 1 MG/DOSE, 4 MG/3ML SOPN; Inject 1 mg as directed once a week.  Dispense: 3 mL; Refill: 0 ? ?2. Vitamin D deficiency ?Low Vitamin D level contributes to fatigue and are associated with obesity, breast, and colon cancer. We will refill prescription Vitamin D 50,000 IU every week for 1 month with no refills and Shawn [name] will follow-up for routine testing of Vitamin D, at least 2-3 times per year to avoid over-replacement. ? ?- Vitamin D, Ergocalciferol, (DRISDOL) 1.25 MG (50000 UNIT) CAPS capsule; TAKE ONE CAPSULE BY MOUTH ONCE A WEEK FOR 12 WEEKS. THEN TAKE 2000IU/DAY  Dispense: 12 capsule; Refill: 0 ? ?3. Anxiety and depression ?Behavior modification techniques were discussed today to help Shawn [name] deal with his emotional/non-hunger eating behaviors.  We will refill bupropion XL 300 mg for 3 months with no refills. Orders and follow up as documented in patient record.  ? ?- buPROPion (WELLBUTRIN XL) 300 MG 24 hr tablet; Take 1 tablet (300 mg total) by mouth daily.  Dispense: 90 tablet; Refill: 0 ? ?4. Healthcare maintenance ?Check Lipid Panel and CMP ? ?5. At risk for heart disease ?Shawn [name] was given approximately 15 minutes of coronary artery disease prevention counseling today. He is 36 y.o. male and has risk factors for heart disease including obesity. We discussed intensive lifestyle modifications today with an emphasis on specific weight loss instructions and strategies. ? ?Repetitive spaced learning was employed today to elicit superior memory formation and behavioral change.   ? ?6. Obesity,  current BMI 50.1 ?Shawn [name] is currently in the action stage of change. As such, his goal is to continue with weight loss efforts. He has agreed to the Category 4 Plan.  ? ?Exercise goals: Shawn [name] will increase cardiovascular conditioned.  ? ?Behavioral modification strategies: increasing lean  protein intake, decreasing simple carbohydrates, increasing vegetables, increasing water intake, decreasing eating out, no skipping meals, meal planning and cooking strategies, keeping healthy foods in the home, and planning for success. ? ?Shawn [name] has agreed to follow-up with our clinic in 3-4 weeks. He was informed of the importance of frequent follow-up visits to maximize his success with intensive lifestyle modifications for his multiple health conditions.  ? ?Objective:  ? ?Blood pressure 114/60, pulse 99, temperature 98.1 ?F (36.7 ?C), height 5\' 11"  (1.803 m), weight (!) 359 lb (162.8 kg), SpO2 99 %. ?Body mass index is 50.07 kg/m?. ? ?General: Cooperative, alert, well developed, in no acute distress. ?HEENT: Conjunctivae and lids unremarkable. ?Cardiovascular: Regular rhythm.  ?Lungs: Normal work of breathing. ?Neurologic: No focal deficits.  ? ?Lab Results  ?Component Value Date  ? CREATININE 1.18 02/08/2022  ? BUN 13 02/08/2022  ? NA 137 02/08/2022  ? K 4.5 02/08/2022  ? CL 97 02/08/2022  ? CO2 26 02/08/2022  ? ?Lab Results  ?Component Value Date  ? ALT 16 02/08/2022  ? AST 19 02/08/2022  ? ALKPHOS 86 02/08/2022  ? BILITOT 0.2 02/08/2022  ? ?Lab Results  ?Component Value Date  ? HGBA1C 5.3 02/08/2022  ? HGBA1C 5.4 09/30/2021  ? HGBA1C 5.6 06/01/2021  ? HGBA1C 5.5 08/10/2020  ? HGBA1C 5.1 12/11/2012  ? ?Lab Results  ?Component Value Date  ? INSULIN 25.4 (H) 02/08/2022  ? INSULIN 36.4 (H) 09/30/2021  ? INSULIN 138.0 (H) 06/01/2021  ? INSULIN 28.3 (H) 08/10/2020  ? ?Lab Results  ?Component Value Date  ? TSH 1.070 02/08/2022  ? ?Lab Results  ?Component Value Date  ? CHOL 220 (H) 02/08/2022  ? HDL 32 (L) 02/08/2022  ? LDLCALC 170 (H) 02/08/2022  ? LDLDIRECT 141 (H) 12/11/2012  ? TRIG 98 02/08/2022  ? CHOLHDL 6.9 (H) 02/08/2022  ? ?Lab Results  ?Component Value Date  ? VD25OH 45.0 02/08/2022  ? VD25OH 33.9 09/30/2021  ? VD25OH 30.7 06/01/2021  ? ?Lab Results  ?Component Value Date  ? WBC 5.3 08/10/2020  ? HGB  13.8 08/10/2020  ? HCT 41.6 08/10/2020  ? MCV 85 08/10/2020  ? PLT 355 08/10/2020  ? ?No results found for: IRON, TIBC, FERRITIN ? ?Attestation Statements:  ? ?Reviewed by clinician on day of visit: allergies, medications, problem list, medical history, surgical history, family history, social history, and previous encounter notes. ? ?I, 08/12/2020, RMA, am acting as [place] for Energy manager, NP. ? ?I have reviewed the above documentation for accuracy and completeness, and I agree with the above. -  Keagan Brislin d. Harlo Fabela, NP-C ?

## 2022-03-24 DIAGNOSIS — L299 Pruritus, unspecified: Secondary | ICD-10-CM | POA: Diagnosis not present

## 2022-03-24 DIAGNOSIS — L819 Disorder of pigmentation, unspecified: Secondary | ICD-10-CM | POA: Diagnosis not present

## 2022-03-24 DIAGNOSIS — L649 Androgenic alopecia, unspecified: Secondary | ICD-10-CM | POA: Diagnosis not present

## 2022-03-25 DIAGNOSIS — F4320 Adjustment disorder, unspecified: Secondary | ICD-10-CM | POA: Diagnosis not present

## 2022-04-07 ENCOUNTER — Ambulatory Visit (INDEPENDENT_AMBULATORY_CARE_PROVIDER_SITE_OTHER): Payer: Federal, State, Local not specified - PPO | Admitting: Adult Health

## 2022-04-07 ENCOUNTER — Encounter (INDEPENDENT_AMBULATORY_CARE_PROVIDER_SITE_OTHER): Payer: Self-pay | Admitting: Adult Health

## 2022-04-07 VITALS — BP 131/77 | HR 92 | Temp 98.2°F | Ht 71.0 in | Wt 357.0 lb

## 2022-04-07 DIAGNOSIS — E559 Vitamin D deficiency, unspecified: Secondary | ICD-10-CM

## 2022-04-07 DIAGNOSIS — E669 Obesity, unspecified: Secondary | ICD-10-CM | POA: Diagnosis not present

## 2022-04-07 DIAGNOSIS — R7309 Other abnormal glucose: Secondary | ICD-10-CM | POA: Diagnosis not present

## 2022-04-07 DIAGNOSIS — Z9189 Other specified personal risk factors, not elsewhere classified: Secondary | ICD-10-CM

## 2022-04-07 DIAGNOSIS — Z6841 Body Mass Index (BMI) 40.0 and over, adult: Secondary | ICD-10-CM

## 2022-04-07 MED ORDER — SEMAGLUTIDE (1 MG/DOSE) 4 MG/3ML ~~LOC~~ SOPN: 1.0000 mg | PEN_INJECTOR | SUBCUTANEOUS | 0 refills | Status: DC

## 2022-04-14 NOTE — Progress Notes (Signed)
Chief Complaint:   OBESITY Shawn [name] is here to discuss his progress with his obesity treatment plan along with follow-up of his obesity related diagnoses. Shawn [name] is on the Category 4 Plan and states he is following his eating plan approximately 40% of the time. Shawn [name] states he is doing yard work 60-120 minutes 2 times per week.  Today's visit was #: 22 Starting weight: 392 lbs Starting date: 08/10/2020 Today's weight: 357 lbs Today's date: 04/07/2022 Total lbs lost to date: 35 Total lbs lost since last in-office visit: 2  Interim History:  When Shawn [name] is eating "off meal plan" - it is the form of skipping meals or  eating out ([employer], Chic fila, Cookout, [employer] subs).  His girlfriend has been moved in, approximately 4 weeks ago it is going well.  His girlfriend is the one who primarily meal plans and preps for the both of them.  Subjective:   1. Elevated random blood glucose level 2021 CMP with several elevated BG readings >126  2. Vitamin D deficiency On 02/08/22 Shawn [name]'s Vit D level was of 45.0, stable but below goal of 50-70.  He is currently taking ergocalciferol.  He denies any nausea, vomiting or muscle weakness.  3. At risk for constipation Shawn [name] is at increased risk for constipation due to inadequate water intake, changes in diet, and/or use of medications such as GLP1 agonists. Shawn [name] denies hard, infrequent stools currently.   Assessment/Plan:   1. Elevated random blood glucose level We will check labs this summer. We will refill Ozempic 1 mg once weekly for 1 month with no refills.  -Refill Semaglutide, 1 MG/DOSE, 4 MG/3ML SOPN; Inject 1 mg as directed once a week.  Dispense: 3 mL; Refill: 0  2. Vitamin D deficiency We will check Shawn [name]'s labs this summer.  3. At risk for constipation Shawn [name] was given approximately 15 minutes of counseling today regarding prevention of constipation. He was encouraged to increase water and fiber  intake.   4. Obesity, current BMI 49.8 Shawn [name] is currently in the action stage of change. As such, his goal is to continue with weight loss efforts. He has agreed to the Category 4 Plan.   Exercise goals: As is.  Behavioral modification strategies: increasing lean protein intake, decreasing simple carbohydrates, meal planning and cooking strategies, keeping healthy foods in the home, and planning for success.  Shawn [name] has agreed to follow-up with our clinic in 3 weeks. He was informed of the importance of frequent follow-up visits to maximize his success with intensive lifestyle modifications for his multiple health conditions.   Objective:   Blood pressure 131/77, pulse 92, temperature 98.2 F (36.8 C), height 5\' 11"  (1.803 m), weight (!) 357 lb (161.9 kg), SpO2 99 %. Body mass index is 49.79 kg/m.  General: Cooperative, alert, well developed, in no acute distress. HEENT: Conjunctivae and lids unremarkable. Cardiovascular: Regular rhythm.  Lungs: Normal work of breathing. Neurologic: No focal deficits.   Lab Results  Component Value Date   CREATININE 1.18 02/08/2022   BUN 13 02/08/2022   NA 137 02/08/2022   K 4.5 02/08/2022   CL 97 02/08/2022   CO2 26 02/08/2022   Lab Results  Component Value Date   ALT 16 02/08/2022   AST 19 02/08/2022   ALKPHOS 86 02/08/2022   BILITOT 0.2 02/08/2022   Lab Results  Component Value Date   HGBA1C 5.3 02/08/2022   HGBA1C 5.4 09/30/2021   HGBA1C 5.6 06/01/2021   HGBA1C 5.5 08/10/2020  HGBA1C 5.1 12/11/2012   Lab Results  Component Value Date   INSULIN 25.4 (H) 02/08/2022   INSULIN 36.4 (H) 09/30/2021   INSULIN 138.0 (H) 06/01/2021   INSULIN 28.3 (H) 08/10/2020   Lab Results  Component Value Date   TSH 1.070 02/08/2022   Lab Results  Component Value Date   CHOL 220 (H) 02/08/2022   HDL 32 (L) 02/08/2022   LDLCALC 170 (H) 02/08/2022   LDLDIRECT 141 (H) 12/11/2012   TRIG 98 02/08/2022   CHOLHDL 6.9 (H) 02/08/2022    Lab Results  Component Value Date   VD25OH 45.0 02/08/2022   VD25OH 33.9 09/30/2021   VD25OH 30.7 06/01/2021   Lab Results  Component Value Date   WBC 5.3 08/10/2020   HGB 13.8 08/10/2020   HCT 41.6 08/10/2020   MCV 85 08/10/2020   PLT 355 08/10/2020   No results found for: IRON, TIBC, FERRITIN  Attestation Statements:   Reviewed by clinician on day of visit: allergies, medications, problem list, medical history, surgical history, family history, social history, and previous encounter notes.  I, Brendell Tyus, RMA, am acting as transcriptionist for Mina Marble, NP.  I have reviewed the above documentation for accuracy and completeness, and I agree with the above. -  Barlow Harrison d. Modesty Rudy, NP-C

## 2022-04-20 DIAGNOSIS — G4733 Obstructive sleep apnea (adult) (pediatric): Secondary | ICD-10-CM | POA: Diagnosis not present

## 2022-04-22 DIAGNOSIS — F4320 Adjustment disorder, unspecified: Secondary | ICD-10-CM | POA: Diagnosis not present

## 2022-04-22 DIAGNOSIS — M9901 Segmental and somatic dysfunction of cervical region: Secondary | ICD-10-CM | POA: Diagnosis not present

## 2022-04-22 DIAGNOSIS — M9902 Segmental and somatic dysfunction of thoracic region: Secondary | ICD-10-CM | POA: Diagnosis not present

## 2022-04-22 DIAGNOSIS — M9904 Segmental and somatic dysfunction of sacral region: Secondary | ICD-10-CM | POA: Diagnosis not present

## 2022-04-22 DIAGNOSIS — M9903 Segmental and somatic dysfunction of lumbar region: Secondary | ICD-10-CM | POA: Diagnosis not present

## 2022-05-05 ENCOUNTER — Encounter (INDEPENDENT_AMBULATORY_CARE_PROVIDER_SITE_OTHER): Payer: Self-pay | Admitting: Adult Health

## 2022-05-05 ENCOUNTER — Ambulatory Visit (INDEPENDENT_AMBULATORY_CARE_PROVIDER_SITE_OTHER): Payer: Federal, State, Local not specified - PPO | Admitting: Adult Health

## 2022-05-05 VITALS — BP 137/80 | HR 81 | Temp 98.0°F | Ht 71.0 in | Wt 362.0 lb

## 2022-05-05 DIAGNOSIS — Z7985 Long-term (current) use of injectable non-insulin antidiabetic drugs: Secondary | ICD-10-CM

## 2022-05-05 DIAGNOSIS — R7301 Impaired fasting glucose: Secondary | ICD-10-CM

## 2022-05-05 DIAGNOSIS — E669 Obesity, unspecified: Secondary | ICD-10-CM

## 2022-05-05 DIAGNOSIS — Z6841 Body Mass Index (BMI) 40.0 and over, adult: Secondary | ICD-10-CM

## 2022-05-06 DIAGNOSIS — F4320 Adjustment disorder, unspecified: Secondary | ICD-10-CM | POA: Diagnosis not present

## 2022-05-09 NOTE — Progress Notes (Unsigned)
Chief Complaint:   OBESITY Shawn [name] is here to discuss his progress with his obesity treatment plan along with follow-up of his obesity related diagnoses. Shawn [name] is on the Category 4 Plan and states he is following his eating plan approximately 25% of the time. Shawn [name] states he is walking and doing yard work 20-30 minutes 2 times per week.  Today's visit was #: 23 Starting weight: 392 lbs Starting date: 08/10/2020 Today's weight: 362 lbs Today's date: 05/05/2022 Total lbs lost to date: 30 lbs Total lbs lost since last in-office visit: 0  Interim History: Shawn [name] reports eating off meal plan 75% of the time  since last office visit. His goals for summer: 1. Increase daily water intake. 2. 4-5 hours a week of physical activity. 3. By the end of summer down 30 lbs, current weight 362 lbs. Hybrid of Cat 4 and journaling.  Subjective:   1. IFG (impaired fasting glucose) Shawn [name] started on Ozempic 04/06/21, currently on Ozempic 1mg  ***  Assessment/Plan:   1. IFG (impaired fasting glucose) Continue weekly Ozempic 1 mg. No refills needed.  2. Obesity, current BMI 50.6 Shawn [name] is currently in the action stage of change. As such, his goal is to continue with weight loss efforts. He has agreed to the Category 4 Plan and keeping a food journal and adhering to recommended goals of 1800 calories and 125 grams of  protein daily.  Exercise goals: As is.  Behavioral modification strategies: increasing lean protein intake, decreasing simple carbohydrates, keeping healthy foods in the home, ways to avoid boredom eating, better snacking choices, travel eating strategies, and planning for success.  Shawn [name] has agreed to follow-up with our clinic in 4 weeks. He was informed of the importance of frequent follow-up visits to maximize his success with intensive lifestyle modifications for his multiple health conditions.   Objective:   Blood pressure 137/80, pulse 81, temperature 98 F (36.7  C), height 5\' 11"  (1.803 m), weight (!) 362 lb (164.2 kg), SpO2 98 %. Body mass index is 50.49 kg/m.  General: Cooperative, alert, well developed, in no acute distress. HEENT: Conjunctivae and lids unremarkable. Cardiovascular: Regular rhythm.  Lungs: Normal work of breathing. Neurologic: No focal deficits.   Lab Results  Component Value Date   CREATININE 1.18 02/08/2022   BUN 13 02/08/2022   NA 137 02/08/2022   K 4.5 02/08/2022   CL 97 02/08/2022   CO2 26 02/08/2022   Lab Results  Component Value Date   ALT 16 02/08/2022   AST 19 02/08/2022   ALKPHOS 86 02/08/2022   BILITOT 0.2 02/08/2022   Lab Results  Component Value Date   HGBA1C 5.3 02/08/2022   HGBA1C 5.4 09/30/2021   HGBA1C 5.6 06/01/2021   HGBA1C 5.5 08/10/2020   HGBA1C 5.1 12/11/2012   Lab Results  Component Value Date   INSULIN 25.4 (H) 02/08/2022   INSULIN 36.4 (H) 09/30/2021   INSULIN 138.0 (H) 06/01/2021   INSULIN 28.3 (H) 08/10/2020   Lab Results  Component Value Date   TSH 1.070 02/08/2022   Lab Results  Component Value Date   CHOL 220 (H) 02/08/2022   HDL 32 (L) 02/08/2022   LDLCALC 170 (H) 02/08/2022   LDLDIRECT 141 (H) 12/11/2012   TRIG 98 02/08/2022   CHOLHDL 6.9 (H) 02/08/2022   Lab Results  Component Value Date   VD25OH 45.0 02/08/2022   VD25OH 33.9 09/30/2021   VD25OH 30.7 06/01/2021   Lab Results  Component Value Date   WBC 5.3  08/10/2020   HGB 13.8 08/10/2020   HCT 41.6 08/10/2020   MCV 85 08/10/2020   PLT 355 08/10/2020   No results found for: "IRON", "TIBC", "FERRITIN"  Attestation Statements:   Reviewed by clinician on day of visit: allergies, medications, problem list, medical history, surgical history, family history, social history, and previous encounter notes.  Time spent on visit including pre-visit chart review and post-visit care and charting was 28 minutes.   I, Shawn [name], RMA, am acting as transcriptionist for William Hamburger, NP.  I have reviewed the  above documentation for accuracy and completeness, and I agree with the above. -  ***

## 2022-05-10 DIAGNOSIS — R7301 Impaired fasting glucose: Secondary | ICD-10-CM | POA: Insufficient documentation

## 2022-05-15 ENCOUNTER — Other Ambulatory Visit: Payer: Self-pay | Admitting: Family

## 2022-05-15 DIAGNOSIS — K219 Gastro-esophageal reflux disease without esophagitis: Secondary | ICD-10-CM

## 2022-05-17 ENCOUNTER — Other Ambulatory Visit: Payer: Self-pay | Admitting: Family

## 2022-05-17 DIAGNOSIS — K219 Gastro-esophageal reflux disease without esophagitis: Secondary | ICD-10-CM

## 2022-05-20 DIAGNOSIS — G4733 Obstructive sleep apnea (adult) (pediatric): Secondary | ICD-10-CM | POA: Diagnosis not present

## 2022-05-20 DIAGNOSIS — F4320 Adjustment disorder, unspecified: Secondary | ICD-10-CM | POA: Diagnosis not present

## 2022-05-27 ENCOUNTER — Ambulatory Visit: Payer: Federal, State, Local not specified - PPO | Admitting: Family

## 2022-05-27 ENCOUNTER — Encounter: Payer: Self-pay | Admitting: Family

## 2022-05-27 VITALS — BP 130/83 | HR 96 | Temp 98.7°F | Ht 71.0 in | Wt 372.1 lb

## 2022-05-27 DIAGNOSIS — M542 Cervicalgia: Secondary | ICD-10-CM | POA: Diagnosis not present

## 2022-05-27 MED ORDER — PREDNISONE 10 MG PO TABS: ORAL_TABLET | ORAL | 0 refills | Status: DC

## 2022-05-27 NOTE — Patient Instructions (Signed)
It was very nice to see you today!   I will review your lab results via MyChart in a few days.   Have a great rest of the week!   PLEASE NOTE:  If you had any lab tests please let us know if you have not heard back within a few days. You may see your results on MyChart before we have a chance to review them but we will give you a call once they are reviewed by us. If we ordered any referrals today, please let us know if you have not heard from their office within the next week.    
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
## 2022-06-02 DIAGNOSIS — M9903 Segmental and somatic dysfunction of lumbar region: Secondary | ICD-10-CM | POA: Diagnosis not present

## 2022-06-02 DIAGNOSIS — M9902 Segmental and somatic dysfunction of thoracic region: Secondary | ICD-10-CM | POA: Diagnosis not present

## 2022-06-02 DIAGNOSIS — M9904 Segmental and somatic dysfunction of sacral region: Secondary | ICD-10-CM | POA: Diagnosis not present

## 2022-06-02 DIAGNOSIS — M9901 Segmental and somatic dysfunction of cervical region: Secondary | ICD-10-CM | POA: Diagnosis not present

## 2022-06-06 ENCOUNTER — Encounter (INDEPENDENT_AMBULATORY_CARE_PROVIDER_SITE_OTHER): Payer: Self-pay | Admitting: Adult Health

## 2022-06-06 NOTE — Telephone Encounter (Signed)
LVM for patient to call back and r/s appt.  

## 2022-06-09 ENCOUNTER — Ambulatory Visit (INDEPENDENT_AMBULATORY_CARE_PROVIDER_SITE_OTHER): Payer: Federal, State, Local not specified - PPO | Admitting: Adult Health

## 2022-06-09 DIAGNOSIS — F4320 Adjustment disorder, unspecified: Secondary | ICD-10-CM | POA: Diagnosis not present

## 2022-06-15 ENCOUNTER — Other Ambulatory Visit: Payer: Self-pay | Admitting: Family

## 2022-06-15 DIAGNOSIS — K219 Gastro-esophageal reflux disease without esophagitis: Secondary | ICD-10-CM

## 2022-06-16 ENCOUNTER — Encounter (INDEPENDENT_AMBULATORY_CARE_PROVIDER_SITE_OTHER): Payer: Self-pay | Admitting: Adult Health

## 2022-06-16 ENCOUNTER — Ambulatory Visit (INDEPENDENT_AMBULATORY_CARE_PROVIDER_SITE_OTHER): Payer: Federal, State, Local not specified - PPO | Admitting: Adult Health

## 2022-06-16 VITALS — BP 114/81 | HR 98 | Temp 98.3°F | Ht 71.0 in | Wt 366.0 lb

## 2022-06-16 DIAGNOSIS — R7309 Other abnormal glucose: Secondary | ICD-10-CM

## 2022-06-16 DIAGNOSIS — Z6841 Body Mass Index (BMI) 40.0 and over, adult: Secondary | ICD-10-CM | POA: Diagnosis not present

## 2022-06-16 DIAGNOSIS — E559 Vitamin D deficiency, unspecified: Secondary | ICD-10-CM | POA: Diagnosis not present

## 2022-06-16 DIAGNOSIS — E669 Obesity, unspecified: Secondary | ICD-10-CM | POA: Diagnosis not present

## 2022-06-16 DIAGNOSIS — Z9189 Other specified personal risk factors, not elsewhere classified: Secondary | ICD-10-CM

## 2022-06-16 MED ORDER — SEMAGLUTIDE (1 MG/DOSE) 4 MG/3ML ~~LOC~~ SOPN
1.0000 mg | PEN_INJECTOR | SUBCUTANEOUS | 0 refills | Status: DC
Start: 2022-06-16 — End: 2022-07-21
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
## 2022-06-17 ENCOUNTER — Ambulatory Visit (INDEPENDENT_AMBULATORY_CARE_PROVIDER_SITE_OTHER): Payer: Federal, State, Local not specified - PPO | Admitting: Family Medicine

## 2022-06-17 ENCOUNTER — Encounter: Payer: Self-pay | Admitting: Family Medicine

## 2022-06-17 ENCOUNTER — Ambulatory Visit (INDEPENDENT_AMBULATORY_CARE_PROVIDER_SITE_OTHER): Payer: Federal, State, Local not specified - PPO

## 2022-06-17 VITALS — BP 124/84 | HR 76 | Ht 71.0 in | Wt 372.2 lb

## 2022-06-17 DIAGNOSIS — M546 Pain in thoracic spine: Secondary | ICD-10-CM

## 2022-06-17 DIAGNOSIS — M47814 Spondylosis without myelopathy or radiculopathy, thoracic region: Secondary | ICD-10-CM | POA: Diagnosis not present

## 2022-06-17 NOTE — Patient Instructions (Addendum)
Good to see you today.  Please get an Xray today before you leave.  PT to [facility].  Their office will call you to schedule but please let us know if you don't hear from them in one week regarding scheduling.  Follow-up: as needed

## 2022-06-20 DIAGNOSIS — G4733 Obstructive sleep apnea (adult) (pediatric): Secondary | ICD-10-CM | POA: Diagnosis not present
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
## 2022-06-22 NOTE — Progress Notes (Unsigned)
Chief Complaint:   OBESITY Shawn [name] is here to discuss his progress with his obesity treatment plan along with follow-up of his obesity related diagnoses. Shawn [name] is on the Category 4 Plan or keeping a food journal and adhering to recommended goals of 1800 calories and 125 grams of protein daily and states he is following his eating plan approximately 10% of the time. Shawn [name] states he is doing 0 minutes 0 times per week.  Today's visit was #: 24 Starting weight: 392 lbs Starting date: 08/10/2020 Today's weight: 366 lbs Today's date: 06/16/2022 Total lbs lost to date: 26 Total lbs lost since last in-office visit: 0  Interim History: Shawn [name] has been traveling a lot lately: [place], [place], [place], and [place].  He will be going to [place] the week of 07/10/2022.  When he is home, he is overwhelmed with work and has no time to meal plan or prep.  His focus is: Stay hydrated with water and increase protein.  Subjective:   1. Vitamin D deficiency On 02/08/2022, [name]'s vitamin D level was 45.0, stable but yet below goal of 50-70.  2. Elevated random blood glucose level Perez is tolerating Ozempic 1 mg***.  3. At risk for constipation Shawn [name] is at increased risk for constipation due to Ozempic therapy for IFG.  Assessment/Plan:   1. Vitamin D deficiency Shawn [name] will continue Ergo 50,000 units once weekly, no refill needed today.  2. Elevated random blood glucose level We will refill Ozempic 1 mg once weekly for 1 month.  - Semaglutide, 1 MG/DOSE, 4 MG/3ML SOPN; Inject 1 mg as directed once a week.  Dispense: 3 mL; Refill: 0  3. At risk for constipation Shawn [name] was given approximately 15 minutes of counseling today regarding prevention of constipation. He was encouraged to increase water and fiber intake.   4. Obesity, current BMI 51.2 Shawn [name] is currently in the action stage of change. As such, his goal is to continue with weight loss efforts.  He has agreed to the Category 4 Plan and keeping a food journal and adhering to recommended goals of 1800 calories and 125 grams of protein daily.   Exercise goals: All adults should avoid inactivity. Some physical activity is better than none, and adults who participate in any amount of physical activity gain some health benefits.  Behavioral modification strategies: increasing lean protein intake, decreasing simple carbohydrates, decreasing eating out, meal planning and cooking strategies, keeping healthy foods in the home, travel eating strategies, and planning for success.  Shawn [name] has agreed to follow-up with our clinic in 4 weeks. He was informed of the importance of frequent follow-up visits to maximize his success with intensive lifestyle modifications for his multiple health conditions.   Objective:   Blood pressure 114/81, pulse 98, temperature 98.3 F (36.8 C), height 5\' 11"  (1.803 m), weight (!) 366 lb (166 kg), SpO2 98 %. Body mass index is 51.05 kg/m.  General: Cooperative, alert, well developed, in no acute distress. HEENT: Conjunctivae and lids unremarkable. Cardiovascular: Regular rhythm.  Lungs: Normal work of breathing. Neurologic: No focal deficits.   Lab Results  Component Value Date   CREATININE 1.18 02/08/2022   BUN 13 02/08/2022   NA 137 02/08/2022   K 4.5 02/08/2022   CL 97 02/08/2022   CO2 26 02/08/2022   Lab Results  Component Value Date   ALT 16 02/08/2022   AST 19 02/08/2022   ALKPHOS 86 02/08/2022   BILITOT 0.2 02/08/2022   Lab Results  Component  Value Date   HGBA1C 5.3 02/08/2022   HGBA1C 5.4 09/30/2021   HGBA1C 5.6 06/01/2021   HGBA1C 5.5 08/10/2020   HGBA1C 5.1 12/11/2012   Lab Results  Component Value Date   INSULIN 25.4 (H) 02/08/2022   INSULIN 36.4 (H) 09/30/2021   INSULIN 138.0 (H) 06/01/2021   INSULIN 28.3 (H) 08/10/2020   Lab Results  Component Value Date   TSH 1.070 02/08/2022   Lab Results  Component Value Date   CHOL  220 (H) 02/08/2022   HDL 32 (L) 02/08/2022   LDLCALC 170 (H) 02/08/2022   LDLDIRECT 141 (H) 12/11/2012   TRIG 98 02/08/2022   CHOLHDL 6.9 (H) 02/08/2022   Lab Results  Component Value Date   VD25OH 45.0 02/08/2022   VD25OH 33.9 09/30/2021   VD25OH 30.7 06/01/2021   Lab Results  Component Value Date   WBC 5.3 08/10/2020   HGB 13.8 08/10/2020   HCT 41.6 08/10/2020   MCV 85 08/10/2020   PLT 355 08/10/2020   No results found for: "IRON", "TIBC", "FERRITIN"  Attestation Statements:   Reviewed by clinician on day of visit: allergies, medications, problem list, medical history, surgical history, family history, social history, and previous encounter notes.   Trude Mcburney, am acting as transcriptionist for William Hamburger, NP.  I have reviewed the above documentation for accuracy and completeness, and I agree with the above. -  ***

## 2022-06-29 ENCOUNTER — Encounter (INDEPENDENT_AMBULATORY_CARE_PROVIDER_SITE_OTHER): Payer: Self-pay

## 2022-07-01 DIAGNOSIS — F4320 Adjustment disorder, unspecified: Secondary | ICD-10-CM | POA: Diagnosis not present

## 2022-07-07 ENCOUNTER — Ambulatory Visit: Payer: Federal, State, Local not specified - PPO | Admitting: Physical Therapy

## 2022-07-07 DIAGNOSIS — M62838 Other muscle spasm: Secondary | ICD-10-CM | POA: Diagnosis not present

## 2022-07-07 DIAGNOSIS — M546 Pain in thoracic spine: Secondary | ICD-10-CM | POA: Diagnosis not present
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
## 2022-07-10 ENCOUNTER — Encounter: Payer: Self-pay | Admitting: Physical Therapy

## 2022-07-12 ENCOUNTER — Other Ambulatory Visit (INDEPENDENT_AMBULATORY_CARE_PROVIDER_SITE_OTHER): Payer: Self-pay | Admitting: Adult Health

## 2022-07-12 ENCOUNTER — Other Ambulatory Visit: Payer: Self-pay | Admitting: Family

## 2022-07-12 DIAGNOSIS — K219 Gastro-esophageal reflux disease without esophagitis: Secondary | ICD-10-CM

## 2022-07-12 DIAGNOSIS — F32A Depression, unspecified: Secondary | ICD-10-CM

## 2022-07-21 ENCOUNTER — Ambulatory Visit (INDEPENDENT_AMBULATORY_CARE_PROVIDER_SITE_OTHER): Payer: Federal, State, Local not specified - PPO | Admitting: Adult Health

## 2022-07-21 ENCOUNTER — Encounter (INDEPENDENT_AMBULATORY_CARE_PROVIDER_SITE_OTHER): Payer: Self-pay | Admitting: Adult Health

## 2022-07-21 VITALS — BP 149/112 | HR 70 | Temp 97.9°F | Ht 71.0 in | Wt 373.0 lb

## 2022-07-21 DIAGNOSIS — R7309 Other abnormal glucose: Secondary | ICD-10-CM | POA: Diagnosis not present

## 2022-07-21 DIAGNOSIS — I10 Essential (primary) hypertension: Secondary | ICD-10-CM | POA: Diagnosis not present

## 2022-07-21 DIAGNOSIS — E559 Vitamin D deficiency, unspecified: Secondary | ICD-10-CM

## 2022-07-21 DIAGNOSIS — Z6841 Body Mass Index (BMI) 40.0 and over, adult: Secondary | ICD-10-CM

## 2022-07-21 DIAGNOSIS — E669 Obesity, unspecified: Secondary | ICD-10-CM

## 2022-07-21 DIAGNOSIS — Z9189 Other specified personal risk factors, not elsewhere classified: Secondary | ICD-10-CM

## 2022-07-21 MED ORDER — SEMAGLUTIDE (1 MG/DOSE) 4 MG/3ML ~~LOC~~ SOPN: 1.0000 mg | PEN_INJECTOR | SUBCUTANEOUS | 0 refills | Status: DC

## 2022-07-22 DIAGNOSIS — G4733 Obstructive sleep apnea (adult) (pediatric): Secondary | ICD-10-CM | POA: Diagnosis not present

## 2022-07-22 LAB — COMPREHENSIVE METABOLIC PANEL
ALT: 24 IU/L (ref 0–44)
AST: 22 IU/L (ref 0–40)
Albumin/Globulin Ratio: 1.5 (ref 1.2–2.2)
Albumin: 4 g/dL — ABNORMAL LOW (ref 4.1–5.1)
Alkaline Phosphatase: 86 IU/L (ref 44–121)
BUN/Creatinine Ratio: 11 (ref 9–20)
BUN: 12 mg/dL (ref 6–20)
Bilirubin Total: <0.2 mg/dL (ref 0.0–1.2)
CO2: 25 mmol/L (ref 20–29)
Calcium: 8.9 mg/dL (ref 8.7–10.2)
Chloride: 103 mmol/L (ref 96–106)
Creatinine, Ser: 1.13 mg/dL (ref 0.76–1.27)
Globulin, Total: 2.6 g/dL (ref 1.5–4.5)
Glucose: 83 mg/dL (ref 70–99)
Potassium: 4.7 mmol/L (ref 3.5–5.2)
Sodium: 142 mmol/L (ref 134–144)
Total Protein: 6.6 g/dL (ref 6.0–8.5)
eGFR: 87 mL/min/{1.73_m2} (ref >59)

## 2022-07-22 LAB — HEMOGLOBIN A1C
Est. average glucose Bld gHb Est-mCnc: 111 mg/dL
Hgb A1c MFr Bld: 5.5 % (ref 4.8–5.6)

## 2022-07-22 LAB — VITAMIN D 25 HYDROXY (VIT D DEFICIENCY, FRACTURES): Vit D, 25-Hydroxy: 32.8 ng/mL (ref 30.0–100.0)

## 2022-07-22 LAB — INSULIN, RANDOM: INSULIN: 12.8 u[IU]/mL (ref 2.6–24.9)

## 2022-07-24 DIAGNOSIS — Z9189 Other specified personal risk factors, not elsewhere classified: Secondary | ICD-10-CM | POA: Insufficient documentation

## 2022-07-24 NOTE — Progress Notes (Signed)
Chief Complaint:   OBESITY Shawn [name] is here to discuss his progress with his obesity treatment plan along with follow-up of his obesity related diagnoses. Shawn [name] is on Category 4 Plan and keeping a food journal and adhering to recommended goals of 1800 calories and 125 grams of protein daily and states he is following his eating plan approximately 0% of the time. Shawn [name] states he is walking and has been on vacation 30 minutes 3 times per week.  Today's visit was #: 25 Starting weight: 392 lbs Starting date: 08/10/2020 Today's weight: 373 lbs Today's date: 07/21/2022 Total lbs lost to date: 19 lbs Total lbs lost since last in-office visit: +7 lbs  Interim History:  He travelled to the [place]all inclusive 6 days at a resort.   He has been off his antihypertensive therapy for a few days.   He denies acute cardiac symptoms at present.   He and his live in girlfriend both joined [employer].   Subjective:   1. Essential hypertension Been on vacation and off antihypertensive therapy for several days.   Blood pressure above goat at office visit.   He denies acute cardiac symptoms at present.    2. Elevated random blood glucose level Started on Wegovy 0.25 mg 04/06/2022.   Currently on Ozempic 1 mg once weekly injection. He denies medication SE.  3. Vitamin D deficiency He was in the [place] for 6 days recently.  He is on once weekly Ergocalciferol.     4. At risk for heart disease Sammuel is at higher than average risk for cardiovascular disease due to obesity and hypertension.   Assessment/Plan:   1. Essential hypertension Take antihypertensive medication daily.  Check blood pressures at home.  Check labs.   - Comprehensive metabolic panel  2. Elevated random blood glucose level Refill - Semaglutide, 1 MG/DOSE, 4 MG/3ML SOPN; Inject 1 mg as directed once a week.   Dispense: 3 mL; Refill: 0  Check labs  - Hemoglobin A1c - Insulin, random  3.  Vitamin D deficiency Check labs.  - VITAMIN D 25 Hydroxy (Vit-D Deficiency, Fractures)  4. At risk for heart disease Shawn [name] was given approximately 15 minutes of coronary artery disease prevention counseling today. He is 36 y.o. male and has risk factors for heart disease including obesity. We discussed intensive lifestyle modifications today with an emphasis on specific weight loss instructions and strategies.  Repetitive spaced learning was employed today to elicit superior memory formation and behavioral change.    5. Obesity, current BMI 52.1 Follow up with PCP for hyperlipidemia.   Shawn [name] is currently in the action stage of change. As such, his goal is to continue with weight loss efforts. He has agreed to the Category 4 Plan.   Exercise goals:  as is.   Behavioral modification strategies: increasing lean protein intake, decreasing simple carbohydrates, meal planning and cooking strategies, keeping healthy foods in the home, and planning for success.  Shawn [name] has agreed to follow-up with our clinic in 4 weeks. He was informed of the importance of frequent follow-up visits to maximize his success with intensive lifestyle modifications for his multiple health conditions.   Shawn [name] was informed we would discuss his lab results at his next visit unless there is a critical issue that needs to be addressed sooner. Shawn [name] agreed to keep his next visit at the agreed upon time to discuss these results.  Objective:   Blood pressure (!) 149/112, pulse 70, temperature 97.9 F (36.6 C),  height 5\' 11"  (1.803 m), weight (!) 373 lb (169.2 kg), SpO2 97 %. Body mass index is 52.02 kg/m.  General: Cooperative, alert, well developed, in no acute distress. HEENT: Conjunctivae and lids unremarkable. Cardiovascular: Regular rhythm.  Lungs: Normal work of breathing. Neurologic: No focal deficits.   Lab Results  Component Value Date   CREATININE 1.13 07/21/2022   BUN 12 07/21/2022   NA  142 07/21/2022   K 4.7 07/21/2022   CL 103 07/21/2022   CO2 25 07/21/2022   Lab Results  Component Value Date   ALT 24 07/21/2022   AST 22 07/21/2022   ALKPHOS 86 07/21/2022   BILITOT <0.2 07/21/2022   Lab Results  Component Value Date   HGBA1C 5.5 07/21/2022   HGBA1C 5.3 02/08/2022   HGBA1C 5.4 09/30/2021   HGBA1C 5.6 06/01/2021   HGBA1C 5.5 08/10/2020   Lab Results  Component Value Date   INSULIN 12.8 07/21/2022   INSULIN 25.4 (H) 02/08/2022   INSULIN 36.4 (H) 09/30/2021   INSULIN 138.0 (H) 06/01/2021   INSULIN 28.3 (H) 08/10/2020   Lab Results  Component Value Date   TSH 1.070 02/08/2022   Lab Results  Component Value Date   CHOL 220 (H) 02/08/2022   HDL 32 (L) 02/08/2022   LDLCALC 170 (H) 02/08/2022   LDLDIRECT 141 (H) 12/11/2012   TRIG 98 02/08/2022   CHOLHDL 6.9 (H) 02/08/2022   Lab Results  Component Value Date   VD25OH 32.8 07/21/2022   VD25OH 45.0 02/08/2022   VD25OH 33.9 09/30/2021   Lab Results  Component Value Date   WBC 5.3 08/10/2020   HGB 13.8 08/10/2020   HCT 41.6 08/10/2020   MCV 85 08/10/2020   PLT 355 08/10/2020   No results found for: "IRON", "TIBC", "FERRITIN"  Attestation Statements:   Reviewed by clinician on day of visit: allergies, medications, problem list, medical history, surgical history, family history, social history, and previous encounter notes.  I, 08/12/2020, RMA, am acting as [employer] for Energy manager, NP.  I have reviewed the above documentation for accuracy and completeness, and I agree with the above. -  Sanjit Mcmichael d. Hayli Milligan, NP-C

## 2022-07-27 ENCOUNTER — Encounter: Payer: Federal, State, Local not specified - PPO | Admitting: Physical Therapy

## 2022-07-27 DIAGNOSIS — M9903 Segmental and somatic dysfunction of lumbar region: Secondary | ICD-10-CM | POA: Diagnosis not present

## 2022-07-27 DIAGNOSIS — M9901 Segmental and somatic dysfunction of cervical region: Secondary | ICD-10-CM | POA: Diagnosis not present

## 2022-07-27 DIAGNOSIS — M9904 Segmental and somatic dysfunction of sacral region: Secondary | ICD-10-CM | POA: Diagnosis not present

## 2022-07-27 DIAGNOSIS — M9902 Segmental and somatic dysfunction of thoracic region: Secondary | ICD-10-CM | POA: Diagnosis not present

## 2022-07-28 DIAGNOSIS — F4320 Adjustment disorder, unspecified: Secondary | ICD-10-CM | POA: Diagnosis not present

## 2022-08-02 ENCOUNTER — Encounter: Payer: Self-pay | Admitting: Family

## 2022-08-02 DIAGNOSIS — L509 Urticaria, unspecified: Secondary | ICD-10-CM

## 2022-08-04 ENCOUNTER — Encounter: Payer: Self-pay | Admitting: Physical Therapy

## 2022-08-04 ENCOUNTER — Ambulatory Visit: Payer: Federal, State, Local not specified - PPO | Admitting: Physical Therapy

## 2022-08-04 DIAGNOSIS — M62838 Other muscle spasm: Secondary | ICD-10-CM | POA: Diagnosis not present

## 2022-08-04 DIAGNOSIS — M546 Pain in thoracic spine: Secondary | ICD-10-CM
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
## 2022-08-08 ENCOUNTER — Other Ambulatory Visit: Payer: Self-pay | Admitting: Family

## 2022-08-08 DIAGNOSIS — K219 Gastro-esophageal reflux disease without esophagitis: Secondary | ICD-10-CM

## 2022-08-11 ENCOUNTER — Encounter: Payer: Federal, State, Local not specified - PPO | Admitting: Physical Therapy

## 2022-08-12 DIAGNOSIS — F4320 Adjustment disorder, unspecified: Secondary | ICD-10-CM | POA: Diagnosis not present

## 2022-08-15 ENCOUNTER — Encounter: Payer: Self-pay | Admitting: *Deleted

## 2022-08-18 ENCOUNTER — Ambulatory Visit: Payer: Federal, State, Local not specified - PPO | Admitting: Physical Therapy

## 2022-08-18 ENCOUNTER — Ambulatory Visit (INDEPENDENT_AMBULATORY_CARE_PROVIDER_SITE_OTHER): Payer: Federal, State, Local not specified - PPO | Admitting: Adult Health

## 2022-08-18 ENCOUNTER — Other Ambulatory Visit: Payer: Self-pay | Admitting: Family

## 2022-08-18 ENCOUNTER — Other Ambulatory Visit: Payer: Self-pay | Admitting: Family Medicine

## 2022-08-18 DIAGNOSIS — M546 Pain in thoracic spine: Secondary | ICD-10-CM

## 2022-08-18 DIAGNOSIS — M62838 Other muscle spasm: Secondary | ICD-10-CM | POA: Diagnosis not present
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
## 2022-08-19 ENCOUNTER — Encounter: Payer: Self-pay | Admitting: Physical Therapy

## 2022-08-19 ENCOUNTER — Ambulatory Visit: Payer: Federal, State, Local not specified - PPO | Admitting: Allergy

## 2022-08-21 DIAGNOSIS — G4733 Obstructive sleep apnea (adult) (pediatric): Secondary | ICD-10-CM | POA: Diagnosis not present

## 2022-08-24 ENCOUNTER — Ambulatory Visit (INDEPENDENT_AMBULATORY_CARE_PROVIDER_SITE_OTHER): Payer: Federal, State, Local not specified - PPO | Admitting: Family Medicine

## 2022-08-25 ENCOUNTER — Other Ambulatory Visit (INDEPENDENT_AMBULATORY_CARE_PROVIDER_SITE_OTHER): Payer: Self-pay | Admitting: Adult Health

## 2022-08-25 DIAGNOSIS — R7309 Other abnormal glucose: Secondary | ICD-10-CM

## 2022-08-25 DIAGNOSIS — E559 Vitamin D deficiency, unspecified: Secondary | ICD-10-CM

## 2022-08-25 MED ORDER — VITAMIN D (ERGOCALCIFEROL) 1.25 MG (50000 UNIT) PO CAPS: ORAL_CAPSULE | ORAL | 0 refills | Status: DC

## 2022-08-25 MED ORDER — SEMAGLUTIDE (1 MG/DOSE) 4 MG/3ML ~~LOC~~ SOPN: 1.0000 mg | PEN_INJECTOR | SUBCUTANEOUS | 0 refills | Status: DC

## 2022-08-25 NOTE — Telephone Encounter (Signed)
LAST APPOINTMENT DATE: 07/21/22 NEXT APPOINTMENT DATE: 09/22/22   [id number] [place], [place] - [address] [place] [address] Phone: [phone] Fax: [phone]  [place], [place] [address] Phone: [phone] Fax: [phone]  Patient is requesting a refill of the following medications: Requested Prescriptions   Pending Prescriptions Disp Refills   Vitamin D, Ergocalciferol, (DRISDOL) 1.25 MG (50000 UNIT) CAPS capsule 12 capsule 0    Sig: TAKE ONE CAPSULE BY MOUTH ONCE A WEEK FOR 12 WEEKS. THEN TAKE 2000IU/DAY   Semaglutide, 1 MG/DOSE, 4 MG/3ML SOPN 3 mL 0    Sig: Inject 1 mg as directed once a week.    Date last filled: 03/14/2022 Previously prescribed by Mina Marble  Lab Results  Component Value Date   HGBA1C 5.5 07/21/2022   HGBA1C 5.3 02/08/2022   HGBA1C 5.4 09/30/2021   Lab Results  Component Value Date   LDLCALC 170 (H) 02/08/2022   CREATININE 1.13 07/21/2022   Lab Results  Component Value Date   VD25OH 32.8 07/21/2022   VD25OH 45.0 02/08/2022   VD25OH 33.9 09/30/2021    BP Readings from Last 3 Encounters:  07/21/22 (!) 149/112  06/17/22 124/84  06/16/22 114/81

## 2022-08-26 DIAGNOSIS — M9902 Segmental and somatic dysfunction of thoracic region: Secondary | ICD-10-CM | POA: Diagnosis not present

## 2022-08-26 DIAGNOSIS — M9904 Segmental and somatic dysfunction of sacral region: Secondary | ICD-10-CM | POA: Diagnosis not present

## 2022-08-26 DIAGNOSIS — M9903 Segmental and somatic dysfunction of lumbar region: Secondary | ICD-10-CM | POA: Diagnosis not present

## 2022-08-26 DIAGNOSIS — M9901 Segmental and somatic dysfunction of cervical region: Secondary | ICD-10-CM | POA: Diagnosis not present

## 2022-08-28 ENCOUNTER — Other Ambulatory Visit: Payer: Self-pay | Admitting: Family

## 2022-08-28 ENCOUNTER — Other Ambulatory Visit (INDEPENDENT_AMBULATORY_CARE_PROVIDER_SITE_OTHER): Payer: Self-pay | Admitting: Adult Health

## 2022-08-28 ENCOUNTER — Encounter: Payer: Self-pay | Admitting: Family

## 2022-08-28 DIAGNOSIS — K219 Gastro-esophageal reflux disease without esophagitis: Secondary | ICD-10-CM

## 2022-08-28 DIAGNOSIS — F419 Anxiety disorder, unspecified: Secondary | ICD-10-CM

## 2022-08-29 ENCOUNTER — Other Ambulatory Visit: Payer: Self-pay

## 2022-08-29 DIAGNOSIS — I10 Essential (primary) hypertension: Secondary | ICD-10-CM

## 2022-08-29 MED ORDER — HYDROCHLOROTHIAZIDE 25 MG PO TABS: 25.0000 mg | ORAL_TABLET | Freq: Every day | ORAL | 3 refills | Status: DC

## 2022-08-29 MED ORDER — OMEPRAZOLE 40 MG PO CPDR: 40.0000 mg | DELAYED_RELEASE_CAPSULE | Freq: Every day | ORAL | 0 refills | Status: DC

## 2022-09-13 ENCOUNTER — Other Ambulatory Visit: Payer: Self-pay | Admitting: Family

## 2022-09-13 DIAGNOSIS — K219 Gastro-esophageal reflux disease without esophagitis: Secondary | ICD-10-CM

## 2022-09-15 DIAGNOSIS — M9904 Segmental and somatic dysfunction of sacral region: Secondary | ICD-10-CM | POA: Diagnosis not present

## 2022-09-15 DIAGNOSIS — M9902 Segmental and somatic dysfunction of thoracic region: Secondary | ICD-10-CM | POA: Diagnosis not present

## 2022-09-15 DIAGNOSIS — M9903 Segmental and somatic dysfunction of lumbar region: Secondary | ICD-10-CM | POA: Diagnosis not present

## 2022-09-15 DIAGNOSIS — M9901 Segmental and somatic dysfunction of cervical region: Secondary | ICD-10-CM | POA: Diagnosis not present

## 2022-09-21 DIAGNOSIS — G4733 Obstructive sleep apnea (adult) (pediatric): Secondary | ICD-10-CM | POA: Diagnosis not present

## 2022-09-22 ENCOUNTER — Ambulatory Visit (INDEPENDENT_AMBULATORY_CARE_PROVIDER_SITE_OTHER): Payer: Federal, State, Local not specified - PPO | Admitting: Allergy

## 2022-09-22 ENCOUNTER — Encounter: Payer: Self-pay | Admitting: Allergy

## 2022-09-22 VITALS — BP 160/96 | HR 98 | Temp 98.2°F | Resp 12 | Ht 70.28 in | Wt 377.4 lb

## 2022-09-22 DIAGNOSIS — L508 Other urticaria: Secondary | ICD-10-CM

## 2022-09-22 MED ORDER — EPINEPHRINE 0.3 MG/0.3ML IJ SOAJ: 0.3000 mg | INTRAMUSCULAR | 1 refills | Status: AC | PRN

## 2022-09-22 NOTE — Patient Instructions (Addendum)
Chronic hives - at this time etiology of hives is unknown however concerned that you may have cold-induced hives which is usually triggered by exposure in cool/cold bodies of water.  Cold-induced hives can lead to anaphylaxis and thus having epipen is important.  Hives can be caused by a variety of different triggers including illness/infection, foods, medications, stings, exercise, pressure, vibrations, extremes of temperature to name a few however majority of the time there is no identifiable trigger.  Your symptoms have been ongoing for >6 weeks making this chronic thus will obtain labwork to evaluate: CBC w diff, tryptase, hive panel, environmental panel, alpha-gal panel - in anticipation of summer and swimming would start antihistamine regimen around May with Zyrtec 1 tab 1-2 times a day with Pepcid 1 tab 1-2 times a day depending on symptoms.  - if hives are not controlled with Zyrtec and Pepcid at twice a day dosing then would recommend starting Xolair monthly injections for added control.  Will discuss this option in more detail if needed - recommend you have access to epipen in case of more severe reaction with hives.  Emergency action plan provided to follow in case of allergic reaction.    Follow-up next summer 2024

## 2022-09-22 NOTE — Progress Notes (Signed)
New Patient Note  RE: Shawn [name] MRN: [id number] DOB: [birth year] Date of Office Visit: 09/22/2022   Primary care provider: Jeanie Sewer, NP  Chief Complaint: hives  History of present illness: Shawn [name] is a 36 y.o. male presenting today for evaluation of hives.   He states he started having hives couple years ago that randomly popped up.  Normally hives occur more in the summer when he is at the pool or the beach swimming.  He has started to wear compression shirts to see if this would help but doesn't really.  He states he usually gets in the water at the pool or beach.  The water is usually cool to cold.  He states he is outside often in the heat and plays outdoor sports and does not have the hives at these times.  He will use benadryl and use a cool rag to help relieve hive symptoms.  Most recently [place] and states later in the day he was in the pool that was warmer.  He states he has not noted hives with cold showers.  He has not identified any foods that trigger hives.  He denies any swelling.  Denies any preceding illness.  No concern for bites/stings.  No change in detergents, soaps, lotion.  No fevers, joint aches/pains.  He provided pictures of the rash that is consistent with urticaria.   No history of asthma, rhinoconjunctivitis, food allergy or eczema.   Review of systems in the past 4 weeks: Review of Systems  Constitutional: Negative.   HENT: Negative.    Eyes: Negative.   Respiratory: Negative.    Cardiovascular: Negative.   Musculoskeletal: Negative.   Skin: Negative.   Allergic/Immunologic: Negative.   Neurological: Negative.     All other systems negative unless noted above in HPI  Past medical history: Past Medical History:  Diagnosis Date   Allergy    Chest pain    Depression, recurrent ([facility]) 01/28/2021   Eczema    Encounter for assessment of STD exposure 09/10/2021   Multiple fractures of ribs,  left side, initial encounter for closed fracture 02/04/2020   Pain in both feet    Right foot pain 08/24/2021   Shoulder pain    Urticaria     Past surgical history: History reviewed. No pertinent surgical history.  Family history:  Family History  Problem Relation Age of Onset   Allergic rhinitis Mother    Arthritis Mother    Sleep apnea Mother    Obesity Mother    Diabetes Maternal Aunt    Hypertension Maternal Aunt    Diabetes Maternal Uncle    Cataracts Maternal Grandfather     Social history: Lives in a home with carpeting in the bedroom with gas heating and central cooling.  No pets in the home.  No concern for water damage, mildew or roaches in the home.  He is a Geophysicist/field seismologist.  Denies a smoking history.    Medication List: Current Outpatient Medications  Medication Sig Dispense Refill   Acetaminophen (TYLENOL PO) Take by mouth.     AMBULATORY NON FORMULARY MEDICATION Massage therapy to manage chronic back pain.  Treat as needed 1 each 0   cyclobenzaprine (FLEXERIL) 10 MG tablet Take 1 tablet (10 mg total) by mouth 3 (three) times daily as needed for muscle spasms. 90 tablet 2   finasteride (PROPECIA) 1 MG tablet Take 1 mg by mouth daily.  hydrochlorothiazide (HYDRODIURIL) 25 MG tablet Take 1 tablet (25 mg total) by mouth daily. 90 tablet 3   hydrocortisone 2.5 % cream Apply topically.     ketoconazole (NIZORAL) 2 % shampoo Apply topically 2 (two) times a week.     lidocaine (LIDODERM) 5 % Place 1 patch onto the skin daily. Remove & Discard patch within 12 hours or as directed by MD 30 patch 12   lisinopril (ZESTRIL) 40 MG tablet TAKE 1 TABLET BY MOUTH EVERY DAY 90 tablet 1   meloxicam (MOBIC) 7.5 MG tablet Take 1-2 tablets (7.5-15 mg total) by mouth daily. Or can take 1 pill twice day. 60 tablet 2   minoxidil (LONITEN) 2.5 MG tablet Take 2.5 mg by mouth daily.     omeprazole (PRILOSEC) 40 MG capsule Take 1 capsule (40 mg total) by mouth daily. 30 capsule 0    Semaglutide, 1 MG/DOSE, 4 MG/3ML SOPN Inject 1 mg as directed once a week. 3 mL 0   Vitamin D, Ergocalciferol, (DRISDOL) 1.25 MG (50000 UNIT) CAPS capsule TAKE ONE CAPSULE BY MOUTH ONCE A WEEK FOR 12 WEEKS. THEN TAKE 2000IU/DAY 12 capsule 0   buPROPion (WELLBUTRIN XL) 300 MG 24 hr tablet Take 1 tablet (300 mg total) by mouth daily. (Patient not taking: Reported on 09/22/2022) 90 tablet 0   traZODone (DESYREL) 50 MG tablet TAKE 1/2 TO 1 TABLET BY MOUTH AT BEDTIME AS NEEDED FOR SLEEP (Patient not taking: Reported on 09/22/2022) 90 tablet 2   No current facility-administered medications for this visit.    Known medication allergies: No Known Allergies   Physical examination: Blood pressure (!) 160/96, pulse 98, temperature 98.2 F (36.8 C), temperature source Temporal, resp. rate 12, height 5' 10.28" (1.785 m), weight (!) 377 lb 6.4 oz (171.2 kg), SpO2 98 %.  General: Alert, interactive, in no acute distress. HEENT: PERRLA, TMs pearly gray, turbinates non-edematous without discharge, post-pharynx non erythematous. Neck: Supple without lymphadenopathy. Lungs: Clear to auscultation without wheezing, rhonchi or rales. {no increased work of breathing. CV: Normal S1, S2 without murmurs. Abdomen: Nondistended, nontender. Skin: Warm and dry, without lesions or rashes. Extremities:  No clubbing, cyanosis or edema. Neuro:   Grossly intact.  Diagnositics/Labs reviewed: Labs:  Component     Latest Ref Rng 07/21/2022  Glucose     70 - 99 mg/dL 83   BUN     6 - 20 mg/dL 12   Creatinine     0.76 - 1.27 mg/dL 1.13   eGFR     >59 mL/min/1.73 87   BUN/Creatinine Ratio     9 - 20  11   Sodium     134 - 144 mmol/L 142   Potassium     3.5 - 5.2 mmol/L 4.7   Chloride     96 - 106 mmol/L 103   CO2     20 - 29 mmol/L 25   Calcium     8.7 - 10.2 mg/dL 8.9   Total Protein     6.0 - 8.5 g/dL 6.6   Albumin     4.1 - 5.1 g/dL 4.0 (L)   Globulin, Total     1.5 - 4.5 g/dL 2.6   Albumin/Globulin  Ratio     1.2 - 2.2  1.5   Total Bilirubin     0.0 - 1.2 mg/dL <0.2   Alkaline Phosphatase     44 - 121 IU/L 86   AST     0 - 40 IU/L 22  ALT     0 - 44 IU/L 24     Component     Latest Ref Rng 02/08/2022  TSH     0.450 - 4.500 uIU/mL 1.070   Triiodothyronine,Free,Serum     2.0 - 4.4 pg/mL 2.9   T4,Free(Direct)     0.82 - 1.77 ng/dL 1.22      Assessment and plan:   Chronic hives - at this time etiology of hives is unknown however concerned that you may have cold-induced hives which is usually triggered by exposure in cool/cold bodies of water.  Cold-induced hives can lead to anaphylaxis and thus having epipen is important.  Hives can be caused by a variety of different triggers including illness/infection, foods, medications, stings, exercise, pressure, vibrations, extremes of temperature to name a few however majority of the time there is no identifiable trigger.  Your symptoms have been ongoing for >6 weeks making this chronic thus will obtain labwork to evaluate: CBC w diff, tryptase, hive panel, environmental panel, alpha-gal panel - in anticipation of summer and swimming would start antihistamine regimen around May with Zyrtec 1 tab 1-2 times a day with Pepcid 1 tab 1-2 times a day depending on symptoms.  - if hives are not controlled with Zyrtec and Pepcid at twice a day dosing then would recommend starting Xolair monthly injections for added control.  Will discuss this option in more detail if needed - recommend you have access to epipen in case of more severe reaction with hives.  Emergency action plan provided to follow in case of allergic reaction.    Follow-up next summer 2024  I appreciate the opportunity to take part in [place] care. Please do not hesitate to contact me with questions.  Sincerely,   Prudy Feeler, MD Allergy/Immunology [facility]

## 2022-09-27 ENCOUNTER — Encounter: Payer: Self-pay | Admitting: Allergy

## 2022-09-30 LAB — ALLERGENS W/TOTAL IGE AREA 2

## 2022-09-30 LAB — CBC WITH DIFFERENTIAL
Basophils Absolute: 0 10*3/uL (ref 0.0–0.2)
Basos: 1 %
EOS (ABSOLUTE): 0.2 10*3/uL (ref 0.0–0.4)
Eos: 3 %
Hematocrit: 42.9 % (ref 37.5–51.0)
Hemoglobin: 14.1 g/dL (ref 13.0–17.7)
Immature Grans (Abs): 0 10*3/uL (ref 0.0–0.1)
Immature Granulocytes: 1 %
Lymphocytes Absolute: 2.2 10*3/uL (ref 0.7–3.1)
Lymphs: 37 %
MCH: 27.3 pg (ref 26.6–33.0)
MCHC: 32.9 g/dL (ref 31.5–35.7)
MCV: 83 fL (ref 79–97)
Monocytes Absolute: 0.5 10*3/uL (ref 0.1–0.9)
Monocytes: 8 %
Neutrophils Absolute: 3.1 10*3/uL (ref 1.4–7.0)
Neutrophils: 50 %
RBC: 5.16 x10E6/uL (ref 4.14–5.80)
RDW: 13.5 % (ref 11.6–15.4)
WBC: 6 10*3/uL (ref 3.4–10.8)

## 2022-09-30 LAB — TRYPTASE: Tryptase: 8.9 ug/L (ref 2.2–13.2)

## 2022-09-30 LAB — CHRONIC URTICARIA: cu index: <1 (ref <10)

## 2022-09-30 LAB — ALPHA-GAL PANEL
Allergen Lamb IgE: <0.1 kU/L
Beef IgE: <0.1 kU/L
IgE (Immunoglobulin E), Serum: 13 IU/mL (ref 6–495)
O215-IgE Alpha-Gal: <0.1 kU/L
Pork IgE: <0.1 kU/L

## 2022-09-30 LAB — THYROID ANTIBODIES
Thyroglobulin Antibody: <1 IU/mL (ref 0.0–0.9)
Thyroperoxidase Ab SerPl-aCnc: <9 IU/mL (ref 0–34)

## 2022-10-07 ENCOUNTER — Encounter: Payer: Self-pay | Admitting: Family

## 2022-10-07 ENCOUNTER — Ambulatory Visit: Payer: Federal, State, Local not specified - PPO | Admitting: Family

## 2022-10-07 VITALS — BP 146/96 | HR 79 | Temp 97.7°F | Ht 70.0 in | Wt 380.4 lb

## 2022-10-07 DIAGNOSIS — F419 Anxiety disorder, unspecified: Secondary | ICD-10-CM | POA: Diagnosis not present

## 2022-10-07 DIAGNOSIS — I10 Essential (primary) hypertension: Secondary | ICD-10-CM

## 2022-10-07 DIAGNOSIS — M542 Cervicalgia: Secondary | ICD-10-CM

## 2022-10-07 DIAGNOSIS — K219 Gastro-esophageal reflux disease without esophagitis: Secondary | ICD-10-CM | POA: Diagnosis not present

## 2022-10-07 DIAGNOSIS — F32A Depression, unspecified: Secondary | ICD-10-CM

## 2022-10-07 DIAGNOSIS — Z6841 Body Mass Index (BMI) 40.0 and over, adult: Secondary | ICD-10-CM

## 2022-10-07 MED ORDER — HYDROCHLOROTHIAZIDE 25 MG PO TABS: 25.0000 mg | ORAL_TABLET | Freq: Every day | ORAL | 3 refills | Status: DC

## 2022-10-07 MED ORDER — OMEPRAZOLE 40 MG PO CPDR: 40.0000 mg | DELAYED_RELEASE_CAPSULE | Freq: Every day | ORAL | 1 refills | Status: DC

## 2022-10-07 MED ORDER — SEMAGLUTIDE (1 MG/DOSE) 4 MG/3ML ~~LOC~~ SOPN: 1.0000 mg | PEN_INJECTOR | SUBCUTANEOUS | 0 refills | Status: DC

## 2022-10-07 MED ORDER — LIDOCAINE 5 % EX PTCH: 1.0000 | MEDICATED_PATCH | CUTANEOUS | 12 refills | Status: DC

## 2022-10-07 MED ORDER — BUPROPION HCL ER (XL) 300 MG PO TB24: 300.0000 mg | ORAL_TABLET | Freq: Every day | ORAL | 1 refills | Status: AC

## 2022-10-07 MED ORDER — BUPROPION HCL ER (XL) 300 MG PO TB24: 300.0000 mg | ORAL_TABLET | Freq: Every day | ORAL | 0 refills | Status: DC

## 2022-10-07 MED ORDER — OMEPRAZOLE 40 MG PO CPDR: 40.0000 mg | DELAYED_RELEASE_CAPSULE | Freq: Every day | ORAL | 0 refills | Status: DC

## 2022-10-07 NOTE — Assessment & Plan Note (Signed)
chronic taking Prilosec 40mg  qd pt still on Ozempic refilling Prilosec f/u 4 mos
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
## 2022-10-07 NOTE — Assessment & Plan Note (Addendum)
chronic taking Lisinopril 40mg  & HCTZ 25mg  qd ran out of HCTZ few days ago refilling HCTZ today continue to advise on importance of med adherence f/u 4 mos w/fasting labs

## 2022-10-07 NOTE — Assessment & Plan Note (Signed)
chronic taking Wellbutrin 300mg  qd refilling today f/u 4-28mos

## 2022-10-19 ENCOUNTER — Telehealth: Payer: Self-pay

## 2022-10-19 ENCOUNTER — Ambulatory Visit (INDEPENDENT_AMBULATORY_CARE_PROVIDER_SITE_OTHER): Payer: Federal, State, Local not specified - PPO | Admitting: Family Medicine

## 2022-10-19 ENCOUNTER — Encounter (INDEPENDENT_AMBULATORY_CARE_PROVIDER_SITE_OTHER): Payer: Self-pay | Admitting: Family Medicine

## 2022-10-19 ENCOUNTER — Other Ambulatory Visit (HOSPITAL_BASED_OUTPATIENT_CLINIC_OR_DEPARTMENT_OTHER): Payer: Self-pay

## 2022-10-19 VITALS — BP 146/86 | HR 76 | Temp 98.9°F | Ht 70.0 in | Wt 373.0 lb

## 2022-10-19 DIAGNOSIS — I10 Essential (primary) hypertension: Secondary | ICD-10-CM

## 2022-10-19 DIAGNOSIS — E669 Obesity, unspecified: Secondary | ICD-10-CM | POA: Diagnosis not present

## 2022-10-19 DIAGNOSIS — Z6841 Body Mass Index (BMI) 40.0 and over, adult: Secondary | ICD-10-CM

## 2022-10-19 DIAGNOSIS — E7849 Other hyperlipidemia: Secondary | ICD-10-CM

## 2022-10-19 MED ORDER — SEMAGLUTIDE (1 MG/DOSE) 4 MG/3ML ~~LOC~~ SOPN
1.0000 mg | PEN_INJECTOR | SUBCUTANEOUS | 0 refills | Status: DC
  Filled 2022-10-19: qty 3, 28d supply, fill #0

## 2022-10-19 NOTE — Telephone Encounter (Signed)
Patient called to go over lab results. Patient verbalized understanding that hives are cold induced and are not due to any autoimmune issues. Patient plans to get pepcid and zyrtec otc due to medications not being covered by his insurance. He was bale to pick up the epi pen.

## 2022-11-02 NOTE — Progress Notes (Signed)
Chief Complaint:   OBESITY Shawn [name] is here to discuss his progress with his obesity treatment plan along with follow-up of his obesity related diagnoses. Shawn [name] is on the Category 4 Plan and states he is following his eating plan approximately 0% of the time. Shawn [name] states he is exercising 0 minutes 0 times per week.  Today's visit was #: 26 Starting weight: 392 lbs Starting date: 08/10/2020 Today's weight: 373 lbs Today's date: 10/19/2022 Total lbs lost to date: 19 lbs Total lbs lost since last in-office visit: 0  Interim History: Shawn [name] feels like he has not really been on plan for last 2+ months. Feels like the Shawn [name] with the holidays it may be difficult to adhere to plan. Has been without Ozempic for 2 months. States that the Shawn [name] is busy in terms of work and his motivation to cook is minimal.  Subjective:   1. Essential hypertension Shawn [name]'s blood pressure is well controlled prior to August. Denies chest pain, chest pressure and headache. Shawn [name] 1 week ago then restarted.  2. Other hyperlipidemia Shawn [name]'s last LDL of 170, Hdl of 32, Trigly of 98. Not on [name]. Labs done in March 2023.  Assessment/Plan:   1. Essential hypertension Will follow up with blood pressure at Shawn appointment.  2. Other hyperlipidemia Labs at Shawn appointment.  3. Obesity with current BMI of 52.1 We will refill Ozempic 1 mg SubQ once a week for 1 [name] with 0 refills.  -Refill Semaglutide, 1 MG/DOSE, 4 MG/3ML SOPN; Inject 1 mg as directed once a week.  Dispense: 3 mL; Refill: 0  Shawn [name] is currently in the action stage of change. As such, his goal is to continue with weight loss efforts. He has agreed to the Category 4 Plan.   Exercise goals: All adults should avoid inactivity. Some physical activity is better than none, and adults who participate in any amount of physical activity gain some health benefits.  Behavioral modification strategies:  increasing lean protein intake, meal planning and cooking strategies, keeping healthy foods in the home, and planning for success.  Shawn [name] has agreed to follow-up with our clinic in 4 weeks. He was informed of the importance of frequent follow-up visits to maximize his success with intensive lifestyle modifications for his multiple health conditions.   Objective:   Blood pressure (!) 146/86, pulse 76, temperature 98.9 F (37.2 C), height 5\' 10"  (1.778 m), weight (!) 373 lb (169.2 kg), SpO2 99 %. Body mass index is 53.52 kg/m.  General: Cooperative, alert, well developed, in no acute distress. HEENT: Conjunctivae and lids unremarkable. Cardiovascular: Regular rhythm.  Lungs: Normal work of breathing. Neurologic: No focal deficits.   Lab Results  Component Value Date   CREATININE 1.13 07/21/2022   BUN 12 07/21/2022   NA 142 07/21/2022   K 4.7 07/21/2022   CL 103 07/21/2022   CO2 25 07/21/2022   Lab Results  Component Value Date   ALT 24 07/21/2022   AST 22 07/21/2022   ALKPHOS 86 07/21/2022   BILITOT <0.2 07/21/2022   Lab Results  Component Value Date   HGBA1C 5.5 07/21/2022   HGBA1C 5.3 02/08/2022   HGBA1C 5.4 09/30/2021   HGBA1C 5.6 06/01/2021   HGBA1C 5.5 08/10/2020   Lab Results  Component Value Date   INSULIN 12.8 07/21/2022   INSULIN 25.4 (H) 02/08/2022   INSULIN 36.4 (H) 09/30/2021   INSULIN 138.0 (H) 06/01/2021   INSULIN 28.3 (H) 08/10/2020   Lab Results  Component Value Date   TSH 1.070 02/08/2022   Lab Results  Component Value Date   CHOL 220 (H) 02/08/2022   HDL 32 (L) 02/08/2022   LDLCALC 170 (H) 02/08/2022   LDLDIRECT 141 (H) 12/11/2012   TRIG 98 02/08/2022   CHOLHDL 6.9 (H) 02/08/2022   Lab Results  Component Value Date   VD25OH 32.8 07/21/2022   VD25OH 45.0 02/08/2022   VD25OH 33.9 09/30/2021   Lab Results  Component Value Date   WBC 6.0 09/22/2022   HGB 14.1 09/22/2022   HCT 42.9 09/22/2022   MCV 83 09/22/2022   PLT 355  08/10/2020   No results found for: "IRON", "TIBC", "FERRITIN"  Attestation Statements:   Reviewed by clinician on day of visit: allergies, [name], problem list, medical history, surgical history, family history, social history, and previous encounter notes.  Time spent on visit including pre-visit chart review and post-visit care and charting was 25 minutes.   I, Fortino Sic, RMA am acting as transcriptionist for Reuben Likes, MD.  I have reviewed the above documentation for accuracy and completeness, and I agree with the above. - Reuben Likes, MD

## 2022-11-24 ENCOUNTER — Ambulatory Visit (INDEPENDENT_AMBULATORY_CARE_PROVIDER_SITE_OTHER): Payer: Federal, State, Local not specified - PPO | Admitting: Adult Health

## 2022-11-24 ENCOUNTER — Telehealth (INDEPENDENT_AMBULATORY_CARE_PROVIDER_SITE_OTHER): Payer: Self-pay

## 2022-11-24 ENCOUNTER — Other Ambulatory Visit (HOSPITAL_BASED_OUTPATIENT_CLINIC_OR_DEPARTMENT_OTHER): Payer: Self-pay

## 2022-11-24 ENCOUNTER — Encounter (INDEPENDENT_AMBULATORY_CARE_PROVIDER_SITE_OTHER): Payer: Self-pay | Admitting: Adult Health

## 2022-11-24 ENCOUNTER — Encounter (HOSPITAL_BASED_OUTPATIENT_CLINIC_OR_DEPARTMENT_OTHER): Payer: Self-pay | Admitting: Pharmacist

## 2022-11-24 VITALS — BP 134/84 | HR 77 | Temp 98.2°F | Ht 70.0 in | Wt 372.0 lb

## 2022-11-24 DIAGNOSIS — E559 Vitamin D deficiency, unspecified: Secondary | ICD-10-CM | POA: Diagnosis not present

## 2022-11-24 DIAGNOSIS — Z6841 Body Mass Index (BMI) 40.0 and over, adult: Secondary | ICD-10-CM

## 2022-11-24 DIAGNOSIS — E88819 Insulin resistance, unspecified: Secondary | ICD-10-CM | POA: Diagnosis not present

## 2022-11-24 DIAGNOSIS — I10 Essential (primary) hypertension: Secondary | ICD-10-CM | POA: Diagnosis not present

## 2022-11-24 DIAGNOSIS — E7849 Other hyperlipidemia: Secondary | ICD-10-CM | POA: Diagnosis not present

## 2022-11-24 DIAGNOSIS — E669 Obesity, unspecified: Secondary | ICD-10-CM

## 2022-11-24 MED ORDER — VITAMIN D (ERGOCALCIFEROL) 1.25 MG (50000 UNIT) PO CAPS
ORAL_CAPSULE | ORAL | 0 refills | Status: DC
  Filled 2022-11-24: qty 12, 84d supply, fill #0

## 2022-11-24 MED ORDER — SEMAGLUTIDE (1 MG/DOSE) 4 MG/3ML ~~LOC~~ SOPN
1.0000 mg | PEN_INJECTOR | SUBCUTANEOUS | 0 refills | Status: DC
  Filled 2022-11-24: qty 3, 28d supply, fill #0

## 2022-11-24 NOTE — Telephone Encounter (Signed)
(  KeyNicola Girt) Rx #: [id number] Ozempic (1 MG/DOSE) 4MG /3ML pen-injectors  Prior auth submitted thru covermymeds.

## 2022-11-25 ENCOUNTER — Other Ambulatory Visit (HOSPITAL_BASED_OUTPATIENT_CLINIC_OR_DEPARTMENT_OTHER): Payer: Self-pay

## 2022-11-25 ENCOUNTER — Encounter (INDEPENDENT_AMBULATORY_CARE_PROVIDER_SITE_OTHER): Payer: Self-pay | Admitting: Adult Health

## 2022-11-25 LAB — COMPREHENSIVE METABOLIC PANEL
ALT: 15 IU/L (ref 0–44)
AST: 17 IU/L (ref 0–40)
Albumin/Globulin Ratio: 1.6 (ref 1.2–2.2)
Albumin: 4.4 g/dL (ref 4.1–5.1)
Alkaline Phosphatase: 91 IU/L (ref 44–121)
BUN/Creatinine Ratio: 12 (ref 9–20)
BUN: 14 mg/dL (ref 6–20)
Bilirubin Total: 0.3 mg/dL (ref 0.0–1.2)
CO2: 27 mmol/L (ref 20–29)
Calcium: 9.5 mg/dL (ref 8.7–10.2)
Chloride: 100 mmol/L (ref 96–106)
Creatinine, Ser: 1.13 mg/dL (ref 0.76–1.27)
Globulin, Total: 2.7 g/dL (ref 1.5–4.5)
Glucose: 81 mg/dL (ref 70–99)
Potassium: 4.8 mmol/L (ref 3.5–5.2)
Sodium: 140 mmol/L (ref 134–144)
Total Protein: 7.1 g/dL (ref 6.0–8.5)
eGFR: 87 mL/min/{1.73_m2} (ref >59)

## 2022-11-25 LAB — VITAMIN D 25 HYDROXY (VIT D DEFICIENCY, FRACTURES): Vit D, 25-Hydroxy: 26 ng/mL — ABNORMAL LOW (ref 30.0–100.0)

## 2022-11-25 LAB — HEMOGLOBIN A1C
Est. average glucose Bld gHb Est-mCnc: 108 mg/dL
Hgb A1c MFr Bld: 5.4 % (ref 4.8–5.6)

## 2022-11-25 LAB — INSULIN, RANDOM: INSULIN: 23 u[IU]/mL (ref 2.6–24.9)

## 2022-11-28 ENCOUNTER — Other Ambulatory Visit (INDEPENDENT_AMBULATORY_CARE_PROVIDER_SITE_OTHER): Payer: Self-pay | Admitting: Adult Health

## 2022-11-28 ENCOUNTER — Telehealth (INDEPENDENT_AMBULATORY_CARE_PROVIDER_SITE_OTHER): Payer: Self-pay | Admitting: Adult Health

## 2022-11-28 MED ORDER — WEGOVY 1 MG/0.5ML ~~LOC~~ SOAJ: 1.0000 mg | SUBCUTANEOUS | 0 refills | Status: DC

## 2022-11-28 NOTE — Telephone Encounter (Signed)
PA submitted via CoverMyMeds for [facility]. Awaiting response.

## 2022-11-28 NOTE — Telephone Encounter (Signed)
This is to inform you that your Prior Authorization request for the above member's Wegovy 1MG /0.5ML [place] SOAJ has been approved. If you are changing the member's therapy the previously approved therapy will be canceled and replaced. The authorization is valid from 10/29/2022 through 11/28/2023. A letter of explanation will also be mailed to the patient. Approvals may be subject to dosing limits in accordance with FDA approved labeling, accepted compendia, evidence-based practice guidelines or your prescription drug plan benefits. You can go to PopPath.it, scroll down and select downloaded here, and find the medication to see if there are dosing limits. This PA determination only applies to medications dispensed by a pharmacy and billed to the [facility] pharmacy benefit

## 2022-11-28 NOTE — Progress Notes (Unsigned)
Chief Complaint:   OBESITY Shawn [name] is here to discuss his progress with his obesity treatment plan along with follow-up of his obesity related diagnoses. Shawn [name] is on the Category 4 Plan and states he is following his eating plan approximately 35% of the time. Shawn [name] states he is not exercising.  Today's visit was #: 27 Starting weight: 392 LBS Starting date: 08/10/2020 Today's weight: 372 LBS Today's date: 11/24/2022 Total lbs lost to date: 20 LBS Total lbs lost since last in-office visit: 1 LB  Interim History: ***  Subjective:   1. Essential hypertension Blood pressure is chronically uncontrolled.  Patient routinely runs out of his antihypertensive prescription.  PCP manages.  2. Vitamin D deficiency Currently on weekly ergocalciferol.  3. Other hyperlipidemia Lipid panel *** Patient denies tobacco or vaping.  4. Insulin resistance Currently on weekly Ozempic 1 mg.  No side effects with GLP-1 therapy.  Assessment/Plan:   1. Essential hypertension Consistently taking lisinopril 40 mg daily and HCTZ 25 mg daily per PCP.  Check labs today. - Comprehensive metabolic panel  2. Vitamin D deficiency Check labs today.  Refill- Vitamin D, Ergocalciferol, (DRISDOL) 1.25 MG (50000 UNIT) CAPS capsule; TAKE ONE CAPSULE BY MOUTH ONCE A WEEK FOR 12 WEEKS.  Dispense: 12 capsule; Refill: 0  - VITAMIN D 25 Hydroxy (Vit-D Deficiency, Fractures)  3. Other hyperlipidemia Follow-up with PCP.  4. Insulin resistance Check labs today.  - Insulin, random - Hemoglobin A1c  5. Obesity with current BMI of 53.5 [name] is currently in the action stage of change. As such, his goal is to continue with weight loss efforts. He has agreed to the Category 4 Plan.   Exercise goals: All adults should avoid inactivity. Some physical activity is better than none, and adults who participate in any amount of physical activity gain some health benefits.  Behavioral modification  strategies: increasing lean protein intake, decreasing simple carbohydrates, meal planning and cooking strategies, keeping healthy foods in the home, and planning for success.  Shawn [name] has agreed to follow-up with our clinic in 4 weeks. He was informed of the importance of frequent follow-up visits to maximize his success with intensive lifestyle modifications for his multiple health conditions.   Shawn [name] was informed we would discuss his lab results at his next visit unless there is a critical issue that needs to be addressed sooner. Shawn [name] agreed to keep his next visit at the agreed upon time to discuss these results.  Objective:   Blood pressure 134/84, pulse 77, temperature 98.2 F (36.8 C), height 5\' 10"  (1.778 m), weight (!) 372 lb (168.7 kg), SpO2 96 %. Body mass index is 53.38 kg/m.  General: Cooperative, alert, well developed, in no acute distress. HEENT: Conjunctivae and lids unremarkable. Cardiovascular: Regular rhythm.  Lungs: Normal work of breathing. Neurologic: No focal deficits.   Lab Results  Component Value Date   CREATININE 1.13 11/24/2022   BUN 14 11/24/2022   NA 140 11/24/2022   K 4.8 11/24/2022   CL 100 11/24/2022   CO2 27 11/24/2022   Lab Results  Component Value Date   ALT 15 11/24/2022   AST 17 11/24/2022   ALKPHOS 91 11/24/2022   BILITOT 0.3 11/24/2022   Lab Results  Component Value Date   HGBA1C 5.4 11/24/2022   HGBA1C 5.5 07/21/2022   HGBA1C 5.3 02/08/2022   HGBA1C 5.4 09/30/2021   HGBA1C 5.6 06/01/2021   Lab Results  Component Value Date   INSULIN 23.0 11/24/2022   INSULIN 12.8  07/21/2022   INSULIN 25.4 (H) 02/08/2022   INSULIN 36.4 (H) 09/30/2021   INSULIN 138.0 (H) 06/01/2021   Lab Results  Component Value Date   TSH 1.070 02/08/2022   Lab Results  Component Value Date   CHOL 220 (H) 02/08/2022   HDL 32 (L) 02/08/2022   LDLCALC 170 (H) 02/08/2022   LDLDIRECT 141 (H) 12/11/2012   TRIG 98 02/08/2022   CHOLHDL 6.9 (H)  02/08/2022   Lab Results  Component Value Date   VD25OH 26.0 (L) 11/24/2022   VD25OH 32.8 07/21/2022   VD25OH 45.0 02/08/2022   Lab Results  Component Value Date   WBC 6.0 09/22/2022   HGB 14.1 09/22/2022   HCT 42.9 09/22/2022   MCV 83 09/22/2022   PLT 355 08/10/2020   No results found for: "IRON", "TIBC", "FERRITIN"  Attestation Statements:   Reviewed by clinician on day of visit: allergies, medications, problem list, medical history, surgical history, family history, social history, and previous encounter notes.  I, Davy Pique, RMA, am acting as Location manager for Mina Marble, NP.  I have reviewed the above documentation for accuracy and completeness, and I agree with the above. -  ***

## 2022-12-05 ENCOUNTER — Other Ambulatory Visit (HOSPITAL_BASED_OUTPATIENT_CLINIC_OR_DEPARTMENT_OTHER): Payer: Self-pay

## 2022-12-21 DIAGNOSIS — G4733 Obstructive sleep apnea (adult) (pediatric): Secondary | ICD-10-CM | POA: Diagnosis not present

## 2022-12-22 ENCOUNTER — Ambulatory Visit (INDEPENDENT_AMBULATORY_CARE_PROVIDER_SITE_OTHER): Payer: Federal, State, Local not specified - PPO | Admitting: Adult Health

## 2022-12-22 ENCOUNTER — Encounter (INDEPENDENT_AMBULATORY_CARE_PROVIDER_SITE_OTHER): Payer: Self-pay | Admitting: Adult Health

## 2022-12-22 VITALS — BP 121/75 | HR 90 | Temp 98.2°F | Ht 70.0 in | Wt 371.0 lb

## 2022-12-22 DIAGNOSIS — Z6841 Body Mass Index (BMI) 40.0 and over, adult: Secondary | ICD-10-CM

## 2022-12-22 DIAGNOSIS — E88819 Insulin resistance, unspecified: Secondary | ICD-10-CM

## 2022-12-22 DIAGNOSIS — I10 Essential (primary) hypertension: Secondary | ICD-10-CM

## 2022-12-22 DIAGNOSIS — E559 Vitamin D deficiency, unspecified: Secondary | ICD-10-CM

## 2022-12-22 DIAGNOSIS — E669 Obesity, unspecified: Secondary | ICD-10-CM | POA: Diagnosis not present

## 2022-12-22 MED ORDER — VITAMIN D (ERGOCALCIFEROL) 1.25 MG (50000 UNIT) PO CAPS: ORAL_CAPSULE | ORAL | 0 refills | Status: DC

## 2022-12-22 MED ORDER — WEGOVY 1.7 MG/0.75ML ~~LOC~~ SOAJ: 1.7000 mg | SUBCUTANEOUS | 0 refills | Status: DC

## 2023-01-04 NOTE — Progress Notes (Signed)
Chief Complaint:   OBESITY Shawn [name] is here to discuss his progress with his obesity treatment plan along with follow-up of his obesity related diagnoses. Shawn [name] is on the Category 4 Plan and states he is following his eating plan approximately 38% of the time. [name] states he is cardio and aerobics 60+ minutes 2-3 times per week.  Today's visit was #: 26 Starting weight: 392 LBS Starting date: 08/10/2020 Today's weight: 371 LBS Today's date: 12/22/2022 Total lbs lost to date: 21 LBS Total lbs lost since last in-office visit: 1 LB  Interim History:  [name] was started on Ozempic 0.54m for IFG on 04/06/2021 He has been in Ozempic 133m> 18 months. 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 Essential hypertension Discussed labs with patient today. Patient's blood pressure is at goal this office visit.   On 11/24/2022 CMP, creatinine 1.13, GFR 87.   PCP manages HCTZ 25 mg daily and Zestril 40 mg daily.  2. Vitamin D deficiency Worsening.  Discussed labs with patient today.   On 11/24/2022, vitamin D level was 26.0.   Patient is on weekly ergocalciferol and has been off for several weeks.  3. Insulin resistance Worsening.  Discussed labs with patient today. On 11/24/2022, A1c was 5.4.  Insulin level was 23.0.  He is currently on Wegovy 86m65mnce weekly injection- tolerating well. He endorses polyphagia with current Wegovy dose.  Assessment/Plan:   1. Essential hypertension Continue HCTZ 25 mg daily and Zestril 40 mg daily.  2. Vitamin D deficiency Refill- Vitamin D, Ergocalciferol, (DRISDOL) 1.25 MG (50000 UNIT) CAPS capsule; TAKE ONE CAPSULE BY MOUTH ONCE A WEEK FOR 12 WEEKS.  Dispense: 8 capsule; Refill: 0  3. Insulin resistance Continue weekly GLP-1 therapy.  4. Obesity with current BMI of 53.2 Refill and increase-  Semaglutide-Weight Management (WEGOVY) 1.7 MG/0.75ML SOAJ; Inject 1.7 mg into the skin once a week.  Dispense: 3 mL; Refill: 0  [name] currently in the action stage of change. As such, his goal is to continue with weight loss efforts. He has agreed to the Category 4 Plan.   Exercise goals:  As is.  Behavioral modification strategies: increasing lean protein intake, decreasing simple carbohydrates, meal planning and cooking strategies, keeping healthy foods in the home, and planning for success.  [name] agreed to follow-up with our clinic in 4 weeks. He was informed of the importance of frequent follow-up visits to maximize his success with intensive lifestyle modifications for his multiple health conditions.   Objective:   Blood pressure 121/75, pulse 90, temperature 98.2 F (36.8 C), height 5' 10"$  (1.778 m), weight (!) 371 lb (168.3 kg), SpO2 97 %. Body mass index is 53.23 kg/m.  General: Cooperative, alert, well developed, in no acute distress. HEENT: Conjunctivae and lids unremarkable. Cardiovascular: Regular rhythm.  Lungs: Normal work of breathing. Neurologic: No focal deficits.   Lab Results  Component Value Date   CREATININE 1.13 11/24/2022   BUN 14 11/24/2022   NA 140 11/24/2022   K 4.8 11/24/2022   CL 100 11/24/2022   CO2 27 11/24/2022   Lab Results  Component Value Date   ALT 15 11/24/2022   AST 17 11/24/2022   ALKPHOS 91 11/24/2022   BILITOT 0.3 11/24/2022   Lab Results  Component Value Date   HGBA1C 5.4 11/24/2022   HGBA1C 5.5  07/21/2022   HGBA1C 5.3 02/08/2022   HGBA1C 5.4 09/30/2021   HGBA1C 5.6 06/01/2021   Lab Results  Component Value Date   INSULIN 23.0 11/24/2022   INSULIN 12.8 07/21/2022   INSULIN 25.4 (H) 02/08/2022   INSULIN 36.4 (H) 09/30/2021   INSULIN 138.0 (H) 06/01/2021   Lab Results  Component Value Date   TSH 1.070 02/08/2022   Lab Results  Component Value Date   CHOL 220 (H) 02/08/2022   HDL 32 (L) 02/08/2022    LDLCALC 170 (H) 02/08/2022   LDLDIRECT 141 (H) 12/11/2012   TRIG 98 02/08/2022   CHOLHDL 6.9 (H) 02/08/2022   Lab Results  Component Value Date   VD25OH 26.0 (L) 11/24/2022   VD25OH 32.8 07/21/2022   VD25OH 45.0 02/08/2022   Lab Results  Component Value Date   WBC 6.0 09/22/2022   HGB 14.1 09/22/2022   HCT 42.9 09/22/2022   MCV 83 09/22/2022   PLT 355 08/10/2020   No results found for: "IRON", "TIBC", "FERRITIN"  Attestation Statements:   Reviewed by clinician on day of visit: allergies, medications, problem list, medical history, surgical history, family history, social history, and previous encounter notes.  I, Davy Pique, RMA, am acting as Location manager for Mina Marble, NP.  I have reviewed the above documentation for accuracy and completeness, and I agree with the above. -  Angelise Petrich d. Onesimo Lingard, NP-C

## 2023-01-18 ENCOUNTER — Encounter (INDEPENDENT_AMBULATORY_CARE_PROVIDER_SITE_OTHER): Payer: Self-pay | Admitting: Adult Health

## 2023-01-19 ENCOUNTER — Ambulatory Visit (INDEPENDENT_AMBULATORY_CARE_PROVIDER_SITE_OTHER): Payer: Federal, State, Local not specified - PPO | Admitting: Adult Health

## 2023-01-19 DIAGNOSIS — G4733 Obstructive sleep apnea (adult) (pediatric): Secondary | ICD-10-CM | POA: Diagnosis not present

## 2023-01-24 ENCOUNTER — Other Ambulatory Visit (INDEPENDENT_AMBULATORY_CARE_PROVIDER_SITE_OTHER): Payer: Self-pay | Admitting: Adult Health

## 2023-01-24 ENCOUNTER — Encounter (INDEPENDENT_AMBULATORY_CARE_PROVIDER_SITE_OTHER): Payer: Self-pay | Admitting: Adult Health

## 2023-01-25 ENCOUNTER — Other Ambulatory Visit (INDEPENDENT_AMBULATORY_CARE_PROVIDER_SITE_OTHER): Payer: Self-pay | Admitting: Adult Health

## 2023-02-02 ENCOUNTER — Ambulatory Visit (INDEPENDENT_AMBULATORY_CARE_PROVIDER_SITE_OTHER): Payer: Federal, State, Local not specified - PPO | Admitting: Adult Health

## 2023-02-02 ENCOUNTER — Encounter (INDEPENDENT_AMBULATORY_CARE_PROVIDER_SITE_OTHER): Payer: Self-pay | Admitting: Adult Health

## 2023-02-02 VITALS — BP 130/82 | HR 79 | Temp 98.4°F | Ht 70.0 in | Wt 367.0 lb

## 2023-02-02 DIAGNOSIS — E669 Obesity, unspecified: Secondary | ICD-10-CM | POA: Diagnosis not present

## 2023-02-02 DIAGNOSIS — Z6841 Body Mass Index (BMI) 40.0 and over, adult: Secondary | ICD-10-CM

## 2023-02-02 DIAGNOSIS — E559 Vitamin D deficiency, unspecified: Secondary | ICD-10-CM | POA: Diagnosis not present

## 2023-02-02 DIAGNOSIS — R7301 Impaired fasting glucose: Secondary | ICD-10-CM

## 2023-02-02 MED ORDER — WEGOVY 1.7 MG/0.75ML ~~LOC~~ SOAJ: 1.7000 mg | SUBCUTANEOUS | 1 refills | Status: DC

## 2023-02-02 MED ORDER — VITAMIN D (ERGOCALCIFEROL) 1.25 MG (50000 UNIT) PO CAPS: ORAL_CAPSULE | ORAL | 0 refills | Status: DC

## 2023-02-02 MED ORDER — WEGOVY 1.7 MG/0.75ML ~~LOC~~ SOAJ: 1.7000 mg | SUBCUTANEOUS | 0 refills | Status: DC

## 2023-02-02 NOTE — Progress Notes (Signed)
WEIGHT SUMMARY AND BIOMETRICS  Vitals Temp: 98.4 F (36.9 C) BP: 130/82 Pulse Rate: 79 SpO2: 98 %   Anthropometric Measurements Height: '5\' 10"'$  (1.778 m) Weight: (!) 367 lb (166.5 kg) BMI (Calculated): 52.66 Weight at Last Visit: 371lb Weight Lost Since Last Visit: 4lb Weight Gained Since Last Visit: 0 Starting Weight: 392lb Total Weight Loss (lbs): 25 lb (11.3 kg)   Body Composition  Body Fat %: 41.7 % Fat Mass (lbs): 153.2 lbs Muscle Mass (lbs): 204 lbs Total Body Water (lbs): 147.6 lbs Visceral Fat Rating : 29   Other Clinical Data Fasting: no Labs: no Today's Visit #: 29 Starting Date: 08/10/20    Chief Complaint:   OBESITY Abiola is here to discuss his progress with his obesity treatment plan. He is on the the Category 4 Plan and states he is following his eating plan approximately 25 % of the time.  He states he is exercising cardio- step practice 120 minutes 3 times per week. He will drink 4 x 16.9 oz water bottles at practice.  Interim History:  Ms. Foglia was started on Ozempic 0.'25mg'$  for IFG on 04/06/2021 He has been in Ozempic '1mg'$  > 18 months. 11/24/2022 Ozempic replaced with [id number]. 12/22/2022 Ozempic increased '1mg'$  to 1.'7mg'$ - has had 4 doses. He had to reschedule last OV due to acute illness and missed one dose of Wegovy 1.'7mg'$  - last dose 01/22/2023 Denies mass in neck, dysphagia, dyspepsia, persistent hoarseness, abdominal pain, or N/V/C   Life events since last OV His girlfriends sister delivered a healthy baby boy 12/23/2022. 2/6/204- sister received an injectable to treat chronic allergies and suffered a hemorrhagic stroke She was medically stablized and in ICU for 2 weeks. She was d/c'd to rehabiliation facility- there for 3 weeks. She was d/c'd home today!!!! Current deficits- RUE paralysis  His girlfriend is age 38 and her sister is 39 years old.  His girlfriend has been travelling from [place] to [place], [place] back and forth to help with  sister and help with her infant nephew.   On a happier note his maternal grandfather turns 45 today Mikeal Hawthorne)- surprise party planned for sat in [place]  Subjective:   1. IFG (impaired fasting glucose) Ms. Hailu was started on Ozempic 0.'25mg'$  for IFG on 04/06/2021 He has been in Ozempic '1mg'$  > 18 months. 11/24/2022 Ozempic replaced with [id number]. 12/22/2022 Ozempic increased '1mg'$  to 1.'7mg'$ - has had 4 doses. He had to reschedule last OV due to acute illness and missed one dose of Wegovy 1.'7mg'$  - last dose 01/22/2023 Denies mass in neck, dysphagia, dyspepsia, persistent hoarseness, abdominal pain, or N/V/C   Latest Reference Range & Units 09/10/21 09:59 02/08/22 15:11 07/21/22 09:33 11/24/22 11:07  Glucose 70 - 99 mg/dL 94 81 83 81   2. Vitamin D deficiency  Latest Reference Range & Units 11/24/22 11:07  Vitamin D, 25-Hydroxy 30.0 - 100.0 ng/mL 26.0 (L)  (L): Data is abnormally low He is on weekly Ergocalciferol- denies N/V/Muscle Weakness  Assessment/Plan:   1. IFG (impaired fasting glucose) Continue healthy eating, regular cardiovascular exercise, and Wegovy therapy.  2. Vitamin D deficiency Refill Ergocaciferol 50,000 Iu once week Disp 12 RF0  4. Obesity, Starting BMI Refill Wegovy 1.'7mg'$  once week Disp 47m RF 0  SMcdanielis currently in the action stage of change. As such, his goal is to continue with weight loss efforts. He has agreed to the Category 4 Plan.   Exercise goals: For substantial health benefits, adults should do at least  150 minutes (2 hours and 30 minutes) a week of moderate-intensity, or 75 minutes (1 hour and 15 minutes) a week of vigorous-intensity aerobic physical activity, or an equivalent combination of moderate- and vigorous-intensity aerobic activity. Aerobic activity should be performed in episodes of at least 10 minutes, and preferably, it should be spread throughout the week.  Behavioral modification strategies: increasing lean protein intake, decreasing simple  carbohydrates, increasing vegetables, increasing water intake, decreasing eating out, meal planning and cooking strategies, and planning for success.  Edna has agreed to follow-up with our clinic in 4 weeks. He was informed of the importance of frequent follow-up visits to maximize his success with intensive lifestyle modifications for his multiple health conditions.   Objective:   Blood pressure 130/82, pulse 79, temperature 98.4 F (36.9 C), height '5\' 10"'$  (1.778 m), weight (!) 367 lb (166.5 kg), SpO2 98 %. Body mass index is 52.66 kg/m.  General: Cooperative, alert, well developed, in no acute distress. HEENT: Conjunctivae and lids unremarkable. Cardiovascular: Regular rhythm.  Lungs: Normal work of breathing. Neurologic: No focal deficits.   Lab Results  Component Value Date   CREATININE 1.13 11/24/2022   BUN 14 11/24/2022   NA 140 11/24/2022   K 4.8 11/24/2022   CL 100 11/24/2022   CO2 27 11/24/2022   Lab Results  Component Value Date   ALT 15 11/24/2022   AST 17 11/24/2022   ALKPHOS 91 11/24/2022   BILITOT 0.3 11/24/2022   Lab Results  Component Value Date   HGBA1C 5.4 11/24/2022   HGBA1C 5.5 07/21/2022   HGBA1C 5.3 02/08/2022   HGBA1C 5.4 09/30/2021   HGBA1C 5.6 06/01/2021   Lab Results  Component Value Date   INSULIN 23.0 11/24/2022   INSULIN 12.8 07/21/2022   INSULIN 25.4 (H) 02/08/2022   INSULIN 36.4 (H) 09/30/2021   INSULIN 138.0 (H) 06/01/2021   Lab Results  Component Value Date   TSH 1.070 02/08/2022   Lab Results  Component Value Date   CHOL 220 (H) 02/08/2022   HDL 32 (L) 02/08/2022   LDLCALC 170 (H) 02/08/2022   LDLDIRECT 141 (H) 12/11/2012   TRIG 98 02/08/2022   CHOLHDL 6.9 (H) 02/08/2022   Lab Results  Component Value Date   VD25OH 26.0 (L) 11/24/2022   VD25OH 32.8 07/21/2022   VD25OH 45.0 02/08/2022   Lab Results  Component Value Date   WBC 6.0 09/22/2022   HGB 14.1 09/22/2022   HCT 42.9 09/22/2022   MCV 83 09/22/2022    PLT 355 08/10/2020   No results found for: "IRON", "TIBC", "FERRITIN"  Attestation Statements:   Reviewed by clinician on day of visit: allergies, medications, problem list, medical history, surgical history, family history, social history, and previous encounter notes.  I have reviewed the above documentation for accuracy and completeness, and I agree with the above. -  Taliesin Hartlage d. Kanyon Seibold, NP-C

## 2023-02-06 DIAGNOSIS — M7918 Myalgia, other site: Secondary | ICD-10-CM | POA: Diagnosis not present

## 2023-02-06 DIAGNOSIS — M9904 Segmental and somatic dysfunction of sacral region: Secondary | ICD-10-CM | POA: Diagnosis not present

## 2023-02-06 DIAGNOSIS — M9901 Segmental and somatic dysfunction of cervical region: Secondary | ICD-10-CM | POA: Diagnosis not present

## 2023-02-06 DIAGNOSIS — M9902 Segmental and somatic dysfunction of thoracic region: Secondary | ICD-10-CM | POA: Diagnosis not present

## 2023-02-06 DIAGNOSIS — M25659 Stiffness of unspecified hip, not elsewhere classified: Secondary | ICD-10-CM | POA: Diagnosis not present

## 2023-02-06 DIAGNOSIS — M9903 Segmental and somatic dysfunction of lumbar region: Secondary | ICD-10-CM | POA: Diagnosis not present

## 2023-02-09 DIAGNOSIS — L219 Seborrheic dermatitis, unspecified: Secondary | ICD-10-CM | POA: Diagnosis not present

## 2023-02-09 DIAGNOSIS — K130 Diseases of lips: Secondary | ICD-10-CM | POA: Diagnosis not present

## 2023-02-09 DIAGNOSIS — L819 Disorder of pigmentation, unspecified: Secondary | ICD-10-CM | POA: Diagnosis not present

## 2023-02-09 DIAGNOSIS — L649 Androgenic alopecia, unspecified: Secondary | ICD-10-CM | POA: Diagnosis not present

## 2023-02-16 DIAGNOSIS — M9903 Segmental and somatic dysfunction of lumbar region: Secondary | ICD-10-CM | POA: Diagnosis not present

## 2023-02-16 DIAGNOSIS — M9901 Segmental and somatic dysfunction of cervical region: Secondary | ICD-10-CM | POA: Diagnosis not present

## 2023-02-16 DIAGNOSIS — M9902 Segmental and somatic dysfunction of thoracic region: Secondary | ICD-10-CM | POA: Diagnosis not present

## 2023-02-16 DIAGNOSIS — M9904 Segmental and somatic dysfunction of sacral region: Secondary | ICD-10-CM | POA: Diagnosis not present

## 2023-02-16 DIAGNOSIS — M7918 Myalgia, other site: Secondary | ICD-10-CM | POA: Diagnosis not present

## 2023-02-16 DIAGNOSIS — M25659 Stiffness of unspecified hip, not elsewhere classified: Secondary | ICD-10-CM | POA: Diagnosis not present

## 2023-02-19 DIAGNOSIS — G4733 Obstructive sleep apnea (adult) (pediatric): Secondary | ICD-10-CM | POA: Diagnosis not present

## 2023-02-22 ENCOUNTER — Other Ambulatory Visit (INDEPENDENT_AMBULATORY_CARE_PROVIDER_SITE_OTHER): Payer: Self-pay | Admitting: Adult Health

## 2023-02-22 DIAGNOSIS — E559 Vitamin D deficiency, unspecified: Secondary | ICD-10-CM

## 2023-02-26 ENCOUNTER — Other Ambulatory Visit: Payer: Self-pay | Admitting: Family

## 2023-03-02 ENCOUNTER — Encounter (INDEPENDENT_AMBULATORY_CARE_PROVIDER_SITE_OTHER): Payer: Self-pay | Admitting: Adult Health

## 2023-03-02 ENCOUNTER — Ambulatory Visit (INDEPENDENT_AMBULATORY_CARE_PROVIDER_SITE_OTHER): Payer: Federal, State, Local not specified - PPO | Admitting: Adult Health

## 2023-03-02 VITALS — BP 131/80 | HR 75 | Temp 98.5°F | Ht 70.0 in | Wt 369.0 lb

## 2023-03-02 DIAGNOSIS — E669 Obesity, unspecified: Secondary | ICD-10-CM

## 2023-03-02 DIAGNOSIS — Z6841 Body Mass Index (BMI) 40.0 and over, adult: Secondary | ICD-10-CM | POA: Diagnosis not present

## 2023-03-02 DIAGNOSIS — R7301 Impaired fasting glucose: Secondary | ICD-10-CM

## 2023-03-02 DIAGNOSIS — I10 Essential (primary) hypertension: Secondary | ICD-10-CM | POA: Diagnosis not present

## 2023-03-02 MED ORDER — WEGOVY 2.4 MG/0.75ML ~~LOC~~ SOAJ: 2.4000 mg | SUBCUTANEOUS | 0 refills | Status: DC

## 2023-03-02 NOTE — Progress Notes (Signed)
WEIGHT SUMMARY AND BIOMETRICS  Vitals Temp: 98.5 F (36.9 C) BP: 131/80 Pulse Rate: 75 SpO2: 98 %   Anthropometric Measurements Height: 5\' 10"  (1.778 m) Weight: (!) 369 lb (167.4 kg) BMI (Calculated): 52.95 Weight at Last Visit: 367lb Weight Lost Since Last Visit: 0 Weight Gained Since Last Visit: 2b Starting Weight: 392lb Total Weight Loss (lbs): 23 lb (10.4 kg)   Body Composition  Body Fat %: 42.1 % Fat Mass (lbs): 155.6 lbs Muscle Mass (lbs): 203.8 lbs Total Body Water (lbs): 152 lbs Visceral Fat Rating : 29   Other Clinical Data Fasting: No Labs: No Today's Visit #: 30 Starting Date: 08/10/20    Chief Complaint:   OBESITY Shawn [name] is here to discuss his progress with his obesity treatment plan. He is on the the Category 4 Plan and states he is following his eating plan approximately 28 % of the time.  He states he is exercising Cardio/Step Practice 60+ minutes 3 times per week.   Interim History:  He feels that he drifted off eating to following commitments:  Sunday: Church 3pm- 7pm Work: 7:30 am- 5pm Step Practices: Mon/Tues/Thurs 7pm until ? Girlfriend frequently gone to assist with her sister's recovery  Step Competition next Sat March 11, 2023  He has been on increased dose of Wegovy 1.7 weekly injection since early Feb 2024.  Subjective:   1. Essential hypertension BP above goal. He denies CP with exertion.  2. IFG (impaired fasting glucose)  Latest Reference Range & Units 09/10/21 09:59 02/08/22 15:11 07/21/22 09:33 11/24/22 11:07  Glucose 70 - 99 mg/dL 94 81 83 81  07/23/1193 Ozempic replaced with Wegovy. 12/22/2022 Ozempic increased 1mg  to 1.7mg  Denies mass in neck, dysphagia, dyspepsia, persistent hoarseness, abdominal pain, or N/V/C  He reports increase polyphagia.  Assessment/Plan:   1. Essential hypertension Continue minoxidil (LONITEN) 2.5 MG tablet  lisinopril (ZESTRIL) 40 MG tablet  HCTZ 25 MG     2. IFG (impaired  fasting glucose) Reduce sugar/CHO Increase protein Refill and increase Wegovy 2.4mg  once weekly injection Disp 86ml RF 0  3. Obesity with current BMI of 52.95  Shawn [name] is currently in the action stage of change. As such, his goal is to continue with weight loss efforts. He has agreed to the Category 4 Plan.   Exercise goals: For substantial health benefits, adults should do at least 150 minutes (2 hours and 30 minutes) a week of moderate-intensity, or 75 minutes (1 hour and 15 minutes) a week of vigorous-intensity aerobic physical activity, or an equivalent combination of moderate- and vigorous-intensity aerobic activity. Aerobic activity should be performed in episodes of at least 10 minutes, and preferably, it should be spread throughout the week.  Behavioral modification strategies: increasing lean protein intake, decreasing simple carbohydrates, increasing vegetables, increasing water intake, and planning for success.  [name] has agreed to follow-up with our clinic in 4 weeks. He was informed of the importance of frequent follow-up visits to maximize his success with intensive lifestyle modifications for his multiple health conditions.   Objective:   Blood pressure 131/80, pulse 75, temperature 98.5 F (36.9 C), height 5\' 10"  (1.778 m), weight (!) 369 lb (167.4 kg), SpO2 98 %. Body mass index is 52.95 kg/m.  General: Cooperative, alert, well developed, in no acute distress. HEENT: Conjunctivae and lids unremarkable. Cardiovascular: Regular rhythm.  Lungs: Normal work of breathing. Neurologic: No focal deficits.   Lab Results  Component Value Date   CREATININE 1.13 11/24/2022   BUN 14 11/24/2022  NA 140 11/24/2022   K 4.8 11/24/2022   CL 100 11/24/2022   CO2 27 11/24/2022   Lab Results  Component Value Date   ALT 15 11/24/2022   AST 17 11/24/2022   ALKPHOS 91 11/24/2022   BILITOT 0.3 11/24/2022   Lab Results  Component Value Date   HGBA1C 5.4 11/24/2022   HGBA1C  5.5 07/21/2022   HGBA1C 5.3 02/08/2022   HGBA1C 5.4 09/30/2021   HGBA1C 5.6 06/01/2021   Lab Results  Component Value Date   INSULIN 23.0 11/24/2022   INSULIN 12.8 07/21/2022   INSULIN 25.4 (H) 02/08/2022   INSULIN 36.4 (H) 09/30/2021   INSULIN 138.0 (H) 06/01/2021   Lab Results  Component Value Date   TSH 1.070 02/08/2022   Lab Results  Component Value Date   CHOL 220 (H) 02/08/2022   HDL 32 (L) 02/08/2022   LDLCALC 170 (H) 02/08/2022   LDLDIRECT 141 (H) 12/11/2012   TRIG 98 02/08/2022   CHOLHDL 6.9 (H) 02/08/2022   Lab Results  Component Value Date   VD25OH 26.0 (L) 11/24/2022   VD25OH 32.8 07/21/2022   VD25OH 45.0 02/08/2022   Lab Results  Component Value Date   WBC 6.0 09/22/2022   HGB 14.1 09/22/2022   HCT 42.9 09/22/2022   MCV 83 09/22/2022   PLT 355 08/10/2020   No results found for: "IRON", "TIBC", "FERRITIN"  Attestation Statements:   Reviewed by clinician on day of visit: allergies, medications, problem list, medical history, surgical history, family history, social history, and previous encounter notes.  I have reviewed the above documentation for accuracy and completeness, and I agree with the above. -  Cammy Sanjurjo d. Shyenne Maggard, NP-C

## 2023-03-10 DIAGNOSIS — M9903 Segmental and somatic dysfunction of lumbar region: Secondary | ICD-10-CM | POA: Diagnosis not present

## 2023-03-10 DIAGNOSIS — M9902 Segmental and somatic dysfunction of thoracic region: Secondary | ICD-10-CM | POA: Diagnosis not present

## 2023-03-10 DIAGNOSIS — M9904 Segmental and somatic dysfunction of sacral region: Secondary | ICD-10-CM | POA: Diagnosis not present

## 2023-03-10 DIAGNOSIS — M9901 Segmental and somatic dysfunction of cervical region: Secondary | ICD-10-CM | POA: Diagnosis not present

## 2023-03-24 ENCOUNTER — Encounter: Payer: Self-pay | Admitting: Allergy

## 2023-03-24 ENCOUNTER — Ambulatory Visit (INDEPENDENT_AMBULATORY_CARE_PROVIDER_SITE_OTHER): Payer: Federal, State, Local not specified - PPO | Admitting: Allergy

## 2023-03-24 VITALS — BP 150/90 | HR 76 | Temp 99.0°F | Resp 20

## 2023-03-24 DIAGNOSIS — J310 Chronic rhinitis: Secondary | ICD-10-CM

## 2023-03-24 DIAGNOSIS — H1013 Acute atopic conjunctivitis, bilateral: Secondary | ICD-10-CM

## 2023-03-24 DIAGNOSIS — R030 Elevated blood-pressure reading, without diagnosis of hypertension: Secondary | ICD-10-CM | POA: Diagnosis not present

## 2023-03-24 DIAGNOSIS — L508 Other urticaria: Secondary | ICD-10-CM

## 2023-03-24 MED ORDER — OLOPATADINE HCL 0.2 % OP SOLN: 1.0000 [drp] | Freq: Every day | OPHTHALMIC | 50 refills | Status: DC | PRN

## 2023-03-24 MED ORDER — FAMOTIDINE 20 MG PO TABS: 20.0000 mg | ORAL_TABLET | Freq: Two times a day (BID) | ORAL | 5 refills | Status: AC

## 2023-03-24 MED ORDER — RYALTRIS 665-25 MCG/ACT NA SUSP: 2.0000 | Freq: Two times a day (BID) | NASAL | 5 refills | Status: DC | PRN

## 2023-03-24 MED ORDER — CETIRIZINE HCL 10 MG PO TABS: 10.0000 mg | ORAL_TABLET | Freq: Two times a day (BID) | ORAL | 5 refills | Status: AC

## 2023-03-24 NOTE — Patient Instructions (Addendum)
Chronic hives - at this time etiology of hives are cold-induced which is usually triggered by exposure in cool/cold bodies of water.  Cold-induced hives can lead to anaphylaxis and thus having epipen is important.  Hives can be caused by a variety of different triggers including illness/infection, foods, medications, stings, exercise, pressure, vibrations, extremes of temperature to name a few however majority of the time there is no identifiable trigger.   - your hive work-up was reassuring - in anticipation of summer and swimming would start antihistamine regimen about a week prior to your planned trips/plans to go swimming with Zyrtec 1 tab 1-2 times a day with Pepcid 1 tab 1-2 times a day depending on symptoms.  - if hives are not controlled with Zyrtec and Pepcid at twice a day dosing then would recommend starting Xolair monthly injections for added control.  Will discuss this option in more detail if needed - recommend you have access to epipen in case of more severe reaction with hives.  Emergency action plan provided to follow in case of allergic reaction.    Rhinoconjunctivitis - environmental allergy testing was negative however you have symptoms consistent with allergic rhinoconjunctivitis - would start antihistamine like Zyrtec as above daily at this time - use Ryaltris nasal sprays 2 sprays each nostril twice a day as needed for runny or stuffy nose.  If effective this goes to a specialty pharmacy and gets mailed to you - use Pataday 1 drop each eye daily as needed for itchy/watery eyes  Elevated Blood pressure - recommend discussing with your primary care about blood pressure control - if possible recommend home blood pressure checks as well  Follow-up 6 months or sooner if needed

## 2023-03-24 NOTE — Progress Notes (Signed)
Follow-up Note  RE: Shawn [name] MRN: [id number] DOB: 1986-11-12 Date of Office Visit: 03/24/2023   History of present illness: Shawn [name] is a 37 y.o. male presenting today for follow-up of chronic urticaria.  He was last seen in the office on 09/22/22 by myself.  He has not had any hives since the last visit however he has not had any exposures to bodies of water for swimming yet.  He does have several vacations planned this summer to go to the beach. He states with the pollen onset he has had to take more allergy medication.  He is noticing runny nose, stuffy nose, sore throat, coughing.  He also feels like something is in both eyes that he can't flush out.  This is also causing red eyes as well as stringy goopiness.  He has been taking benadryl and store-brand antihistamine as well as has tried alka-seltzer cold.  He has also used nasal spray like flonase.     Review of systems: Review of Systems  Constitutional: Negative.   HENT:         See HPI  Eyes:        See HPI  Respiratory: Negative.    Cardiovascular: Negative.   Musculoskeletal: Negative.   Skin: Negative.   Allergic/Immunologic: Negative.   Neurological: Negative.      All other systems negative unless noted above in HPI  Past medical/social/surgical/family history have been reviewed and are unchanged unless specifically indicated below.  No changes  Medication List: Current Outpatient Medications  Medication Sig Dispense Refill   Acetaminophen (TYLENOL PO) Take by mouth.     AMBULATORY NON FORMULARY MEDICATION Massage therapy to manage chronic back pain.  Treat as needed 1 each 0   buPROPion (WELLBUTRIN XL) 300 MG 24 hr tablet Take 1 tablet (300 mg total) by mouth daily. 90 tablet 1   cyclobenzaprine (FLEXERIL) 10 MG tablet Take 1 tablet (10 mg total) by mouth 3 (three) times daily as needed for muscle spasms. 90 tablet 2   econazole nitrate 1 % cream Apply 1 Application topically daily.      EPINEPHrine (EPIPEN 2-PAK) 0.3 mg/0.3 mL IJ SOAJ injection Inject 0.3 mg into the muscle as needed for anaphylaxis. 2 each 1   finasteride (PROSCAR) 5 MG tablet Take 5 mg by mouth daily.     hydrochlorothiazide (HYDRODIURIL) 25 MG tablet Take 1 tablet (25 mg total) by mouth daily. 90 tablet 3   hydrocortisone 2.5 % cream Apply topically.     ketoconazole (NIZORAL) 2 % shampoo Apply topically 2 (two) times a week.     lidocaine (LIDODERM) 5 % Place 1 patch onto the skin daily. Remove & Discard patch within 12 hours or as directed by MD 30 patch 12   lisinopril (ZESTRIL) 40 MG tablet TAKE 1 TABLET BY MOUTH EVERY DAY 90 tablet 1   minoxidil (LONITEN) 2.5 MG tablet Take 2.5 mg by mouth daily.     omeprazole (PRILOSEC) 40 MG capsule Take 1 capsule (40 mg total) by mouth daily. 90 capsule 1   Semaglutide-Weight Management (WEGOVY) 2.4 MG/0.75ML SOAJ Inject 2.4 mg into the skin once a week. 3 mL 0   Vitamin D, Ergocalciferol, (DRISDOL) 1.25 MG (50000 UNIT) CAPS capsule TAKE ONE CAPSULE BY MOUTH ONCE A WEEK FOR 12 WEEKS. 8 capsule 0   No current facility-administered medications for this visit.     Known medication allergies: No Known Allergies   Physical examination: Blood pressure (!) 150/90,  pulse 76, temperature 99 F (37.2 C), temperature source Temporal, resp. rate 20, SpO2 96 %.  General: Alert, interactive, in no acute distress. HEENT: PERRLA with injected conjunctivae bilaterally, TMs pearly gray, turbinates moderately edematous without discharge, post-pharynx non erythematous. Neck: Supple without lymphadenopathy. Lungs: Clear to auscultation without wheezing, rhonchi or rales. {no increased work of breathing. CV: Normal S1, S2 without murmurs. Abdomen: Nondistended, nontender. Skin: Warm and dry, without lesions or rashes. Extremities:  No clubbing, cyanosis or edema. Neuro:   Grossly intact.  Diagnositics/Labs: Labs:  Component     Latest Ref Rng 09/22/2022  D Pteronyssinus  IgE     Class 0 kU/L <0.10   D Farinae IgE     Class 0 kU/L <0.10   Cat Dander IgE     Class 0 kU/L <0.10   Dog Dander IgE     Class 0 kU/L <0.10   [place] Grass IgE     Class 0 kU/L <0.10   Timothy Grass IgE     Class 0 kU/L <0.10   [place] Grass IgE     Class 0 kU/L <0.10   Cockroach, German IgE     Class 0 kU/L <0.10   Penicillium Chrysogen IgE     Class 0 kU/L <0.10   Cladosporium Herbarum IgE     Class 0 kU/L <0.10   Aspergillus Fumigatus IgE     Class 0 kU/L <0.10   Alternaria Alternata IgE     Class 0 kU/L <0.10   Maple/Box Elder IgE     Class 0 kU/L <0.10   Common Silver Charletta Cousin IgE     Class 0 kU/L <0.10   Cedar, [place] IgE     Class 0 kU/L <0.10   Oak, White IgE     Class 0 kU/L <0.10   Elm, American IgE     Class 0 kU/L <0.10   C[place]wood IgE     Class 0 kU/L <0.10   Pecan, Hickory IgE     Class 0 kU/L <0.10   White Mulberry IgE     Class 0 kU/L <0.10   Ragweed, Short IgE     Class 0 kU/L <0.10   Pigweed, Rough IgE     Class 0 kU/L <0.10   Sheep Sorrel IgE Qn     Class 0 kU/L <0.10   Mouse Urine IgE     Class 0 kU/L <0.10   WBC     3.4 - 10.8 x10E3/uL 6.0   RBC     4.14 - 5.80 x10E6/uL 5.16   Hemoglobin     13.0 - 17.7 g/dL 19.1   HCT     47.8 - 29.5 % 42.9   MCV     79 - 97 fL 83   MCH     26.6 - 33.0 pg 27.3   MCHC     31.5 - 35.7 g/dL 62.1   RDW     30.8 - 65.7 % 13.5   Neutrophils     Not Estab. % 50   Lymphs     Not Estab. % 37   Monocytes     Not Estab. % 8   Eos     Not Estab. % 3   Basos     Not Estab. % 1   NEUT#     1.4 - 7.0 x10E3/uL 3.1   Lymphocyte #     0.7 - 3.1 x10E3/uL 2.2   Monocytes Absolute     0.1 -  0.9 x10E3/uL 0.5   EOS (ABSOLUTE)     0.0 - 0.4 x10E3/uL 0.2   Basophils Absolute     0.0 - 0.2 x10E3/uL 0.0   Immature Granulocytes     Not Estab. % 1   Immature Grans (Abs)     0.0 - 0.1 x10E3/uL 0.0   Class Description Allergens Comment   IgE (Immunoglobulin E), Serum     6 - 495 IU/mL 13    [id number] Alpha-Gal     Class 0 kU/L <0.10   Beef IgE     Class 0 kU/L <0.10   Pork IgE     Class 0 kU/L <0.10   Allergen Lamb IgE     Class 0 kU/L <0.10   Thyroperoxidase Ab SerPl-aCnc     0 - 34 IU/mL <9   Thyroglobulin Antibody     0.0 - 0.9 IU/mL <1.0   cu index     <10  <1.0   Tryptase     2.2 - 13.2 ug/L 8.9     Assessment and plan:   Chronic urticaria - at this time etiology of hives are cold-induced which is usually triggered by exposure in cool/cold bodies of water.  Cold-induced hives can lead to anaphylaxis and thus having epipen is important.  Hives can be caused by a variety of different triggers including illness/infection, foods, medications, stings, exercise, pressure, vibrations, extremes of temperature to name a few however majority of the time there is no identifiable trigger.   - your hive work-up was reassuring - in anticipation of summer and swimming would start antihistamine regimen about a week prior to your planned trips/plans to go swimming with Zyrtec 1 tab 1-2 times a day with Pepcid 1 tab 1-2 times a day depending on symptoms.  - if hives are not controlled with Zyrtec and Pepcid at twice a day dosing then would recommend starting Xolair monthly injections for added control.  Will discuss this option in more detail if needed - recommend you have access to epipen in case of more severe reaction with hives.  Emergency action plan provided to follow in case of allergic reaction.    Rhinoconjunctivitis - environmental allergy testing was negative however you have symptoms consistent with allergic rhinoconjunctivitis - would start antihistamine like Zyrtec as above daily at this time - use Ryaltris nasal sprays 2 sprays each nostril twice a day as needed for runny or stuffy nose.  If effective this goes to a specialty pharmacy and gets mailed to you - use Pataday 1 drop each eye daily as needed for itchy/watery eyes  Elevated Blood pressure - recommend  discussing with your primary care about blood pressure control - if possible recommend home blood pressure checks as well  Follow-up 6 months or sooner if needed  I appreciate the opportunity to take part in Shawn [name]'s care. Please do not hesitate to contact me with questions.  Sincerely,   Margo Aye, MD Allergy/Immunology [facility]

## 2023-03-30 ENCOUNTER — Ambulatory Visit (INDEPENDENT_AMBULATORY_CARE_PROVIDER_SITE_OTHER): Payer: Federal, State, Local not specified - PPO | Admitting: Adult Health

## 2023-03-30 ENCOUNTER — Encounter (INDEPENDENT_AMBULATORY_CARE_PROVIDER_SITE_OTHER): Payer: Self-pay | Admitting: Adult Health

## 2023-03-30 VITALS — BP 113/77 | HR 71 | Temp 98.2°F | Ht 70.0 in | Wt 358.0 lb

## 2023-03-30 DIAGNOSIS — Z6841 Body Mass Index (BMI) 40.0 and over, adult: Secondary | ICD-10-CM

## 2023-03-30 DIAGNOSIS — K219 Gastro-esophageal reflux disease without esophagitis: Secondary | ICD-10-CM | POA: Diagnosis not present

## 2023-03-30 DIAGNOSIS — E669 Obesity, unspecified: Secondary | ICD-10-CM

## 2023-03-30 DIAGNOSIS — R7301 Impaired fasting glucose: Secondary | ICD-10-CM | POA: Diagnosis not present

## 2023-03-30 MED ORDER — WEGOVY 2.4 MG/0.75ML ~~LOC~~ SOAJ: 2.4000 mg | SUBCUTANEOUS | 0 refills | Status: DC

## 2023-03-30 NOTE — Progress Notes (Signed)
WEIGHT SUMMARY AND BIOMETRICS  Vitals Temp: 98.2 F (36.8 C) BP: 113/77 Pulse Rate: 71 SpO2: 99 %   Anthropometric Measurements Height: 5\' 10"  (1.778 m) Weight: (!) 358 lb (162.4 kg) BMI (Calculated): 51.37 Weight at Last Visit: 369lb Weight Lost Since Last Visit: 11lb Weight Gained Since Last Visit: 0 Starting Weight: 392lb Total Weight Loss (lbs): 34 lb (15.4 kg)   Body Composition  Body Fat %: 39.8 % Fat Mass (lbs): 142.4 lbs Muscle Mass (lbs): 205.2 lbs Total Body Water (lbs): 145.6 lbs Visceral Fat Rating : 27   Other Clinical Data Fasting: no Labs: no Today's Visit #: 31 Starting Date: 08/10/20    Chief Complaint:   OBESITY Shawn [name] is here to discuss his progress with his obesity treatment plan. He is on the the Category 4 Plan and states he is following his eating plan approximately 0 % of the time.  He states he is not currently exercising.   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 He has been acutely ill with URI since 03/17/23  Hunger/appetite-Decreased appetite sine URI sx's. Sleep- HJe has been experiencing more fatigue and sleeping and napping more frequently since recent URI. Exercise-he has not exercised sinec win at [employer] coim Hydration-he has been only sipping on fluids since recent allergy flare.  Subjective:   1. Gastroesophageal reflux disease without esophagitis He endorses significant increase in acid reflux sx's the last two weeks.  2. IFG (impaired fasting glucose) 03/02/2023 Wegocy increase from 1.7mg  to 2.4mg  once weekly injection. Denies mass in neck, dysphagia,persistent hoarseness, abdominal pain, or N/V/C  He reports increase in dyspepsia, however he has hx of GERD. He has been off his   Assessment/Plan:   1. Gastroesophageal reflux disease without esophagitis omeprazole (PRILOSEC) 40 MG capsule Take 1 capsule (40 mg total) by mouth daily. Dispense: 90  capsule, Refills: 1 ordered    famotidine (PEPCID) 20 MG tablet Take 1 tablet (20 mg total) by mouth 2 (two) times daily. Dispense: 60 tablet, Refills: 5 ordered    If dyspepsia does not improve by next OV, recommend decreasing GLP-1 dose  2. IFG (impaired fasting glucose) Consume higher protein foods, ie Progressive High Protein Soup, [place] Yogurt, Eggs, [employer]  3. Obesity with current BMI of 51.37  4. Starting BMI 56.25 Semaglutide-Weight Management (WEGOVY) 2.4 MG/0.75ML SOAJ Inject 2.4 mg into the skin once a week. Dispense: 3 mL, Refills: 0 ordered   Ren is currently in the action stage of change. As such, his goal is to continue with weight loss efforts. He has agreed to the Category 4 Plan.   Exercise goals: All adults should avoid inactivity. Some physical activity is better than none, and adults who participate in any amount of physical activity gain some health benefits.  Behavioral modification strategies: increasing lean protein intake, decreasing simple carbohydrates, increasing vegetables, increasing water intake, no skipping meals, and meal planning and cooking strategies.  Iokepa has agreed to follow-up with our clinic in 4 weeks. He was informed of the importance of frequent follow-up visits to maximize his success with intensive lifestyle modifications for his multiple health conditions.  \ Objective:   Blood pressure 113/77, pulse 71, temperature 98.2 F (36.8 C), height 5\' 10"  (1.778 m), weight (!) 358 lb (162.4 kg), SpO2 99 %. Body mass index is 51.37 kg/m.  General: Cooperative, alert, well developed, in no acute distress. HEENT: Conjunctivae and lids unremarkable. Cardiovascular: Regular rhythm.  Lungs: Normal work of breathing. Neurologic: No focal deficits.   Lab Results  Component Value Date   CREATININE 1.13 11/24/2022   BUN 14 11/24/2022   NA 140 11/24/2022   K 4.8 11/24/2022   CL 100 11/24/2022   CO2 27 11/24/2022   Lab Results   Component Value Date   ALT 15 11/24/2022   AST 17 11/24/2022   ALKPHOS 91 11/24/2022   BILITOT 0.3 11/24/2022   Lab Results  Component Value Date   HGBA1C 5.4 11/24/2022   HGBA1C 5.5 07/21/2022   HGBA1C 5.3 02/08/2022   HGBA1C 5.4 09/30/2021   HGBA1C 5.6 06/01/2021   Lab Results  Component Value Date   INSULIN 23.0 11/24/2022   INSULIN 12.8 07/21/2022   INSULIN 25.4 (H) 02/08/2022   INSULIN 36.4 (H) 09/30/2021   INSULIN 138.0 (H) 06/01/2021   Lab Results  Component Value Date   TSH 1.070 02/08/2022   Lab Results  Component Value Date   CHOL 220 (H) 02/08/2022   HDL 32 (L) 02/08/2022   LDLCALC 170 (H) 02/08/2022   LDLDIRECT 141 (H) 12/11/2012   TRIG 98 02/08/2022   CHOLHDL 6.9 (H) 02/08/2022   Lab Results  Component Value Date   VD25OH 26.0 (L) 11/24/2022   VD25OH 32.8 07/21/2022   VD25OH 45.0 02/08/2022   Lab Results  Component Value Date   WBC 6.0 09/22/2022   HGB 14.1 09/22/2022   HCT 42.9 09/22/2022   MCV 83 09/22/2022   PLT 355 08/10/2020   No results found for: "IRON", "TIBC", "FERRITIN"  Attestation Statements:   Reviewed by clinician on day of visit: allergies, medications, problem list, medical history, surgical history, family history, social history, and previous encounter notes.  I have reviewed the above documentation for accuracy and completeness, and I agree with the above. -  Lokelani Lutes d. Kollins Fenter, NP-C

## 2023-04-01 ENCOUNTER — Other Ambulatory Visit: Payer: Self-pay | Admitting: Family

## 2023-05-03 ENCOUNTER — Encounter (INDEPENDENT_AMBULATORY_CARE_PROVIDER_SITE_OTHER): Payer: Self-pay | Admitting: Adult Health

## 2023-05-03 ENCOUNTER — Ambulatory Visit (INDEPENDENT_AMBULATORY_CARE_PROVIDER_SITE_OTHER): Payer: Federal, State, Local not specified - PPO | Admitting: Adult Health

## 2023-05-03 VITALS — BP 119/78 | HR 83 | Temp 98.1°F | Ht 70.0 in | Wt 361.0 lb

## 2023-05-03 DIAGNOSIS — R7301 Impaired fasting glucose: Secondary | ICD-10-CM | POA: Diagnosis not present

## 2023-05-03 DIAGNOSIS — E559 Vitamin D deficiency, unspecified: Secondary | ICD-10-CM

## 2023-05-03 DIAGNOSIS — E669 Obesity, unspecified: Secondary | ICD-10-CM | POA: Diagnosis not present

## 2023-05-03 DIAGNOSIS — I10 Essential (primary) hypertension: Secondary | ICD-10-CM | POA: Diagnosis not present

## 2023-05-03 DIAGNOSIS — Z6841 Body Mass Index (BMI) 40.0 and over, adult: Secondary | ICD-10-CM

## 2023-05-03 MED ORDER — WEGOVY 2.4 MG/0.75ML ~~LOC~~ SOAJ: 2.4000 mg | SUBCUTANEOUS | 0 refills | Status: DC

## 2023-05-03 NOTE — Progress Notes (Signed)
WEIGHT SUMMARY AND BIOMETRICS  Vitals Temp: 98.1 F (36.7 C) BP: 119/78 Pulse Rate: 83 SpO2: 98 %   Anthropometric Measurements Height: 5\' 10"  (1.778 m) Weight: (!) 361 lb (163.7 kg) BMI (Calculated): 51.8 Weight at Last Visit: 358 lb Weight Lost Since Last Visit: 0 Weight Gained Since Last Visit: 3 lb Starting Weight: 392 lb Total Weight Loss (lbs): 31 lb (14.1 kg) Peak Weight: 405 lb   Body Composition  Body Fat %: 42 % Fat Mass (lbs): 151.6 lbs Muscle Mass (lbs): 199.2 lbs Total Body Water (lbs): 150.2 lbs Visceral Fat Rating : 28   Other Clinical Data Fasting: no Labs: no Today's Visit #: 32 Starting Date: 08/10/20    Chief Complaint:   OBESITY Shawn [name] is here to discuss his progress with his obesity treatment plan. He is on the the Category 4 Plan and states he is following his eating plan approximately 50 % of the time. He states he is exercising YardWork/Walking 60 min/ 1-1.5 miles 3-4 times per week.   Interim History:  He has been on max dose Wegovy 2.4mg  since 03/02/2023  He recently travelled to [place] and would walk >13K steps/day.  He was mindful of portion size and increased protein at meals.  He did enjoy deserts while in [place]- denies GI upset with high sugar/CHO intake.  Subjective:   1. IFG (impaired fasting glucose)  Latest Reference Range & Units 09/10/21 09:59 02/08/22 15:11 07/21/22 09:33 11/24/22 11:07  Glucose 70 - 99 mg/dL 94 81 83 81    Latest Reference Range & Units 09/30/21 15:06 02/08/22 15:11 07/21/22 09:33 11/24/22 11:07  INSULIN 2.6 - 24.9 uIU/mL 36.4 (H) 25.4 (H) 12.8 23.0  (H): Data is abnormally high  He is weekly Wegovyh 2.4mg  Denies mass in neck, dysphagia, dyspepsia, persistent hoarseness, abdominal pain, or N/V/C   2. Essential hypertension minoxidil (LONITEN) 2.5 MG tablet  lisinopril (ZESTRIL) 40 MG tablet    hydrochlorothiazide (HYDRODIURIL) 25 MG tablet  BP at goal at OV He reports 100%  with antihypertensive therapy  3. Vitamin D deficiency  Latest Reference Range & Units 09/30/21 15:06 02/08/22 15:11 07/21/22 09:33 11/24/22 11:07  Vitamin D, 25-Hydroxy 30.0 - 100.0 ng/mL 33.9 45.0 32.8 26.0 (L)  (L): Data is abnormally low He is on weekly Ergocalciferol- denies N/V/Muscle Weakness  Assessment/Plan:   1. IFG (impaired fasting glucose) Check Labs - Hemoglobin A1c  2. Essential hypertension Continue with minoxidil (LONITEN) 2.5 MG tablet  lisinopril (ZESTRIL) 40 MG tablet    hydrochlorothiazide (HYDRODIURIL) 25 MG tablet   3. Vitamin D deficiency Check Labs - VITAMIN D 25 Hydroxy (Vit-D Deficiency, Fractures) Refill Ergocalciferol after labs  4. Obesity with current BMI of 51.8  Refill  Semaglutide-Weight Management (WEGOVY) 2.4 MG/0.75ML SOAJ Inject 2.4 mg into the skin once a week. Dispense: 3 mL, Refills: 0 ordered   [name] is currently in the action stage of change. As such, his goal is to continue with weight loss efforts. He has agreed to the Category 4 Plan.   Exercise goals: For substantial health benefits, adults should do at least 150 minutes (2 hours and 30 minutes) a week of moderate-intensity, or 75 minutes (1 hour and 15 minutes) a week of vigorous-intensity aerobic physical activity, or an equivalent combination of moderate- and vigorous-intensity aerobic activity. Aerobic activity should be performed in episodes of at least 10 minutes, and preferably, it should be spread throughout the week.  Behavioral modification strategies: increasing lean protein intake, decreasing simple  carbohydrates, increasing vegetables, increasing water intake, no skipping meals, meal planning and cooking strategies, better snacking choices, travel eating strategies, planning for success, and decreasing junk food.  [name] has agreed to follow-up with our clinic in 4 weeks. He was informed of the importance of frequent follow-up visits to maximize his success with  intensive lifestyle modifications for his multiple health conditions.   Check Fasting Labs at next OV  Objective:   Blood pressure 119/78, pulse 83, temperature 98.1 F (36.7 C), height 5\' 10"  (1.778 m), weight (!) 361 lb (163.7 kg), SpO2 98 %. Body mass index is 51.8 kg/m.  General: Cooperative, alert, well developed, in no acute distress. HEENT: Conjunctivae and lids unremarkable. Cardiovascular: Regular rhythm.  Lungs: Normal work of breathing. Neurologic: No focal deficits.   Lab Results  Component Value Date   CREATININE 1.13 11/24/2022   BUN 14 11/24/2022   NA 140 11/24/2022   K 4.8 11/24/2022   CL 100 11/24/2022   CO2 27 11/24/2022   Lab Results  Component Value Date   ALT 15 11/24/2022   AST 17 11/24/2022   ALKPHOS 91 11/24/2022   BILITOT 0.3 11/24/2022   Lab Results  Component Value Date   HGBA1C 5.4 11/24/2022   HGBA1C 5.5 07/21/2022   HGBA1C 5.3 02/08/2022   HGBA1C 5.4 09/30/2021   HGBA1C 5.6 06/01/2021   Lab Results  Component Value Date   INSULIN 23.0 11/24/2022   INSULIN 12.8 07/21/2022   INSULIN 25.4 (H) 02/08/2022   INSULIN 36.4 (H) 09/30/2021   INSULIN 138.0 (H) 06/01/2021   Lab Results  Component Value Date   TSH 1.070 02/08/2022   Lab Results  Component Value Date   CHOL 220 (H) 02/08/2022   HDL 32 (L) 02/08/2022   LDLCALC 170 (H) 02/08/2022   LDLDIRECT 141 (H) 12/11/2012   TRIG 98 02/08/2022   CHOLHDL 6.9 (H) 02/08/2022   Lab Results  Component Value Date   VD25OH 26.0 (L) 11/24/2022   VD25OH 32.8 07/21/2022   VD25OH 45.0 02/08/2022   Lab Results  Component Value Date   WBC 6.0 09/22/2022   HGB 14.1 09/22/2022   HCT 42.9 09/22/2022   MCV 83 09/22/2022   PLT 355 08/10/2020   No results found for: "IRON", "TIBC", "FERRITIN"  Attestation Statements:   Reviewed by clinician on day of visit: allergies, medications, problem list, medical history, surgical history, family history, social history, and previous encounter  notes.  I have reviewed the above documentation for accuracy and completeness, and I agree with the above. -  Caelie [place] d. Rogue Pautler,  NP-C

## 2023-05-04 LAB — HEMOGLOBIN A1C
Est. average glucose Bld gHb Est-mCnc: 111 mg/dL
Hgb A1c MFr Bld: 5.5 % (ref 4.8–5.6)

## 2023-05-04 LAB — VITAMIN D 25 HYDROXY (VIT D DEFICIENCY, FRACTURES): Vit D, 25-Hydroxy: 29.7 ng/mL — ABNORMAL LOW (ref 30.0–100.0)

## 2023-06-06 ENCOUNTER — Telehealth (INDEPENDENT_AMBULATORY_CARE_PROVIDER_SITE_OTHER): Payer: Self-pay | Admitting: Adult Health

## 2023-06-08 ENCOUNTER — Ambulatory Visit (INDEPENDENT_AMBULATORY_CARE_PROVIDER_SITE_OTHER): Payer: Federal, State, Local not specified - PPO | Admitting: Adult Health

## 2023-06-14 ENCOUNTER — Ambulatory Visit (INDEPENDENT_AMBULATORY_CARE_PROVIDER_SITE_OTHER): Payer: Federal, State, Local not specified - PPO | Admitting: Adult Health

## 2023-06-14 ENCOUNTER — Encounter (INDEPENDENT_AMBULATORY_CARE_PROVIDER_SITE_OTHER): Payer: Self-pay | Admitting: Adult Health

## 2023-06-14 VITALS — BP 154/83 | HR 76 | Temp 97.8°F | Ht 70.0 in | Wt 358.0 lb

## 2023-06-14 DIAGNOSIS — E559 Vitamin D deficiency, unspecified: Secondary | ICD-10-CM | POA: Diagnosis not present

## 2023-06-14 DIAGNOSIS — R7301 Impaired fasting glucose: Secondary | ICD-10-CM | POA: Diagnosis not present

## 2023-06-14 DIAGNOSIS — I10 Essential (primary) hypertension: Secondary | ICD-10-CM

## 2023-06-14 DIAGNOSIS — Z6841 Body Mass Index (BMI) 40.0 and over, adult: Secondary | ICD-10-CM

## 2023-06-14 MED ORDER — VITAMIN D (ERGOCALCIFEROL) 1.25 MG (50000 UNIT) PO CAPS
ORAL_CAPSULE | ORAL | 0 refills | Status: DC
Start: 2023-06-14 — End: 2023-08-17

## 2023-06-14 MED ORDER — VITAMIN D (ERGOCALCIFEROL) 1.25 MG (50000 UNIT) PO CAPS
ORAL_CAPSULE | ORAL | 0 refills | Status: DC
Start: 2023-06-14 — End: 2023-06-14

## 2023-06-14 MED ORDER — WEGOVY 2.4 MG/0.75ML ~~LOC~~ SOAJ: 2.4000 mg | SUBCUTANEOUS | 0 refills | Status: DC

## 2023-06-14 NOTE — Progress Notes (Signed)
WEIGHT SUMMARY AND BIOMETRICS  Vitals Temp: 97.8 F (36.6 C) BP: (!) 154/83 Pulse Rate: 76 SpO2: 98 %   Anthropometric Measurements Height: 5\' 10"  (1.778 m) Weight: (!) 358 lb (162.4 kg) BMI (Calculated): 51.37 Weight at Last Visit: 361lb Weight Lost Since Last Visit: 3lb Weight Gained Since Last Visit: 0 Starting Weight: 392lb Total Weight Loss (lbs): 34 lb (15.4 kg) Peak Weight: 405lb   Body Composition  Body Fat %: 41.7 % Fat Mass (lbs): 149.2 lbs Muscle Mass (lbs): 198.6 lbs Total Body Water (lbs): 149 lbs Visceral Fat Rating : 28   Other Clinical Data Fasting: no Labs: yes Today's Visit #: 91 Starting Date: 08/10/20    Chief Complaint:   OBESITY Shawn [name] is here to discuss his progress with his obesity treatment plan. He is on the the Category 4 Plan and states he is following his eating plan approximately 40 % of the time. He states he is exercising Walking, YardWork 30-60 minutes 2 times per week.   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  Hydration-he estimates to drink 40-50 oz water/day  He will experience breakthrough polyphagia despite max dose Wegovy 2.4mg  weekly injection  Subjective:   1. Essential hypertension BP above goal He did NOT take prescribed antihypertensive therapy- common noncompliance issue for him. He denies CP/dyspnea at present. He is currently prescribed minoxidil (LONITEN) 2.5 MG tablet  lisinopril (ZESTRIL) 40 MG tablet  EPINEPHrine (EPIPEN 2-PAK) 0.3 mg/0.3 mL IJ SOAJ injection  hydrochlorothiazide (HYDRODIURIL) 25 MG tablet    2. IFG (impaired fasting glucose) Discussed Labs  Latest Reference Range & Units 07/21/22 09:33 11/24/22 11:07  Glucose 70 - 99 mg/dL 83 81   Lab Results  Component Value Date   HGBA1C 5.5 05/03/2023   HGBA1C 5.4 11/24/2022   HGBA1C 5.5 07/21/2022   A1c at goal He is on weekly Wegovy 2.4mg  Denies mass in neck,  dysphagia, dyspepsia, persistent hoarseness, abdominal pain, or N/V/C   3. Vitamin D deficiency Discussed Labs  Latest Reference Range & Units 11/24/22 11:07 05/03/23 15:38  Vitamin D, 25-Hydroxy 30.0 - 100.0 ng/mL 26.0 (L) 29.7 (L)  (L): Data is abnormally low Level subtherapeutic despite weekly Ergocalciferol  Assessment/Plan:   1. Essential hypertension Take all antihypertensives DAILY  2. IFG (impaired fasting glucose) Continue Wegovy therapy  3. Vitamin D deficiency Refill and increase Vitamin D, Ergocalciferol, (DRISDOL) 1.25 MG (50000 UNIT) CAPS capsule TAKE ONE CAPSULE BY MOUTH ONCE A EVERY 3 DAYS Dispense: 12 capsule, Refills: 0 ordered   4. Obesity with current BMI of 51.37 Refill Semaglutide-Weight Management (WEGOVY) 2.4 MG/0.75ML SOAJ Inject 2.4 mg into the skin once a week. Dispense: 3 mL, Refills: 0 ordered   Josejuan is currently in the action stage of change. As such, his goal is to get back to weightloss efforts . He has agreed to the Category 4 Plan.   Exercise goals: For substantial health benefits, adults should do at least 150 minutes (2 hours and 30 minutes) a week of moderate-intensity, or 75 minutes (1 hour and 15 minutes) a week of vigorous-intensity aerobic physical activity, or an equivalent combination of moderate- and vigorous-intensity aerobic activity. Aerobic activity should be performed in episodes of at least 10 minutes, and preferably, it should be spread throughout the week.  Behavioral modification strategies: increasing lean protein intake, decreasing simple carbohydrates, increasing vegetables, increasing water intake, meal planning and cooking strategies, keeping healthy foods in  the home, ways to avoid boredom eating, ways to avoid night time snacking, and planning for success.  Tavion has agreed to follow-up with our clinic in 4 weeks. He was informed of the importance of frequent follow-up visits to maximize his success with intensive  lifestyle modifications for his multiple health conditions.   Check Fasting Labs at next OV  Objective:   Blood pressure (!) 154/83, pulse 76, temperature 97.8 F (36.6 C), height 5\' 10"  (1.778 m), weight (!) 358 lb (162.4 kg), SpO2 98%. Body mass index is 51.37 kg/m.  General: Cooperative, alert, well developed, in no acute distress. HEENT: Conjunctivae and lids unremarkable. Cardiovascular: Regular rhythm.  Lungs: Normal work of breathing. Neurologic: No focal deficits.   Lab Results  Component Value Date   CREATININE 1.13 11/24/2022   BUN 14 11/24/2022   NA 140 11/24/2022   K 4.8 11/24/2022   CL 100 11/24/2022   CO2 27 11/24/2022   Lab Results  Component Value Date   ALT 15 11/24/2022   AST 17 11/24/2022   ALKPHOS 91 11/24/2022   BILITOT 0.3 11/24/2022   Lab Results  Component Value Date   HGBA1C 5.5 05/03/2023   HGBA1C 5.4 11/24/2022   HGBA1C 5.5 07/21/2022   HGBA1C 5.3 02/08/2022   HGBA1C 5.4 09/30/2021   Lab Results  Component Value Date   INSULIN 23.0 11/24/2022   INSULIN 12.8 07/21/2022   INSULIN 25.4 (H) 02/08/2022   INSULIN 36.4 (H) 09/30/2021   INSULIN 138.0 (H) 06/01/2021   Lab Results  Component Value Date   TSH 1.070 02/08/2022   Lab Results  Component Value Date   CHOL 220 (H) 02/08/2022   HDL 32 (L) 02/08/2022   LDLCALC 170 (H) 02/08/2022   LDLDIRECT 141 (H) 12/11/2012   TRIG 98 02/08/2022   CHOLHDL 6.9 (H) 02/08/2022   Lab Results  Component Value Date   VD25OH 29.7 (L) 05/03/2023   VD25OH 26.0 (L) 11/24/2022   VD25OH 32.8 07/21/2022   Lab Results  Component Value Date   WBC 6.0 09/22/2022   HGB 14.1 09/22/2022   HCT 42.9 09/22/2022   MCV 83 09/22/2022   PLT 355 08/10/2020   No results found for: "IRON", "TIBC", "FERRITIN"  Attestation Statements:   Reviewed by clinician on day of visit: allergies, medications, problem list, medical history, surgical history, family history, social history, and previous encounter  notes.  I have reviewed the above documentation for accuracy and completeness, and I agree with the above. -  Margarite Vessel d. Leimomi Zervas, NP-C

## 2023-07-04 ENCOUNTER — Other Ambulatory Visit: Payer: Self-pay | Admitting: Family

## 2023-07-04 DIAGNOSIS — F32A Depression, unspecified: Secondary | ICD-10-CM

## 2023-07-12 ENCOUNTER — Ambulatory Visit (INDEPENDENT_AMBULATORY_CARE_PROVIDER_SITE_OTHER): Payer: Federal, State, Local not specified - PPO | Admitting: Adult Health

## 2023-07-12 ENCOUNTER — Encounter (INDEPENDENT_AMBULATORY_CARE_PROVIDER_SITE_OTHER): Payer: Self-pay | Admitting: Adult Health

## 2023-07-12 VITALS — BP 134/80 | HR 85 | Temp 98.4°F | Ht 70.0 in | Wt 352.0 lb

## 2023-07-12 DIAGNOSIS — E559 Vitamin D deficiency, unspecified: Secondary | ICD-10-CM | POA: Diagnosis not present

## 2023-07-12 DIAGNOSIS — R7301 Impaired fasting glucose: Secondary | ICD-10-CM

## 2023-07-12 DIAGNOSIS — E669 Obesity, unspecified: Secondary | ICD-10-CM

## 2023-07-12 DIAGNOSIS — E66813 Obesity, class 3: Secondary | ICD-10-CM

## 2023-07-12 DIAGNOSIS — I10 Essential (primary) hypertension: Secondary | ICD-10-CM

## 2023-07-12 DIAGNOSIS — E88819 Insulin resistance, unspecified: Secondary | ICD-10-CM | POA: Diagnosis not present

## 2023-07-12 DIAGNOSIS — Z6841 Body Mass Index (BMI) 40.0 and over, adult: Secondary | ICD-10-CM

## 2023-07-12 MED ORDER — WEGOVY 2.4 MG/0.75ML ~~LOC~~ SOAJ: 2.4000 mg | SUBCUTANEOUS | 0 refills | Status: DC

## 2023-07-12 NOTE — Progress Notes (Signed)
WEIGHT SUMMARY AND BIOMETRICS  Vitals Temp: 98.4 F (36.9 C) BP: 134/80 Pulse Rate: 85 SpO2: 97 %   Anthropometric Measurements Height: 5\' 10"  (1.778 m) Weight: (!) 352 lb (159.7 kg) BMI (Calculated): 50.51 Weight at Last Visit: 358lb Weight Lost Since Last Visit: 6lb Weight Gained Since Last Visit: 0 Starting Weight: 392lb Total Weight Loss (lbs): 40 lb (18.1 kg) Peak Weight: 405lb   Body Composition  Body Fat %: 40.2 % Fat Mass (lbs): 141.6 lbs Muscle Mass (lbs): 200.6 lbs Total Body Water (lbs): 139.2 lbs Visceral Fat Rating : 26   Other Clinical Data Fasting: yes Labs: yes Today's Visit #: 34 Starting Date: 08/10/20    Chief Complaint:   OBESITY Shawn [name] is here to discuss his progress with his obesity treatment plan. He is on the the Category 4 Plan and states he is following his eating plan approximately 45 % of the time. He states he is not been exercising due to hectic schedule.   Interim History:  He and his girlfriend (maybe a few others) will travel to [facility] for long weekend to celebrate his birthday! Happy 37th!  Reviewed Bioempedence results with pt: Muscle Mass: +2 lbs Adipose Mass: -7.6 lbs  Subjective:   1. Insulin resistance  Latest Reference Range & Units 07/21/22 09:33 11/24/22 11:07  INSULIN 2.6 - 24.9 uIU/mL 12.8 23.0    2. IFG (impaired fasting glucose)  Latest Reference Range & Units 02/08/22 15:11 07/21/22 09:33 11/24/22 11:07  Glucose 70 - 99 mg/dL 81 83 81  07/19/5620 Wegocy increase from 1.7mg  to 2.4mg  once weekly injection. Denies mass in neck, dysphagia,persistent hoarseness, abdominal pain, or N/V/C  GERD stable  3. Essential hypertension His girlfriend will check his BP at home, however unsure of readings. PCP manages minoxidil (LONITEN) 2.5 MG tablet  lisinopril (ZESTRIL) 40 MG tablet  EPINEPHrine (EPIPEN 2-PAK) 0.3 mg/0.3 mL IJ SOAJ injection  hydrochlorothiazide (HYDRODIURIL) 25 MG tablet    4.  Vitamin D deficiency Discussed Labs  Latest Reference Range & Units 05/03/23 15:38  Vitamin D, 25-Hydroxy 30.0 - 100.0 ng/mL 29.7 (L)  (L): Data is abnormally low  He has been taking Ergocalciferol once weekly not twice weekly as ordered- as insurance would not fill full Rx order   Assessment/Plan:   1. Insulin resistance Check Labs - Insulin, random  2. IFG (impaired fasting glucose) Check Labs  3. Essential hypertension Check Labs - Comprehensive metabolic panel  4. Vitamin D deficiency Increase Ergocalciferol to twice weekly as ordered  5. Obesity with current BMI of 50.51  Shawn [name] is currently in the action stage of change. As such, his goal is to continue with weight loss efforts. He has agreed to the Category 4 Plan.   Exercise goals: For substantial health benefits, adults should do at least 150 minutes (2 hours and 30 minutes) a week of moderate-intensity, or 75 minutes (1 hour and 15 minutes) a week of vigorous-intensity aerobic physical activity, or an equivalent combination of moderate- and vigorous-intensity aerobic activity. Aerobic activity should be performed in episodes of at least 10 minutes, and preferably, it should be spread throughout the week.  Behavioral modification strategies: increasing lean protein intake, decreasing simple carbohydrates, increasing vegetables, increasing water intake, no skipping meals, meal planning and cooking strategies, keeping healthy foods in the home, and planning for success.  [name] has agreed to follow-up with our clinic in 4 weeks. He was informed of the importance of frequent follow-up visits to maximize his success  with intensive lifestyle modifications for his multiple health conditions.   Objective:   Blood pressure 134/80, pulse 85, temperature 98.4 F (36.9 C), height 5\' 10"  (1.778 m), weight (!) 352 lb (159.7 kg), SpO2 97%. Body mass index is 50.51 kg/m.  General: Cooperative, alert, well developed, in no  acute distress. HEENT: Conjunctivae and lids unremarkable. Cardiovascular: Regular rhythm.  Lungs: Normal work of breathing. Neurologic: No focal deficits.   Lab Results  Component Value Date   CREATININE 1.13 11/24/2022   BUN 14 11/24/2022   NA 140 11/24/2022   K 4.8 11/24/2022   CL 100 11/24/2022   CO2 27 11/24/2022   Lab Results  Component Value Date   ALT 15 11/24/2022   AST 17 11/24/2022   ALKPHOS 91 11/24/2022   BILITOT 0.3 11/24/2022   Lab Results  Component Value Date   HGBA1C 5.5 05/03/2023   HGBA1C 5.4 11/24/2022   HGBA1C 5.5 07/21/2022   HGBA1C 5.3 02/08/2022   HGBA1C 5.4 09/30/2021   Lab Results  Component Value Date   INSULIN 23.0 11/24/2022   INSULIN 12.8 07/21/2022   INSULIN 25.4 (H) 02/08/2022   INSULIN 36.4 (H) 09/30/2021   INSULIN 138.0 (H) 06/01/2021   Lab Results  Component Value Date   TSH 1.070 02/08/2022   Lab Results  Component Value Date   CHOL 220 (H) 02/08/2022   HDL 32 (L) 02/08/2022   LDLCALC 170 (H) 02/08/2022   LDLDIRECT 141 (H) 12/11/2012   TRIG 98 02/08/2022   CHOLHDL 6.9 (H) 02/08/2022   Lab Results  Component Value Date   VD25OH 29.7 (L) 05/03/2023   VD25OH 26.0 (L) 11/24/2022   VD25OH 32.8 07/21/2022   Lab Results  Component Value Date   WBC 6.0 09/22/2022   HGB 14.1 09/22/2022   HCT 42.9 09/22/2022   MCV 83 09/22/2022   PLT 355 08/10/2020   No results found for: "IRON", "TIBC", "FERRITIN"  Attestation Statements:   Reviewed by clinician on day of visit: allergies, medications, problem list, medical history, surgical history, family history, social history, and previous encounter notes.  I have reviewed the above documentation for accuracy and completeness, and I agree with the above. -  Hudson Majkowski d. Kitt Ledet, NP-C

## 2023-07-13 LAB — COMPREHENSIVE METABOLIC PANEL
ALT: 13 IU/L (ref 0–44)
AST: 18 IU/L (ref 0–40)
Albumin: 4.6 g/dL (ref 4.1–5.1)
Alkaline Phosphatase: 98 IU/L (ref 44–121)
BUN/Creatinine Ratio: 11 (ref 9–20)
BUN: 14 mg/dL (ref 6–20)
Bilirubin Total: 0.3 mg/dL (ref 0.0–1.2)
CO2: 27 mmol/L (ref 20–29)
Calcium: 9.6 mg/dL (ref 8.7–10.2)
Chloride: 97 mmol/L (ref 96–106)
Creatinine, Ser: 1.32 mg/dL — ABNORMAL HIGH (ref 0.76–1.27)
Globulin, Total: 3.2 g/dL (ref 1.5–4.5)
Glucose: 77 mg/dL (ref 70–99)
Potassium: 4.7 mmol/L (ref 3.5–5.2)
Sodium: 139 mmol/L (ref 134–144)
Total Protein: 7.8 g/dL (ref 6.0–8.5)
eGFR: 73 mL/min/{1.73_m2} (ref >59)

## 2023-07-13 LAB — INSULIN, RANDOM: INSULIN: 16.9 u[IU]/mL (ref 2.6–24.9)

## 2023-07-25 ENCOUNTER — Encounter (INDEPENDENT_AMBULATORY_CARE_PROVIDER_SITE_OTHER): Payer: Self-pay | Admitting: Adult Health

## 2023-08-10 NOTE — Telephone Encounter (Signed)
error 

## 2023-08-17 ENCOUNTER — Ambulatory Visit (INDEPENDENT_AMBULATORY_CARE_PROVIDER_SITE_OTHER): Payer: Federal, State, Local not specified - PPO | Admitting: Adult Health

## 2023-08-17 ENCOUNTER — Encounter (INDEPENDENT_AMBULATORY_CARE_PROVIDER_SITE_OTHER): Payer: Self-pay | Admitting: Adult Health

## 2023-08-17 VITALS — BP 112/64 | HR 102 | Temp 98.1°F | Ht 70.0 in | Wt 357.0 lb

## 2023-08-17 DIAGNOSIS — E669 Obesity, unspecified: Secondary | ICD-10-CM | POA: Diagnosis not present

## 2023-08-17 DIAGNOSIS — Z6841 Body Mass Index (BMI) 40.0 and over, adult: Secondary | ICD-10-CM

## 2023-08-17 DIAGNOSIS — E88819 Insulin resistance, unspecified: Secondary | ICD-10-CM

## 2023-08-17 DIAGNOSIS — E559 Vitamin D deficiency, unspecified: Secondary | ICD-10-CM

## 2023-08-17 DIAGNOSIS — R7989 Other specified abnormal findings of blood chemistry: Secondary | ICD-10-CM

## 2023-08-17 MED ORDER — WEGOVY 2.4 MG/0.75ML ~~LOC~~ SOAJ
2.4000 mg | SUBCUTANEOUS | 0 refills | Status: DC
Start: 2023-08-17 — End: 2023-09-21

## 2023-08-17 MED ORDER — VITAMIN D (ERGOCALCIFEROL) 1.25 MG (50000 UNIT) PO CAPS
ORAL_CAPSULE | ORAL | 0 refills | Status: DC
Start: 2023-08-17 — End: 2023-09-21

## 2023-08-17 NOTE — Progress Notes (Signed)
WEIGHT SUMMARY AND BIOMETRICS  Vitals Temp: 98.1 F (36.7 C) BP: 112/64 Pulse Rate: (!) 102 SpO2: 98 %   Anthropometric Measurements Height: 5\' 10"  (1.778 m) Weight: (!) 357 lb (161.9 kg) BMI (Calculated): 51.22 Weight at Last Visit: 352 lb Weight Lost Since Last Visit: 0 Weight Gained Since Last Visit: 5 lb Starting Weight: 392 lb Total Weight Loss (lbs): 35 lb (15.9 kg) Peak Weight: 405 lb   Body Composition  Body Fat %: 40.9 % Fat Mass (lbs): 146 lbs Muscle Mass (lbs): 200.8 lbs Total Body Water (lbs): 143 lbs Visceral Fat Rating : 27   Other Clinical Data Fasting: no Labs: no Today's Visit #: 35 Starting Date: 08/10/20    Chief Complaint:   OBESITY Shawn [name] is here to discuss his progress with his obesity treatment plan. He is on the the Category 4 Plan and states he is following his eating plan approximately 0 % of the time. He states he is exercising Walking and Yard Work 1-3 hrs 2 times per week.   Interim History:  His girlfriend surprised him with [place] 37th Birthday party! They then travelled to [facility] for long weekend. He consumed oysters while at [employer] and suffered food poisoning for 24 hrs afterwards. He denies current GI upset.  03/02/2023- when he started max dose weekly Wegovy 2.4mg  Denies mass in neck, dysphagia, dyspepsia, persistent hoarseness, abdominal pain, or N/V/C   Subjective:   1. Elevated serum creatinine Discussed Labs  Latest Reference Range & Units 11/24/22 11:07 07/12/23 10:09  Creatinine 0.76 - 1.27 mg/dL 8.41 6.60 (H)  (H): Data is abnormally high  Worsened He estimates 20 oz water/day BP at goal today, however multiple previous readings in EPIC well above goal  2. Insulin resistance Discussed Labs  Latest Reference Range & Units 05/03/23 15:38 07/12/23 10:09  Glucose 70 - 99 mg/dL  77  Hemoglobin Y3K 4.8 - 5.6 % 5.5   Est. average glucose Bld gHb Est-mCnc mg/dL 160   INSULIN 2.6 - 10.9 uIU/mL   16.9   CBG at goal A1c at goal Insulin level improved, still above goal  03/02/2023- when he started max dose weekly Wegovy 2.4mg  Denies mass in neck, dysphagia, dyspepsia, persistent hoarseness, abdominal pain, or N/V/C   3. Vitamin D deficiency Discussed Labs  Latest Reference Range & Units 05/03/23 15:38  Vitamin D, 25-Hydroxy 30.0 - 100.0 ng/mL 29.7 (L)  (L): Data is abnormally low  Below goal He is on bi-weekly Ergocalciferol- denies N/V/Muscle Weakness  Assessment/Plan:   1. Elevated serum creatinine Avoid Nephrotoxic substances Increase water intake, at least 100 oz water/day Take antihypertensive therapy DAILY  2. Insulin resistance Reduce sugar/simple CHO and increase protein Increase cardiovascular exercise  3. Vitamin D deficiency Refill - Vitamin D, Ergocalciferol, (DRISDOL) 1.25 MG (50000 UNIT) CAPS capsule; TAKE ONE CAPSULE BY MOUTH ONCE A EVERY 3 DAYS  Dispense: 12 capsule; Refill: 0  5. Obesity with current BMI of 51.2 Refill - Semaglutide-Weight Management (WEGOVY) 2.4 MG/0.75ML SOAJ; Inject 2.4 mg into the skin once a week.  Dispense: 3 mL; Refill: 0  Shawn [name] is not currently in the action stage of change. As such, his goal is to get back to weightloss efforts . He has agreed to the Category 4 Plan.   Exercise goals: For substantial health benefits, adults should do at least 150 minutes (2 hours and 30 minutes) a week of moderate-intensity, or 75 minutes (1 hour and 15 minutes) a week of vigorous-intensity aerobic physical  activity, or an equivalent combination of moderate- and vigorous-intensity aerobic activity. Aerobic activity should be performed in episodes of at least 10 minutes, and preferably, it should be spread throughout the week.  Behavioral modification strategies: increasing lean protein intake, decreasing simple carbohydrates, increasing vegetables, increasing water intake, decreasing eating out, no skipping meals, meal planning and cooking  strategies, and planning for success.  Shawn [name] has agreed to follow-up with our clinic in 4 weeks. He was informed of the importance of frequent follow-up visits to maximize his success with intensive lifestyle modifications for his multiple health conditions.   Objective:   Blood pressure 112/64, pulse (!) 102, temperature 98.1 F (36.7 C), height 5\' 10"  (1.778 m), weight (!) 357 lb (161.9 kg), SpO2 98%. Body mass index is 51.22 kg/m.  General: Cooperative, alert, well developed, in no acute distress. HEENT: Conjunctivae and lids unremarkable. Cardiovascular: Regular rhythm.  Lungs: Normal work of breathing. Neurologic: No focal deficits.   Lab Results  Component Value Date   CREATININE 1.32 (H) 07/12/2023   BUN 14 07/12/2023   NA 139 07/12/2023   K 4.7 07/12/2023   CL 97 07/12/2023   CO2 27 07/12/2023   Lab Results  Component Value Date   ALT 13 07/12/2023   AST 18 07/12/2023   ALKPHOS 98 07/12/2023   BILITOT 0.3 07/12/2023   Lab Results  Component Value Date   HGBA1C 5.5 05/03/2023   HGBA1C 5.4 11/24/2022   HGBA1C 5.5 07/21/2022   HGBA1C 5.3 02/08/2022   HGBA1C 5.4 09/30/2021   Lab Results  Component Value Date   INSULIN 16.9 07/12/2023   INSULIN 23.0 11/24/2022   INSULIN 12.8 07/21/2022   INSULIN 25.4 (H) 02/08/2022   INSULIN 36.4 (H) 09/30/2021   Lab Results  Component Value Date   TSH 1.070 02/08/2022   Lab Results  Component Value Date   CHOL 220 (H) 02/08/2022   HDL 32 (L) 02/08/2022   LDLCALC 170 (H) 02/08/2022   LDLDIRECT 141 (H) 12/11/2012   TRIG 98 02/08/2022   CHOLHDL 6.9 (H) 02/08/2022   Lab Results  Component Value Date   VD25OH 29.7 (L) 05/03/2023   VD25OH 26.0 (L) 11/24/2022   VD25OH 32.8 07/21/2022   Lab Results  Component Value Date   WBC 6.0 09/22/2022   HGB 14.1 09/22/2022   HCT 42.9 09/22/2022   MCV 83 09/22/2022   PLT 355 08/10/2020   No results found for: "IRON", "TIBC", "FERRITIN"  Attestation Statements:    Reviewed by clinician on day of visit: allergies, medications, problem list, medical history, surgical history, family history, social history, and previous encounter notes.  I have reviewed the above documentation for accuracy and completeness, and I agree with the above. -  Shawn [name] d. Krystal Teachey, NP-C

## 2023-08-30 ENCOUNTER — Ambulatory Visit (INDEPENDENT_AMBULATORY_CARE_PROVIDER_SITE_OTHER): Payer: Federal, State, Local not specified - PPO | Admitting: Family

## 2023-08-30 ENCOUNTER — Encounter: Payer: Self-pay | Admitting: Family

## 2023-08-30 ENCOUNTER — Ambulatory Visit: Payer: Federal, State, Local not specified - PPO | Admitting: Family

## 2023-08-30 VITALS — BP 146/86 | HR 84 | Temp 98.0°F | Ht 70.0 in | Wt 368.0 lb

## 2023-08-30 DIAGNOSIS — K219 Gastro-esophageal reflux disease without esophagitis: Secondary | ICD-10-CM

## 2023-08-30 DIAGNOSIS — F32A Depression, unspecified: Secondary | ICD-10-CM

## 2023-08-30 DIAGNOSIS — I10 Essential (primary) hypertension: Secondary | ICD-10-CM | POA: Diagnosis not present

## 2023-08-30 DIAGNOSIS — Z0001 Encounter for general adult medical examination with abnormal findings: Secondary | ICD-10-CM

## 2023-08-30 DIAGNOSIS — E782 Mixed hyperlipidemia: Secondary | ICD-10-CM | POA: Diagnosis not present

## 2023-08-30 DIAGNOSIS — Z0000 Encounter for general adult medical examination without abnormal findings: Secondary | ICD-10-CM | POA: Diagnosis not present

## 2023-08-30 DIAGNOSIS — R234 Changes in skin texture: Secondary | ICD-10-CM | POA: Diagnosis not present

## 2023-08-30 DIAGNOSIS — F419 Anxiety disorder, unspecified: Secondary | ICD-10-CM

## 2023-08-30 MED ORDER — CLOTRIMAZOLE-BETAMETHASONE 1-0.05 % EX CREA
1.0000 | TOPICAL_CREAM | Freq: Every day | CUTANEOUS | 1 refills | Status: AC
Start: 2023-08-30 — End: ?

## 2023-08-30 MED ORDER — LISINOPRIL 40 MG PO TABS: 40.0000 mg | ORAL_TABLET | Freq: Every day | ORAL | 1 refills | Status: DC

## 2023-08-30 MED ORDER — HYDROCHLOROTHIAZIDE 25 MG PO TABS: 25.0000 mg | ORAL_TABLET | Freq: Every day | ORAL | 3 refills | Status: DC

## 2023-08-30 MED ORDER — OMEPRAZOLE 40 MG PO CPDR: 40.0000 mg | DELAYED_RELEASE_CAPSULE | Freq: Every day | ORAL | 1 refills | Status: AC

## 2023-08-30 NOTE — Assessment & Plan Note (Signed)
chronic taking Wellbutrin 300mg  qd, does not take every day continue to advise med is not prn, need to take at least 2-4 weeks for best results pt will call if refill needed f/u 6 mos

## 2023-08-30 NOTE — Assessment & Plan Note (Signed)
chronic last checked 01/2022, TC 220, LDL 170 pt reports no changes in diet morbidly obese, not exercising continue to advise on low saturated fat diet, restart exercise routine rechecking lipids today, non-fasting f/u 28yr

## 2023-08-30 NOTE — Assessment & Plan Note (Addendum)
chronic, BP elevated today taking Lisinopril 40mg  & HCTZ 25mg  qd not taking as directed, last refilled 07/2022 x 97m refilling both meds today CMP wnl 06/2023 continue to advise on importance of med adherence f/u 6 mos

## 2023-08-30 NOTE — Assessment & Plan Note (Signed)
chronic taking Prilosec 40mg  qd, but not daily pt still on Wegovy 2.4mg  refilling Prilosec f/u 6 mos

## 2023-08-30 NOTE — Patient Instructions (Addendum)
It was very nice to see you today!   I will review your lab results via MyChart in a few days. I have sent your med refills to your pharmacy. Be sure to take your blood pressure meds every day! You are a little high today. Exercise will condition your heart and help blood pressure as well, also continued weight loss, but need the meds for now. I will research the dry skin around your mouth and let you know what I find out.       PLEASE NOTE:  If you had any lab tests please let us know if you have not heard back within a few days. You may see your results on MyChart before we have a chance to review them but we will give you a call once they are reviewed by [place]. If we ordered any referrals today, please let us know if you have not heard from their office within the next week.
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
## 2023-08-31 LAB — CBC WITH DIFFERENTIAL/PLATELET
Basophils Absolute: 0 10*3/uL (ref 0.0–0.1)
Basophils Relative: 0.7 % (ref 0.0–3.0)
Eosinophils Absolute: 0.2 10*3/uL (ref 0.0–0.7)
Eosinophils Relative: 3.7 % (ref 0.0–5.0)
HCT: 43.6 % (ref 39.0–52.0)
Hemoglobin: 14.6 g/dL (ref 13.0–17.0)
Lymphocytes Relative: 39.6 % (ref 12.0–46.0)
Lymphs Abs: 2.6 10*3/uL (ref 0.7–4.0)
MCHC: 33.5 g/dL (ref 30.0–36.0)
MCV: 82.8 fL (ref 78.0–100.0)
Monocytes Absolute: 0.5 10*3/uL (ref 0.1–1.0)
Monocytes Relative: 7.8 % (ref 3.0–12.0)
Neutro Abs: 3.2 10*3/uL (ref 1.4–7.7)
Neutrophils Relative %: 48.2 % (ref 43.0–77.0)
Platelets: 395 10*3/uL (ref 150.0–400.0)
RBC: 5.27 Mil/uL (ref 4.22–5.81)
RDW: 13.9 % (ref 11.5–15.5)
WBC: 6.7 10*3/uL (ref 4.0–10.5)

## 2023-08-31 LAB — LIPID PANEL
Cholesterol: 216 mg/dL — ABNORMAL HIGH (ref 0–200)
HDL: 39.4 mg/dL (ref >39.00)
LDL Cholesterol: 154 mg/dL — ABNORMAL HIGH (ref 0–99)
NonHDL: 176.82
Total CHOL/HDL Ratio: 5
Triglycerides: 116 mg/dL (ref 0.0–149.0)
VLDL: 23.2 mg/dL (ref 0.0–40.0)

## 2023-09-07 ENCOUNTER — Ambulatory Visit: Payer: Federal, State, Local not specified - PPO | Admitting: Family Medicine

## 2023-09-07 ENCOUNTER — Ambulatory Visit: Payer: Federal, State, Local not specified - PPO

## 2023-09-07 ENCOUNTER — Other Ambulatory Visit: Payer: Self-pay

## 2023-09-07 ENCOUNTER — Encounter: Payer: Self-pay | Admitting: Family Medicine

## 2023-09-07 VITALS — BP 132/88 | HR 102 | Ht 70.0 in | Wt 368.0 lb

## 2023-09-07 DIAGNOSIS — M25562 Pain in left knee: Secondary | ICD-10-CM | POA: Diagnosis not present

## 2023-09-07 DIAGNOSIS — M25561 Pain in right knee: Secondary | ICD-10-CM | POA: Diagnosis not present

## 2023-09-07 DIAGNOSIS — M1711 Unilateral primary osteoarthritis, right knee: Secondary | ICD-10-CM | POA: Diagnosis not present

## 2023-09-07 DIAGNOSIS — G8929 Other chronic pain: Secondary | ICD-10-CM

## 2023-09-07 DIAGNOSIS — M546 Pain in thoracic spine: Secondary | ICD-10-CM

## 2023-09-07 MED ORDER — NAPROXEN 500 MG PO TABS: 500.0000 mg | ORAL_TABLET | Freq: Two times a day (BID) | ORAL | 3 refills | Status: AC | PRN
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
## 2023-09-07 NOTE — Patient Instructions (Addendum)
Thank you for coming in today.   Please get an Xray today before you leave   Please use Voltaren gel (Generic Diclofenac Gel) up to 4x daily for pain as needed.  This is available over-the-counter as both the name brand Voltaren gel and the generic diclofenac gel.   Use the naproxen I prescribed as needed.   Please complete the exercises that the athletic trainer went over with you:  View at [email or web address] using code: [email or web address]  You should hear from Dr Kathlene Cote office shortly about other procedures for back pain.

## 2023-09-08 NOTE — Progress Notes (Signed)
Right knee x-ray shows some arthritis in the knee joint.

## 2023-09-14 ENCOUNTER — Ambulatory Visit (INDEPENDENT_AMBULATORY_CARE_PROVIDER_SITE_OTHER): Payer: Federal, State, Local not specified - PPO | Admitting: Adult Health

## 2023-09-15 ENCOUNTER — Encounter: Payer: Self-pay | Admitting: Family Medicine

## 2023-09-18 NOTE — Telephone Encounter (Signed)
Forwarding to Dr. Corey.  

## 2023-09-19 MED ORDER — CYCLOBENZAPRINE HCL 10 MG PO TABS: 10.0000 mg | ORAL_TABLET | Freq: Three times a day (TID) | ORAL | 2 refills | Status: AC | PRN

## 2023-09-21 ENCOUNTER — Ambulatory Visit (INDEPENDENT_AMBULATORY_CARE_PROVIDER_SITE_OTHER): Payer: Federal, State, Local not specified - PPO | Admitting: Adult Health

## 2023-09-21 ENCOUNTER — Encounter (INDEPENDENT_AMBULATORY_CARE_PROVIDER_SITE_OTHER): Payer: Self-pay | Admitting: Adult Health

## 2023-09-21 VITALS — BP 126/81 | HR 77 | Temp 98.1°F | Ht 70.0 in | Wt 359.0 lb

## 2023-09-21 DIAGNOSIS — E669 Obesity, unspecified: Secondary | ICD-10-CM | POA: Diagnosis not present

## 2023-09-21 DIAGNOSIS — E88819 Insulin resistance, unspecified: Secondary | ICD-10-CM

## 2023-09-21 DIAGNOSIS — E559 Vitamin D deficiency, unspecified: Secondary | ICD-10-CM

## 2023-09-21 DIAGNOSIS — Z6841 Body Mass Index (BMI) 40.0 and over, adult: Secondary | ICD-10-CM

## 2023-09-21 DIAGNOSIS — E782 Mixed hyperlipidemia: Secondary | ICD-10-CM | POA: Diagnosis not present

## 2023-09-21 DIAGNOSIS — E66813 Obesity, class 3: Secondary | ICD-10-CM

## 2023-09-21 MED ORDER — WEGOVY 2.4 MG/0.75ML ~~LOC~~ SOAJ
2.4000 mg | SUBCUTANEOUS | 0 refills | Status: DC
Start: 2023-09-21 — End: 2023-10-18

## 2023-09-21 MED ORDER — VITAMIN D (ERGOCALCIFEROL) 1.25 MG (50000 UNIT) PO CAPS: ORAL_CAPSULE | ORAL | 0 refills | Status: DC

## 2023-09-21 NOTE — Progress Notes (Signed)
WEIGHT SUMMARY AND BIOMETRICS  Vitals Temp: 98.1 F (36.7 C) BP: 126/81 Pulse Rate: 77 SpO2: 97 %   Anthropometric Measurements Height: 5\' 10"  (1.778 m) Weight: (!) 359 lb (162.8 kg) BMI (Calculated): 51.51 Weight at Last Visit: 357lb Weight Lost Since Last Visit: 0 Weight Gained Since Last Visit: 2lb Starting Weight: 392lb Total Weight Loss (lbs): 33 lb (15 kg) Peak Weight: 405lb Waist Measurement : 0 inches   Body Composition  Body Fat %: 40.9 % Fat Mass (lbs): 147 lbs Muscle Mass (lbs): 202.2 lbs Total Body Water (lbs): 145 lbs Visceral Fat Rating : 28   Other Clinical Data A1c: 0 mg/dL RMR: 0 Fasting: no Labs: no Today's Visit #: 36 Starting Date: 08/10/20    Chief Complaint:   OBESITY Shawn [name] is here to discuss his progress with his obesity treatment plan. He is on the the Category 4 Plan and states he is following his eating plan approximately 35 % of the time. He states he is exercising Yard Work 120 minutes 3 times per week.   Interim History:  Blood Pressure at goal at OV!  Hunger/appetite-stable appetite, denies polyphagia- on max dose weekly Wegovy 2.4mg   Stress- he endorses stable mood  Exercise-Yard/House work- getting yard ready for re-seeding  Subjective:   1. Vitamin D deficiency  Latest Reference Range & Units 07/21/22 09:33 11/24/22 11:07 05/03/23 15:38  Vitamin D, 25-Hydroxy 30.0 - 100.0 ng/mL 32.8 26.0 (L) 29.7 (L)  (L): Data is abnormally low  He is on bi-weekly Ergocalciferol- denies N/V/Muscle Weakness  2. Mixed hyperlipidemia Lipid Panel     Component Value Date/Time   CHOL 216 (H) 08/30/2023 1625   CHOL 220 (H) 02/08/2022 1519   TRIG 116.0 08/30/2023 1625   HDL 39.40 08/30/2023 1625   HDL 32 (L) 02/08/2022 1519   CHOLHDL 5 08/30/2023 1625   VLDL 23.2 08/30/2023 1625   LDLCALC 154 (H) 08/30/2023 1625   LDLCALC 170 (H) 02/08/2022 1519   LDLDIRECT 141 (H) 12/11/2012 1624   LABVLDL 18 02/08/2022 1519   The  ASCVD Risk score (Arnett DK, et al., 2019) failed to calculate for the following reasons:   The 2019 ASCVD risk score is only valid for ages 93 to 74   He is not on statin therapy He is on weekly Wegovy 2.4mg   3. Insulin resistance He is on max dose Wegovy 2.4mg  once weekly injection He denies polyphagia Denies mass in neck, dysphagia, dyspepsia, persistent hoarseness, abdominal pain, or N/V/C    Assessment/Plan:   1. Vitamin D deficiency Refill - Vitamin D, Ergocalciferol, (DRISDOL) 1.25 MG (50000 UNIT) CAPS capsule; TAKE ONE CAPSULE BY MOUTH ONCE A EVERY 3 DAYS  Dispense: 12 capsule; Refill: 0  2. Mixed hyperlipidemia Continue reducing saturated fat intake and increasing regular activity.  3. Insulin resistance Continue to reduce sugar/simple CHO intake and increasing regular activity.  4. Obesity with current BMI of 51.51 Refill - Semaglutide-Weight Management (WEGOVY) 2.4 MG/0.75ML SOAJ; Inject 2.4 mg into the skin once a week.  Dispense: 3 mL; Refill: 0  Revis is not currently in the action stage of change. As such, his goal is to get back to weightloss efforts . He has agreed to the Category 4 Plan.   Exercise goals: For substantial health benefits, adults should do at least 150 minutes (2 hours and 30 minutes) a week of moderate-intensity, or 75 minutes (1 hour and 15 minutes) a week of vigorous-intensity aerobic physical activity, or an equivalent combination of  moderate- and vigorous-intensity aerobic activity. Aerobic activity should be performed in episodes of at least 10 minutes, and preferably, it should be spread throughout the week.  Behavioral modification strategies: increasing lean protein intake, decreasing simple carbohydrates, increasing vegetables, increasing water intake, no skipping meals, meal planning and cooking strategies, and planning for success.  Corby has agreed to follow-up with our clinic in 4 weeks. He was informed of the importance of  frequent follow-up visits to maximize his success with intensive lifestyle modifications for his multiple health conditions.   Check Fasting Labs in late Fall 2024  Objective:   Blood pressure 126/81, pulse 77, temperature 98.1 F (36.7 C), height 5\' 10"  (1.778 m), weight (!) 359 lb (162.8 kg), SpO2 97%. Body mass index is 51.51 kg/m.  General: Cooperative, alert, well developed, in no acute distress. HEENT: Conjunctivae and lids unremarkable. Cardiovascular: Regular rhythm.  Lungs: Normal work of breathing. Neurologic: No focal deficits.   Lab Results  Component Value Date   CREATININE 1.32 (H) 07/12/2023   BUN 14 07/12/2023   NA 139 07/12/2023   K 4.7 07/12/2023   CL 97 07/12/2023   CO2 27 07/12/2023   Lab Results  Component Value Date   ALT 13 07/12/2023   AST 18 07/12/2023   ALKPHOS 98 07/12/2023   BILITOT 0.3 07/12/2023   Lab Results  Component Value Date   HGBA1C 5.5 05/03/2023   HGBA1C 5.4 11/24/2022   HGBA1C 5.5 07/21/2022   HGBA1C 5.3 02/08/2022   HGBA1C 5.4 09/30/2021   Lab Results  Component Value Date   INSULIN 16.9 07/12/2023   INSULIN 23.0 11/24/2022   INSULIN 12.8 07/21/2022   INSULIN 25.4 (H) 02/08/2022   INSULIN 36.4 (H) 09/30/2021   Lab Results  Component Value Date   TSH 1.070 02/08/2022   Lab Results  Component Value Date   CHOL 216 (H) 08/30/2023   HDL 39.40 08/30/2023   LDLCALC 154 (H) 08/30/2023   LDLDIRECT 141 (H) 12/11/2012   TRIG 116.0 08/30/2023   CHOLHDL 5 08/30/2023   Lab Results  Component Value Date   VD25OH 29.7 (L) 05/03/2023   VD25OH 26.0 (L) 11/24/2022   VD25OH 32.8 07/21/2022   Lab Results  Component Value Date   WBC 6.7 08/30/2023   HGB 14.6 08/30/2023   HCT 43.6 08/30/2023   MCV 82.8 08/30/2023   PLT 395.0 08/30/2023   No results found for: "IRON", "TIBC", "FERRITIN"  Attestation Statements:   Reviewed by clinician on day of visit: allergies, medications, problem list, medical history, surgical  history, family history, social history, and previous encounter notes.  I have reviewed the above documentation for accuracy and completeness, and I agree with the above. -  Zani Kyllonen d. Jaquelynn Wanamaker, NP-C

## 2023-10-02 ENCOUNTER — Other Ambulatory Visit: Payer: Self-pay | Admitting: Allergy

## 2023-10-05 DIAGNOSIS — K080 Exfoliation of teeth due to systemic causes: Secondary | ICD-10-CM | POA: Diagnosis not present

## 2023-10-11 DIAGNOSIS — M546 Pain in thoracic spine: Secondary | ICD-10-CM | POA: Diagnosis not present

## 2023-10-11 DIAGNOSIS — G8929 Other chronic pain: Secondary | ICD-10-CM | POA: Diagnosis not present

## 2023-10-11 DIAGNOSIS — M791 Myalgia, unspecified site: Secondary | ICD-10-CM | POA: Diagnosis not present

## 2023-10-12 DIAGNOSIS — L649 Androgenic alopecia, unspecified: Secondary | ICD-10-CM | POA: Diagnosis not present

## 2023-10-12 DIAGNOSIS — L245 Irritant contact dermatitis due to other chemical products: Secondary | ICD-10-CM | POA: Diagnosis not present

## 2023-10-18 ENCOUNTER — Encounter (INDEPENDENT_AMBULATORY_CARE_PROVIDER_SITE_OTHER): Payer: Self-pay | Admitting: Adult Health

## 2023-10-18 ENCOUNTER — Ambulatory Visit (INDEPENDENT_AMBULATORY_CARE_PROVIDER_SITE_OTHER): Payer: Federal, State, Local not specified - PPO | Admitting: Adult Health

## 2023-10-18 VITALS — BP 131/84 | HR 73 | Temp 97.9°F | Ht 70.0 in | Wt 362.0 lb

## 2023-10-18 DIAGNOSIS — E559 Vitamin D deficiency, unspecified: Secondary | ICD-10-CM | POA: Diagnosis not present

## 2023-10-18 DIAGNOSIS — I10 Essential (primary) hypertension: Secondary | ICD-10-CM

## 2023-10-18 DIAGNOSIS — E66813 Obesity, class 3: Secondary | ICD-10-CM

## 2023-10-18 DIAGNOSIS — E88819 Insulin resistance, unspecified: Secondary | ICD-10-CM | POA: Diagnosis not present

## 2023-10-18 DIAGNOSIS — E669 Obesity, unspecified: Secondary | ICD-10-CM | POA: Diagnosis not present

## 2023-10-18 DIAGNOSIS — Z6841 Body Mass Index (BMI) 40.0 and over, adult: Secondary | ICD-10-CM

## 2023-10-18 MED ORDER — VITAMIN D (ERGOCALCIFEROL) 1.25 MG (50000 UNIT) PO CAPS: ORAL_CAPSULE | ORAL | 0 refills | Status: DC

## 2023-10-18 MED ORDER — WEGOVY 2.4 MG/0.75ML ~~LOC~~ SOAJ
2.4000 mg | SUBCUTANEOUS | 0 refills | Status: DC
Start: 2023-10-18 — End: 2023-11-23

## 2023-10-18 NOTE — Progress Notes (Signed)
WEIGHT SUMMARY AND BIOMETRICS  Vitals Temp: 97.9 F (36.6 C) BP: 131/84 Pulse Rate: 73 SpO2: 98 %   Anthropometric Measurements Height: 5\' 10"  (1.778 m) Weight: (!) 362 lb (164.2 kg) BMI (Calculated): 51.94 Weight at Last Visit: 359 lb Weight Lost Since Last Visit: 0 lb Weight Gained Since Last Visit: 3 lb Starting Weight: 392 lb Total Weight Loss (lbs): 31 lb (14.1 kg) Peak Weight: 405 lb   Body Composition  Body Fat %: 42 % Fat Mass (lbs): 152.2 lbs Muscle Mass (lbs): 199.8 lbs Total Body Water (lbs): 147.4 lbs Visceral Fat Rating : 29   Other Clinical Data Fasting: No Labs: No Today's Visit #: 37 Starting Date: 08/11/23    Chief Complaint:   OBESITY Shawn [name] is here to discuss his progress with his obesity treatment plan. He is on the the Category 4 Plan and states he is following his eating plan approximately 20 % of the time.  He states he is exercising Gym/Yard Work 40-45 minutes 2-3 times per week.   Interim History:  EPIC review  Dr. Lawson Radar started him weekly Ozempic 0.25mg  injection on/about 04/07/2023 He was converted to [facility] and has titrated up to max dose of 2.4mg   04/07/2023 Weight: 393 lbs with corresponding BMI 56.39  Today's Weight: 362 lbs with corresponding BMI 51.94  5% weight loss equated to 19.65 lb weight loss  He has in fact lost 31 lbs which is approximately an 8% weight loss  [employer] may cover gym membership fees- instructed to inquiry specifics from his insurance provider  He and his girlfriend will often eat out due to convenience  He estimates 70% of meals are brought into the home  Subjective:   1. Essential hypertension BP still above goal at OV He is on  minoxidil (LONITEN) 2.5 MG tablet  EPINEPHrine (EPIPEN 2-PAK) 0.3 mg/0.3 mL IJ SOAJ injection  hydrochlorothiazide (HYDRODIURIL) 25 MG tablet  lisinopril (ZESTRIL) 40 MG tablet   He reports missing antihypertensives yesterday  2. Insulin resistance  Latest  Reference Range & Units 09/30/21 15:06 02/08/22 15:11 07/21/22 09:33 11/24/22 11:07 07/12/23 10:09  INSULIN 2.6 - 24.9 uIU/mL 36.4 (H) 25.4 (H) 12.8 23.0 16.9  (H): Data is abnormally high  3. Vitamin D deficiency  Latest Reference Range & Units 07/21/22 09:33 11/24/22 11:07 05/03/23 15:38  Vitamin D, 25-Hydroxy 30.0 - 100.0 ng/mL 32.8 26.0 (L) 29.7 (L)  (L): Data is abnormally low  He is on bi-weekly Ergocalciferol- denies N/V/Muscle Weakness  Assessment/Plan:   1. Essential hypertension Consistently take ALL antihypertensives daily  2. Insulin resistance Increase plan compliance to at least 80%  3. Vitamin D deficiency Refill Vitamin D, Ergocalciferol, (DRISDOL) 1.25 MG (50000 UNIT) CAPS capsule TAKE ONE CAPSULE BY MOUTH ONCE A EVERY 3 DAYS Dispense: 12 capsule, Refills: 0 ordered   4. Obesity with current BMI of 52 Refill Semaglutide-Weight Management (WEGOVY) 2.4 MG/0.75ML SOAJ Inject 2.4 mg into the skin once a week. Dispense: 3 mL, Refills: 0 ordered   Shawn [name] is not currently in the action stage of change. As such, his goal is to get back to weightloss efforts . He has agreed to the Category 4 Plan.   Exercise goals: For substantial health benefits, adults should do at least 150 minutes (2 hours and 30 minutes) a week of moderate-intensity, or 75 minutes (1 hour and 15 minutes) a week of vigorous-intensity aerobic physical activity, or an equivalent combination of moderate- and vigorous-intensity aerobic activity. Aerobic activity should be performed  in episodes of at least 10 minutes, and preferably, it should be spread throughout the week.  Behavioral modification strategies: increasing lean protein intake, decreasing simple carbohydrates, increasing vegetables, increasing water intake, no skipping meals, meal planning and cooking strategies, keeping healthy foods in the home, holiday eating strategies , and planning for success.  Shawn [name] has agreed to follow-up with our  clinic in 4 weeks. He was informed of the importance of frequent follow-up visits to maximize his success with intensive lifestyle modifications for his multiple health conditions.   Check Fasting Labs at next OV  Objective:   Blood pressure 131/84, pulse 73, temperature 97.9 F (36.6 C), height 5\' 10"  (1.778 m), weight (!) 362 lb (164.2 kg), SpO2 98%. Body mass index is 51.94 kg/m.  General: Cooperative, alert, well developed, in no acute distress. HEENT: Conjunctivae and lids unremarkable. Cardiovascular: Regular rhythm.  Lungs: Normal work of breathing. Neurologic: No focal deficits.   Lab Results  Component Value Date   CREATININE 1.32 (H) 07/12/2023   BUN 14 07/12/2023   NA 139 07/12/2023   K 4.7 07/12/2023   CL 97 07/12/2023   CO2 27 07/12/2023   Lab Results  Component Value Date   ALT 13 07/12/2023   AST 18 07/12/2023   ALKPHOS 98 07/12/2023   BILITOT 0.3 07/12/2023   Lab Results  Component Value Date   HGBA1C 5.5 05/03/2023   HGBA1C 5.4 11/24/2022   HGBA1C 5.5 07/21/2022   HGBA1C 5.3 02/08/2022   HGBA1C 5.4 09/30/2021   Lab Results  Component Value Date   INSULIN 16.9 07/12/2023   INSULIN 23.0 11/24/2022   INSULIN 12.8 07/21/2022   INSULIN 25.4 (H) 02/08/2022   INSULIN 36.4 (H) 09/30/2021   Lab Results  Component Value Date   TSH 1.070 02/08/2022   Lab Results  Component Value Date   CHOL 216 (H) 08/30/2023   HDL 39.40 08/30/2023   LDLCALC 154 (H) 08/30/2023   LDLDIRECT 141 (H) 12/11/2012   TRIG 116.0 08/30/2023   CHOLHDL 5 08/30/2023   Lab Results  Component Value Date   VD25OH 29.7 (L) 05/03/2023   VD25OH 26.0 (L) 11/24/2022   VD25OH 32.8 07/21/2022   Lab Results  Component Value Date   WBC 6.7 08/30/2023   HGB 14.6 08/30/2023   HCT 43.6 08/30/2023   MCV 82.8 08/30/2023   PLT 395.0 08/30/2023   No results found for: "IRON", "TIBC", "FERRITIN"  Attestation Statements:   Reviewed by clinician on day of visit: allergies,  medications, problem list, medical history, surgical history, family history, social history, and previous encounter notes.  I have reviewed the above documentation for accuracy and completeness, and I agree with the above. -  Lis Savitt d. Kruz Chiu, NP-C

## 2023-11-22 ENCOUNTER — Other Ambulatory Visit (INDEPENDENT_AMBULATORY_CARE_PROVIDER_SITE_OTHER): Payer: Self-pay | Admitting: Adult Health

## 2023-11-23 ENCOUNTER — Ambulatory Visit (INDEPENDENT_AMBULATORY_CARE_PROVIDER_SITE_OTHER): Payer: Federal, State, Local not specified - PPO | Admitting: Adult Health

## 2023-11-23 ENCOUNTER — Encounter (INDEPENDENT_AMBULATORY_CARE_PROVIDER_SITE_OTHER): Payer: Self-pay | Admitting: Adult Health

## 2023-11-23 VITALS — BP 126/77 | HR 78 | Temp 98.4°F | Ht 70.0 in | Wt 359.0 lb

## 2023-11-23 DIAGNOSIS — I10 Essential (primary) hypertension: Secondary | ICD-10-CM | POA: Diagnosis not present

## 2023-11-23 DIAGNOSIS — E559 Vitamin D deficiency, unspecified: Secondary | ICD-10-CM | POA: Diagnosis not present

## 2023-11-23 DIAGNOSIS — Z6841 Body Mass Index (BMI) 40.0 and over, adult: Secondary | ICD-10-CM

## 2023-11-23 DIAGNOSIS — E782 Mixed hyperlipidemia: Secondary | ICD-10-CM

## 2023-11-23 DIAGNOSIS — E88819 Insulin resistance, unspecified: Secondary | ICD-10-CM | POA: Diagnosis not present

## 2023-11-23 DIAGNOSIS — E669 Obesity, unspecified: Secondary | ICD-10-CM

## 2023-11-23 DIAGNOSIS — E66813 Obesity, class 3: Secondary | ICD-10-CM

## 2023-11-23 MED ORDER — VITAMIN D (ERGOCALCIFEROL) 1.25 MG (50000 UNIT) PO CAPS: ORAL_CAPSULE | ORAL | 0 refills | Status: DC

## 2023-11-23 MED ORDER — WEGOVY 2.4 MG/0.75ML ~~LOC~~ SOAJ: 2.4000 mg | SUBCUTANEOUS | 0 refills | Status: DC

## 2023-11-23 NOTE — Progress Notes (Signed)
 WEIGHT SUMMARY AND BIOMETRICS  Vitals Temp: 98.4 F (36.9 C) BP: 126/77 Pulse Rate: 78 SpO2: 100 %   Anthropometric Measurements Height: 5' 10 (1.778 m) Weight: (!) 359 lb (162.8 kg) BMI (Calculated): 51.51 Weight at Last Visit: 362 lb Weight Lost Since Last Visit: 3 lb Weight Gained Since Last Visit: 0 Starting Weight: 392 lb Total Weight Loss (lbs): 34 lb (15.4 kg) Peak Weight: 405 lb   Body Composition  Body Fat %: 41.1 % Fat Mass (lbs): 147.6 lbs Muscle Mass (lbs): 201.4 lbs Total Body Water (lbs): 143.8 lbs Visceral Fat Rating : 28   Other Clinical Data Fasting: No Labs: Yes Today's Visit #: 51 Starting Date: 08/11/23    Chief Complaint:   OBESITY Shawn [name] is here to discuss his progress with his obesity treatment plan. He is on the the Category 4 Plan and states he is following his eating plan approximately 0 % of the time.  He states he is not currently exercising.   Interim History:  Since Oct 2021- he has been on GLP-1 therapy (either Ozempic  or Wegovy ). He is currently on Wegovy  2.4mg  once weekly injection Denies mass in neck, dysphagia, dyspepsia, persistent hoarseness, abdominal pain, or N/V/C   Starting weight 08/2020   09/15/20  Weight 395 lb (179.2 kg) (H)     11/23/23 12:00  Weight 359 lb (162.8 kg) (H)   Exercise-None currently- He plans on resuming Step Practice soon- will practice 2 hour sessions, 2-3 times per week  Of note- He and his family will travel to [place] for a week long vacation!  Subjective:   1. Essential hypertension BP at goal at OV He plans on resuming Step Practice soon- will practice 2 hour sessions, 2-3 times per week He is currently on minoxidil (LONITEN) 2.5 MG tablet  EPINEPHrine  (EPIPEN  2-PAK) 0.3 mg/0.3 mL IJ SOAJ injection  hydrochlorothiazide  (HYDRODIURIL ) 25 MG tablet  lisinopril  (ZESTRIL ) 40 MG tablet   2. Insulin  resistance He denies first degree family hx of diabetes. He  reports that several 2nd degree family members on his mother's side do have T2D He is on weekly Wegovy - Obesity and Heart Diease  3. Vitamin D  deficiency  Latest Reference Range & Units 07/21/22 09:33 11/24/22 11:07 05/03/23 15:38  Vitamin D , 25-Hydroxy 30.0 - 100.0 ng/mL 32.8 26.0 (L) 29.7 (L)  (L): Data is abnormally low  He is on bi-weekly Ergocalciferol   4. Mixed hyperlipidemia Lipid Panel     Component Value Date/Time   CHOL 216 (H) 08/30/2023 1625   CHOL 220 (H) 02/08/2022 1519   TRIG 116.0 08/30/2023 1625   HDL 39.40 08/30/2023 1625   HDL 32 (L) 02/08/2022 1519   CHOLHDL 5 08/30/2023 1625   VLDL 23.2 08/30/2023 1625   LDLCALC 154 (H) 08/30/2023 1625   LDLCALC 170 (H) 02/08/2022 1519   LDLDIRECT 141 (H) 12/11/2012 1624   LABVLDL 18 02/08/2022 1519    He denies tobacco/vape use He is not currently on any statin therapy  Assessment/Plan:   1. Essential hypertension (Primary) Continue minoxidil (LONITEN) 2.5 MG tablet  EPINEPHrine  (EPIPEN  2-PAK) 0.3 mg/0.3 mL IJ SOAJ injection  hydrochlorothiazide  (HYDRODIURIL ) 25 MG tablet  lisinopril  (ZESTRIL ) 40 MG tablet   2. Insulin  resistance Increase protein and limit sugar/simple CHO intake Continue weekly Wegovy  2.4 mg injections  3. Vitamin D  deficiency Refill Vitamin D , Ergocalciferol , (DRISDOL ) 1.25 MG (50000 UNIT) CAPS capsule TAKE ONE CAPSULE BY MOUTH ONCE A EVERY 3 DAYS Dispense: 12 capsule, Refills: 0  ordered   4. Mixed hyperlipidemia Check Labs at next   5. Obesity with current BMI of 51.5 Refill - Semaglutide -Weight Management (WEGOVY ) 2.4 MG/0.75ML SOAJ; Inject 2.4 mg into the skin once a week.  Dispense: 3 mL; Refill: 0  Shawn [name] is not currently in the action stage of change. As such, his goal is to get back to weightloss efforts . He has agreed to the Category 4 Plan.   Exercise goals: All adults should avoid inactivity. Some physical activity is better than none, and adults who participate in any amount  of physical activity gain some health benefits. Adults should also include muscle-strengthening activities that involve all major muscle groups on 2 or more days a week.  Behavioral modification strategies: increasing lean protein intake, decreasing simple carbohydrates, increasing vegetables, increasing water intake, meal planning and cooking strategies, keeping healthy foods in the home, ways to avoid boredom eating, and planning for success.  Shawn [name] has agreed to follow-up with our clinic in 4 weeks. He was informed of the importance of frequent follow-up visits to maximize his success with intensive lifestyle modifications for his multiple health conditions.   Check Fasting Labs at next OV  Objective:   Blood pressure 126/77, pulse 78, temperature 98.4 F (36.9 C), height 5' 10 (1.778 m), weight (!) 359 lb (162.8 kg), SpO2 100%. Body mass index is 51.51 kg/m.  General: Cooperative, alert, well developed, in no acute distress. HEENT: Conjunctivae and lids unremarkable. Cardiovascular: Regular rhythm.  Lungs: Normal work of breathing. Neurologic: No focal deficits.   Lab Results  Component Value Date   CREATININE 1.32 (H) 07/12/2023   BUN 14 07/12/2023   NA 139 07/12/2023   K 4.7 07/12/2023   CL 97 07/12/2023   CO2 27 07/12/2023   Lab Results  Component Value Date   ALT 13 07/12/2023   AST 18 07/12/2023   ALKPHOS 98 07/12/2023   BILITOT 0.3 07/12/2023   Lab Results  Component Value Date   HGBA1C 5.5 05/03/2023   HGBA1C 5.4 11/24/2022   HGBA1C 5.5 07/21/2022   HGBA1C 5.3 02/08/2022   HGBA1C 5.4 09/30/2021   Lab Results  Component Value Date   INSULIN  16.9 07/12/2023   INSULIN  23.0 11/24/2022   INSULIN  12.8 07/21/2022   INSULIN  25.4 (H) 02/08/2022   INSULIN  36.4 (H) 09/30/2021   Lab Results  Component Value Date   TSH 1.070 02/08/2022   Lab Results  Component Value Date   CHOL 216 (H) 08/30/2023   HDL 39.40 08/30/2023   LDLCALC 154 (H) 08/30/2023    LDLDIRECT 141 (H) 12/11/2012   TRIG 116.0 08/30/2023   CHOLHDL 5 08/30/2023   Lab Results  Component Value Date   VD25OH 29.7 (L) 05/03/2023   VD25OH 26.0 (L) 11/24/2022   VD25OH 32.8 07/21/2022   Lab Results  Component Value Date   WBC 6.7 08/30/2023   HGB 14.6 08/30/2023   HCT 43.6 08/30/2023   MCV 82.8 08/30/2023   PLT 395.0 08/30/2023   No results found for: IRON, TIBC, FERRITIN  Attestation Statements:   Reviewed by clinician on day of visit: allergies, medications, problem list, medical history, surgical history, family history, social history, and previous encounter notes.  I have reviewed the above documentation for accuracy and completeness, and I agree with the above. -  Fate Caster d. Mikal Wisman, NP-C

## 2023-12-04 ENCOUNTER — Encounter (INDEPENDENT_AMBULATORY_CARE_PROVIDER_SITE_OTHER): Payer: Self-pay | Admitting: Adult Health

## 2023-12-04 ENCOUNTER — Encounter (INDEPENDENT_AMBULATORY_CARE_PROVIDER_SITE_OTHER): Payer: Self-pay

## 2023-12-05 ENCOUNTER — Other Ambulatory Visit (INDEPENDENT_AMBULATORY_CARE_PROVIDER_SITE_OTHER): Payer: Self-pay | Admitting: Adult Health

## 2023-12-05 MED ORDER — WEGOVY 2.4 MG/0.75ML ~~LOC~~ SOAJ: 2.4000 mg | SUBCUTANEOUS | 0 refills | Status: DC

## 2023-12-21 ENCOUNTER — Ambulatory Visit (INDEPENDENT_AMBULATORY_CARE_PROVIDER_SITE_OTHER): Payer: Federal, State, Local not specified - PPO | Admitting: Adult Health

## 2023-12-21 ENCOUNTER — Encounter (INDEPENDENT_AMBULATORY_CARE_PROVIDER_SITE_OTHER): Payer: Self-pay | Admitting: Adult Health

## 2023-12-21 VITALS — BP 143/96 | HR 63 | Temp 98.0°F | Ht 70.0 in | Wt 363.0 lb

## 2023-12-21 DIAGNOSIS — Z6841 Body Mass Index (BMI) 40.0 and over, adult: Secondary | ICD-10-CM

## 2023-12-21 DIAGNOSIS — E88819 Insulin resistance, unspecified: Secondary | ICD-10-CM | POA: Diagnosis not present

## 2023-12-21 DIAGNOSIS — E669 Obesity, unspecified: Secondary | ICD-10-CM

## 2023-12-21 DIAGNOSIS — I10 Essential (primary) hypertension: Secondary | ICD-10-CM

## 2023-12-21 DIAGNOSIS — E559 Vitamin D deficiency, unspecified: Secondary | ICD-10-CM | POA: Diagnosis not present

## 2023-12-21 NOTE — Progress Notes (Addendum)
 WEIGHT SUMMARY AND BIOMETRICS  Vitals Temp: 98 F (36.7 C) BP: (!) 143/96 Pulse Rate: 63 SpO2: 99 %   Anthropometric Measurements Height: 5\' 10"  (1.778 m) Weight: (!) 363 lb (164.7 kg) BMI (Calculated): 52.09 Weight at Last Visit: 359lb Weight Lost Since Last Visit: 0 Weight Gained Since Last Visit: 4lb Starting Weight: 392lb Total Weight Loss (lbs): 30 lb (13.6 kg) Peak Weight: 405lb   Body Composition  Body Fat %: 42.3 % Fat Mass (lbs): 153.6 lbs Muscle Mass (lbs): 199.2 lbs Total Body Water (lbs): 146.6 lbs Visceral Fat Rating : 29   Other Clinical Data Fasting: yes Labs: yes Today's Visit #: 68 Starting Date: 08/11/23    Chief Complaint:   OBESITY Shawn [name] is here to discuss his progress with his obesity treatment plan. He is on the the Category 4 Plan and states he is following his eating plan approximately 25 % of the time.  He states he is exercising Cardio 90 minutes 3 times per week.   Interim History:  Since Oct 2021- he has been on GLP-1 therapy (either Ozempic or [place]). Last dose of Wegovy 2.4mg  once weekly injection early Jan 2025. 2025 came with change in tier for medication coverage. His PA was approved for [facility], however with the change in tier- cost after insurance applied is >$775/month  Hunger/appetite-he endorses increased appetite since stopping GLP-1 therapy. He reports that he can eat much larger portion sizes in the last weeks.  Stress- he endorses significant increase in stress levels r/t this his job.  He works in [employer]- unsure how funding will be impacted with recent Executive orders from [employer] ia participating in "Step Practice"- sessions are 90 mins 3 times weekly  Of note- Weight when he started GLP-1 therapy   09/15/20  Height 5\' 10"  (1.778 m)  Weight 395 lb (179.2 kg) (H)    Weight at last dose of max dose [facility]  11/23/23 12:00  Height 5\' 10"  (1.778 m)  Weight 359 lb (162.8 kg) (H)    TWL 36 lbs with >4 years of GLP-1  Subjective:   1. Vitamin D deficiency He is on twice weekly Ergocalciferol- denies N/V/Muscle Weakness  2. Insulin resistance Since Oct 2021- he has been on GLP-1 therapy (either Ozempic or [place]). Last dose of Wegovy 2.4mg  once weekly injection early Jan 2025. 2025 came with change in tier for medication coverage. His PA was approved for [facility], however with the change in tier- cost after insurance applied is >$775/month  Hunger/appetite-he endorses increased appetite since stopping GLP-1 therapy. He reports that he can eat much larger portion sizes in the last weeks.  Discussed risks/benefits of Metformin therapy  3. Essential hypertension BP continues to be uncontrolled. He reports taking daily Zestril 40mg  and hydrochlorothiazide 25mg  this morning. He does endorse increased levels of stress from work. He denies CP with exertion. PCP manages antihypertensive therapy.  Assessment/Plan:   1. Vitamin D deficiency (Primary) Check Labs - VITAMIN D 25 Hydroxy (Vit-D Deficiency, Fractures)  2. Insulin resistance Check Labs - Hemoglobin A1c - Insulin, random Metformin information sheet provided to pt  3. Essential hypertension Check Labs - Comprehensive metabolic panel  4. Obesity with current BMI of 52.09  Shawn [name] is not currently in the action stage of change. As such, his goal is to get back to weightloss efforts . He has agreed to the Category 4 Plan.   Exercise goals: For substantial health benefits, adults should do at least  150 minutes (2 hours and 30 minutes) a week of moderate-intensity, or 75 minutes (1 hour and 15 minutes) a week of vigorous-intensity aerobic physical activity, or an equivalent combination of moderate- and vigorous-intensity aerobic activity. Aerobic activity should be performed in episodes of at least 10 minutes, and preferably, it should be spread throughout the week.  Behavioral modification strategies:  increasing lean protein intake, decreasing simple carbohydrates, increasing vegetables, increasing water intake, meal planning and cooking strategies, keeping healthy foods in the home, ways to avoid boredom eating, ways to avoid night time snacking, better snacking choices, emotional eating strategies, travel eating strategies, planning for success, and decreasing junk food.  Shawn [name] has agreed to follow-up with our clinic in 4 weeks. He was informed of the importance of frequent follow-up visits to maximize his success with intensive lifestyle modifications for his multiple health conditions.   Shawn [name] was informed we would discuss his lab results at his next visit unless there is a critical issue that needs to be addressed sooner. Shawn [name] agreed to keep his next visit at the agreed upon time to discuss these results.  Objective:   Blood pressure (!) 143/96, pulse 63, temperature 98 F (36.7 C), height 5\' 10"  (1.778 m), weight (!) 363 lb (164.7 kg), SpO2 99%. Body mass index is 52.09 kg/m.  General: Cooperative, alert, well developed, in no acute distress. HEENT: Conjunctivae and lids unremarkable. Cardiovascular: Regular rhythm.  Lungs: Normal work of breathing. Neurologic: No focal deficits.   Lab Results  Component Value Date   CREATININE 1.32 (H) 07/12/2023   BUN 14 07/12/2023   NA 139 07/12/2023   K 4.7 07/12/2023   CL 97 07/12/2023   CO2 27 07/12/2023   Lab Results  Component Value Date   ALT 13 07/12/2023   AST 18 07/12/2023   ALKPHOS 98 07/12/2023   BILITOT 0.3 07/12/2023   Lab Results  Component Value Date   HGBA1C 5.5 05/03/2023   HGBA1C 5.4 11/24/2022   HGBA1C 5.5 07/21/2022   HGBA1C 5.3 02/08/2022   HGBA1C 5.4 09/30/2021   Lab Results  Component Value Date   INSULIN 16.9 07/12/2023   INSULIN 23.0 11/24/2022   INSULIN 12.8 07/21/2022   INSULIN 25.4 (H) 02/08/2022   INSULIN 36.4 (H) 09/30/2021   Lab Results  Component Value Date   TSH 1.070  02/08/2022   Lab Results  Component Value Date   CHOL 216 (H) 08/30/2023   HDL 39.40 08/30/2023   LDLCALC 154 (H) 08/30/2023   LDLDIRECT 141 (H) 12/11/2012   TRIG 116.0 08/30/2023   CHOLHDL 5 08/30/2023   Lab Results  Component Value Date   VD25OH 29.7 (L) 05/03/2023   VD25OH 26.0 (L) 11/24/2022   VD25OH 32.8 07/21/2022   Lab Results  Component Value Date   WBC 6.7 08/30/2023   HGB 14.6 08/30/2023   HCT 43.6 08/30/2023   MCV 82.8 08/30/2023   PLT 395.0 08/30/2023   No results found for: "IRON", "TIBC", "FERRITIN"  Attestation Statements:   Reviewed by clinician on day of visit: allergies, medications, problem list, medical history, surgical history, family history, social history, and previous encounter notes.  I have reviewed the above documentation for accuracy and completeness, and I agree with the above. -  Milka Windholz d. Ezinne Yogi, NP-C

## 2023-12-23 LAB — COMPREHENSIVE METABOLIC PANEL
ALT: 19 [IU]/L (ref 0–44)
AST: 20 [IU]/L (ref 0–40)
Albumin: 4.1 g/dL (ref 4.1–5.1)
Alkaline Phosphatase: 97 [IU]/L (ref 44–121)
BUN/Creatinine Ratio: 8 — ABNORMAL LOW (ref 9–20)
BUN: 10 mg/dL (ref 6–20)
Bilirubin Total: 0.3 mg/dL (ref 0.0–1.2)
CO2: 26 mmol/L (ref 20–29)
Calcium: 9.3 mg/dL (ref 8.7–10.2)
Chloride: 102 mmol/L (ref 96–106)
Creatinine, Ser: 1.3 mg/dL — ABNORMAL HIGH (ref 0.76–1.27)
Globulin, Total: 2.7 g/dL (ref 1.5–4.5)
Glucose: 85 mg/dL (ref 70–99)
Potassium: 4.9 mmol/L (ref 3.5–5.2)
Sodium: 142 mmol/L (ref 134–144)
Total Protein: 6.8 g/dL (ref 6.0–8.5)
eGFR: 73 mL/min/{1.73_m2} (ref >59)

## 2023-12-23 LAB — VITAMIN D 25 HYDROXY (VIT D DEFICIENCY, FRACTURES): Vit D, 25-Hydroxy: 58.3 ng/mL (ref 30.0–100.0)

## 2023-12-23 LAB — HEMOGLOBIN A1C
Est. average glucose Bld gHb Est-mCnc: 111 mg/dL
Hgb A1c MFr Bld: 5.5 % (ref 4.8–5.6)

## 2023-12-23 LAB — INSULIN, RANDOM: INSULIN: 22.7 u[IU]/mL (ref 2.6–24.9)

## 2023-12-25 ENCOUNTER — Other Ambulatory Visit (INDEPENDENT_AMBULATORY_CARE_PROVIDER_SITE_OTHER): Payer: Self-pay | Admitting: Adult Health

## 2023-12-25 DIAGNOSIS — E559 Vitamin D deficiency, unspecified: Secondary | ICD-10-CM

## 2023-12-26 ENCOUNTER — Encounter (INDEPENDENT_AMBULATORY_CARE_PROVIDER_SITE_OTHER): Payer: Self-pay | Admitting: Adult Health

## 2024-01-18 ENCOUNTER — Ambulatory Visit (INDEPENDENT_AMBULATORY_CARE_PROVIDER_SITE_OTHER): Payer: Federal, State, Local not specified - PPO | Admitting: Adult Health

## 2024-01-30 ENCOUNTER — Ambulatory Visit (INDEPENDENT_AMBULATORY_CARE_PROVIDER_SITE_OTHER): Payer: Federal, State, Local not specified - PPO | Admitting: Adult Health

## 2024-01-30 ENCOUNTER — Encounter (INDEPENDENT_AMBULATORY_CARE_PROVIDER_SITE_OTHER): Payer: Self-pay | Admitting: Adult Health

## 2024-01-30 VITALS — BP 109/72 | HR 88 | Temp 98.1°F | Ht 70.0 in | Wt 374.0 lb

## 2024-01-30 DIAGNOSIS — E559 Vitamin D deficiency, unspecified: Secondary | ICD-10-CM

## 2024-01-30 DIAGNOSIS — K219 Gastro-esophageal reflux disease without esophagitis: Secondary | ICD-10-CM | POA: Diagnosis not present

## 2024-01-30 DIAGNOSIS — I10 Essential (primary) hypertension: Secondary | ICD-10-CM | POA: Diagnosis not present

## 2024-01-30 DIAGNOSIS — E88819 Insulin resistance, unspecified: Secondary | ICD-10-CM | POA: Diagnosis not present

## 2024-01-30 DIAGNOSIS — Z6841 Body Mass Index (BMI) 40.0 and over, adult: Secondary | ICD-10-CM

## 2024-01-30 DIAGNOSIS — E669 Obesity, unspecified: Secondary | ICD-10-CM

## 2024-01-30 MED ORDER — VITAMIN D (ERGOCALCIFEROL) 1.25 MG (50000 UNIT) PO CAPS: ORAL_CAPSULE | ORAL | 0 refills | Status: DC

## 2024-01-30 MED ORDER — METFORMIN HCL 500 MG PO TABS: ORAL_TABLET | ORAL | 0 refills | Status: DC

## 2024-01-30 NOTE — Progress Notes (Signed)
 WEIGHT SUMMARY AND BIOMETRICS  No data recorded Anthropometric Measurements Height: 5\' 10"  (1.778 m) Weight at Last Visit: 363 lb Starting Weight: 392 lb Peak Weight: 405 lb   No data recorded Other Clinical Data Fasting: No Labs: No Today's Visit #: 40 Starting Date: 08/11/23    Chief Complaint:   OBESITY Shawn [name] is here to discuss his progress with his obesity treatment plan.  He is on the the Category 4 Plan and states he is following his eating plan approximately 5 % of the time.  He states he is exercising Step Practice 90 minutes 3-4 times per week.   Interim History:  Since Oct 2021- he has been on GLP-1 therapy (either Ozempic or [place]). Last dose of Wegovy 2.4mg  once weekly injection Dec 2024 2025 came with change in tier for medication coverage. His PA was approved for [facility], however with the change in tier- cost after insurance applied is >$775/month  Reviewed Bioimpedance results with pt: Muscle Mass: + 6.2 lbs Adipose Mass: + 4.6 lbs  Hunger/appetite-increase hunger since stopping weekly GLP-1 injection  Exercise-Step Practice- 90 mins 3-4 times per week  Hydration-He will have 2 cups of coffee in am , 40 oz plain water throughout the day, afternoon Pepsi, lunch often has Shawn [name].  Subjective:   1. Essential hypertension Discussed Labs 12/21/2023 CMP: Electrolytes and liver enzymes- stable Serum Creat elevated at 1.30 GFR stable at 73  2. Vitamin D deficiency Discussed Labs  Latest Reference Range & Units 12/21/23 11:27  Vitamin D, 25-Hydroxy 30.0 - 100.0 ng/mL 58.3   Vit D Level at goal He is on bi-weekly Ergocalciferol- denies N/V/Muscle Weakness  3. Gastroesophageal reflux disease without esophagitis  famotidine (PEPCID) 20 MG tablet  omeprazole (PRILOSEC) 40 MG capsule   He reports GERD sx's are currently well controlled  4. Insulin resistance Discussed Labs  Latest Reference Range & Units 12/21/23 11:27  Glucose 70  - 99 mg/dL 85  Hemoglobin W2N 4.8 - 5.6 % 5.5  Est. average glucose Bld gHb Est-mCnc mg/dL 562  INSULIN 2.6 - 13.0 uIU/mL 22.7    CBG and A1c at goal. Insulin worsened and above goal.  Since Oct 2021- he has been on GLP-1 therapy (either Ozempic or [place]). Last dose of Wegovy 2.4mg  once weekly injection early Jan 2025 2025 came with change in tier for medication coverage. His PA was approved for [facility], however with the change in tier- cost after insurance applied is >$775/month   Hunger/appetite-he endorses increased appetite since stopping GLP-1 therapy. He reports that he can eat much larger portion sizes in the last weeks.   Discussed risks/benefits of Metformin therapy at last [facility] OV on 12/21/2023- information sheet given to pt at last ON  Assessment/Plan:   1. Essential hypertension Limit Na+ Limit calorie laden drinks Increase plain water intake to at least 80 oz/day  2. Vitamin D deficiency Refill and DECREASE Vitamin D, Ergocalciferol, (DRISDOL) 1.25 MG (50000 UNIT) CAPS capsule TAKE ONE CAPSULE BY MOUTH ONCE A EVERY 7 DAYS Dispense: 4 capsule, Refills: 0 ordered   3. Gastroesophageal reflux disease without esophagitis Do not overeat or too close to bedtime. Avoid known trigger foods  4. Insulin resistance Start metFORMIN (GLUCOPHAGE) 500 MG tablet 1/2 tab once daily with full meal Dispense: 30 tablet, Refills: 0 ordered   Decrease simple CHO/sugar Increase lean protein  Recheck BMP in 6-8 weeks  6. Obesity with current BMI of 52.09 (Primary)  Shawn [name] is currently in the action stage  of change. As such, his goal is to continue with weight loss efforts. He has agreed to the Category 4 Plan.   Exercise goals: All adults should avoid inactivity. Some physical activity is better than none, and adults who participate in any amount of physical activity gain some health benefits. Adults should also include muscle-strengthening activities that involve all major muscle  groups on 2 or more days a week.  Behavioral modification strategies: increasing lean protein intake, decreasing simple carbohydrates, increasing vegetables, increasing water intake, decreasing eating out, no skipping meals, meal planning and cooking strategies, keeping healthy foods in the home, ways to avoid boredom eating, and planning for success.  [name] has agreed to follow-up with our clinic in 4 weeks. He was informed of the importance of frequent follow-up visits to maximize his success with intensive lifestyle modifications for his multiple health conditions.   Objective:   Height 5\' 10"  (1.778 m). Body mass index is 52.09 kg/m.  General: Cooperative, alert, well developed, in no acute distress. HEENT: Conjunctivae and lids unremarkable. Cardiovascular: Regular rhythm.  Lungs: Normal work of breathing. Neurologic: No focal deficits.   Lab Results  Component Value Date   CREATININE 1.30 (H) 12/21/2023   BUN 10 12/21/2023   NA 142 12/21/2023   K 4.9 12/21/2023   CL 102 12/21/2023   CO2 26 12/21/2023   Lab Results  Component Value Date   ALT 19 12/21/2023   AST 20 12/21/2023   ALKPHOS 97 12/21/2023   BILITOT 0.3 12/21/2023   Lab Results  Component Value Date   HGBA1C 5.5 12/21/2023   HGBA1C 5.5 05/03/2023   HGBA1C 5.4 11/24/2022   HGBA1C 5.5 07/21/2022   HGBA1C 5.3 02/08/2022   Lab Results  Component Value Date   INSULIN 22.7 12/21/2023   INSULIN 16.9 07/12/2023   INSULIN 23.0 11/24/2022   INSULIN 12.8 07/21/2022   INSULIN 25.4 (H) 02/08/2022   Lab Results  Component Value Date   TSH 1.070 02/08/2022   Lab Results  Component Value Date   CHOL 216 (H) 08/30/2023   HDL 39.40 08/30/2023   LDLCALC 154 (H) 08/30/2023   LDLDIRECT 141 (H) 12/11/2012   TRIG 116.0 08/30/2023   CHOLHDL 5 08/30/2023   Lab Results  Component Value Date   VD25OH 58.3 12/21/2023   VD25OH 29.7 (L) 05/03/2023   VD25OH 26.0 (L) 11/24/2022   Lab Results  Component Value  Date   WBC 6.7 08/30/2023   HGB 14.6 08/30/2023   HCT 43.6 08/30/2023   MCV 82.8 08/30/2023   PLT 395.0 08/30/2023   No results found for: "IRON", "TIBC", "FERRITIN"  Attestation Statements:   Reviewed by clinician on day of visit: allergies, medications, problem list, medical history, surgical history, family history, social history, and previous encounter notes.  I have reviewed the above documentation for accuracy and completeness, and I agree with the above. -  Danyka Merlin d. Taraya Steward, NP-C

## 2024-02-07 ENCOUNTER — Encounter (INDEPENDENT_AMBULATORY_CARE_PROVIDER_SITE_OTHER): Payer: Self-pay | Admitting: Adult Health

## 2024-02-08 DIAGNOSIS — L219 Seborrheic dermatitis, unspecified: Secondary | ICD-10-CM | POA: Diagnosis not present

## 2024-02-08 DIAGNOSIS — L649 Androgenic alopecia, unspecified: Secondary | ICD-10-CM | POA: Diagnosis not present

## 2024-02-08 DIAGNOSIS — L83 Acanthosis nigricans: Secondary | ICD-10-CM | POA: Diagnosis not present

## 2024-02-15 ENCOUNTER — Other Ambulatory Visit (INDEPENDENT_AMBULATORY_CARE_PROVIDER_SITE_OTHER): Payer: Self-pay | Admitting: Adult Health

## 2024-02-15 DIAGNOSIS — E559 Vitamin D deficiency, unspecified: Secondary | ICD-10-CM

## 2024-03-01 ENCOUNTER — Other Ambulatory Visit (INDEPENDENT_AMBULATORY_CARE_PROVIDER_SITE_OTHER): Payer: Self-pay | Admitting: Adult Health

## 2024-03-21 ENCOUNTER — Ambulatory Visit (INDEPENDENT_AMBULATORY_CARE_PROVIDER_SITE_OTHER): Admitting: Adult Health

## 2024-03-21 VITALS — BP 114/73 | HR 82 | Temp 98.3°F | Ht 70.0 in | Wt 375.0 lb

## 2024-03-21 DIAGNOSIS — Z6841 Body Mass Index (BMI) 40.0 and over, adult: Secondary | ICD-10-CM

## 2024-03-21 DIAGNOSIS — R7989 Other specified abnormal findings of blood chemistry: Secondary | ICD-10-CM | POA: Diagnosis not present

## 2024-03-21 DIAGNOSIS — E669 Obesity, unspecified: Secondary | ICD-10-CM

## 2024-03-21 DIAGNOSIS — I10 Essential (primary) hypertension: Secondary | ICD-10-CM

## 2024-03-21 DIAGNOSIS — E88819 Insulin resistance, unspecified: Secondary | ICD-10-CM

## 2024-03-21 DIAGNOSIS — E559 Vitamin D deficiency, unspecified: Secondary | ICD-10-CM | POA: Diagnosis not present

## 2024-03-21 MED ORDER — VITAMIN D (ERGOCALCIFEROL) 1.25 MG (50000 UNIT) PO CAPS: ORAL_CAPSULE | ORAL | 0 refills | Status: DC

## 2024-03-21 MED ORDER — METFORMIN HCL ER 500 MG PO TB24: ORAL_TABLET | ORAL | 0 refills | Status: DC

## 2024-03-21 NOTE — Progress Notes (Signed)
 WEIGHT SUMMARY AND BIOMETRICS  Vitals Temp: 98.3 F (36.8 C) BP: 114/73 Pulse Rate: 82 SpO2: 96 %   Anthropometric Measurements Height: 5\' 10"  (1.778 m) Weight: (!) 375 lb (170.1 kg) BMI (Calculated): 53.81 Weight at Last Visit: 374 lb Weight Lost Since Last Visit: 0 Weight Gained Since Last Visit: 1 lb Starting Weight: 392 lb Total Weight Loss (lbs): 18 lb (8.165 kg) Peak Weight: 405 l   Body Composition  Body Fat %: 42.6 % Fat Mass (lbs): 160 lbs Muscle Mass (lbs): 205 lbs Total Body Water (lbs): 153.4 lbs Visceral Fat Rating : 30   Other Clinical Data Fasting: no Labs: no Today's Visit #: 41 Starting Date: 08/11/23    Chief Complaint:   OBESITY Shawn [name] is here to discuss his progress with his obesity treatment plan.  He is on the the Category 4 Plan and states he is following his eating plan approximately 14 % of the time.  He states he is exercising: None   Interim History:  We discussed at length the listed alternative weight loss medications provided by his insurance carrier. Recommend avoiding them all, as his hx of uncontrolled BP  Since Oct 2021- he has been on GLP-1 therapy (either Ozempic  or Wegovy ). Last dose of Wegovy  2.4mg  once weekly injection Dec 2024 2025 came with change in tier for medication coverage. His PA was approved for Wegovy , however with the change in tier- cost after insurance applied is >$775/month  Subjective:   1. Elevated serum creatinine Discussed Labs  Latest Reference Range & Units 12/21/23 11:27  eGFR >59 mL/min/1.73 73   BP much improved today! He denies CP with exertion PCP manages hydrochlorothiazide  (HYDRODIURIL ) 25 MG tablet  lisinopril  (ZESTRIL ) 40 MG tablet   2. Vitamin D  deficiency  Latest Reference Range & Units 12/21/23 11:27  Vitamin D , 25-Hydroxy 30.0 - 100.0 ng/mL 58.3   He is currently on weekly Ergocalciferol - denies N/V/Muscle Weakness  3. Essential hypertension BP much improved  today! He has hx of chronically uncontrolled HTN- often correlates to missing antihypertensives for several days He denies CP with exertion PCP manages hydrochlorothiazide  (HYDRODIURIL ) 25 MG tablet  lisinopril  (ZESTRIL ) 40 MG tablet    4. Insulin  resistance  Latest Reference Range & Units 07/21/22 09:33 11/24/22 11:07 07/12/23 10:09 12/21/23 11:27  INSULIN  2.6 - 24.9 uIU/mL 12.8 23.0 16.9 22.7    Latest Reference Range & Units 07/21/22 09:33 11/24/22 11:07 07/12/23 10:09 12/21/23 11:27  eGFR >59 mL/min/1.73 87 87 73 73   01/30/2024 Started on Metformin  500mg  1/2 tab daily with meal. He endorses loose stools  Assessment/Plan:   1. Elevated serum creatinine Check Labs - Comprehensive metabolic panel with GFR  2. Vitamin D  deficiency Refill - Vitamin D , Ergocalciferol , (DRISDOL ) 1.25 MG (50000 UNIT) CAPS capsule; TAKE ONE CAPSULE BY MOUTH ONCE A EVERY 7 DAYS  Dispense: 4 capsule; Refill: 0  3. Essential hypertension (Primary) Limit Na+ Resume healthy eating and increase daily walking Continue BP much improved today! He denies CP with exertion PCP manages hydrochlorothiazide  (HYDRODIURIL ) 25 MG tablet  lisinopril  (ZESTRIL ) 40 MG tablet   4. Insulin  resistance Check CMP Refill and INCREASE Change from plain to XR formulation due to mild GI upset metFORMIN  (GLUCOPHAGE -XR) 500 MG 24 hr tablet 1 tab with largest meal Dispense: 30 tablet, Refills: 0 ordered   5. Obesity with current BMI of 53.9  Shawn [name] is not currently in the action stage of change. As such, his goal is to continue with weight  loss efforts. He has agreed to the Category 4 Plan.   Exercise goals: All adults should avoid inactivity. Some physical activity is better than none, and adults who participate in any amount of physical activity gain some health benefits. Adults should also include muscle-strengthening activities that involve all major muscle groups on 2 or more days a week.  Behavioral modification  strategies: increasing lean protein intake, decreasing simple carbohydrates, increasing vegetables, increasing water intake, increasing high fiber foods, decreasing eating out, no skipping meals, meal planning and cooking strategies, keeping healthy foods in the home, ways to avoid boredom eating, ways to avoid night time snacking, avoiding temptations, planning for success, and decreasing junk food.  Shawn [name] has agreed to follow-up with our clinic in 4 weeks. He was informed of the importance of frequent follow-up visits to maximize his success with intensive lifestyle modifications for his multiple health conditions.   Shawn [name] was informed we would discuss his lab results at his next visit unless there is a critical issue that needs to be addressed sooner. Shawn [name] agreed to keep his next visit at the agreed upon time to discuss these results.  Objective:   Blood pressure 114/73, pulse 82, temperature 98.3 F (36.8 C), height 5\' 10"  (1.778 m), weight (!) 375 lb (170.1 kg), SpO2 96%. Body mass index is 53.81 kg/m.  General: Cooperative, alert, well developed, in no acute distress. HEENT: Conjunctivae and lids unremarkable. Cardiovascular: Regular rhythm.  Lungs: Normal work of breathing. Neurologic: No focal deficits.   Lab Results  Component Value Date   CREATININE 1.30 (H) 12/21/2023   BUN 10 12/21/2023   NA 142 12/21/2023   K 4.9 12/21/2023   CL 102 12/21/2023   CO2 26 12/21/2023   Lab Results  Component Value Date   ALT 19 12/21/2023   AST 20 12/21/2023   ALKPHOS 97 12/21/2023   BILITOT 0.3 12/21/2023   Lab Results  Component Value Date   HGBA1C 5.5 12/21/2023   HGBA1C 5.5 05/03/2023   HGBA1C 5.4 11/24/2022   HGBA1C 5.5 07/21/2022   HGBA1C 5.3 02/08/2022   Lab Results  Component Value Date   INSULIN  22.7 12/21/2023   INSULIN  16.9 07/12/2023   INSULIN  23.0 11/24/2022   INSULIN  12.8 07/21/2022   INSULIN  25.4 (H) 02/08/2022   Lab Results  Component Value Date    TSH 1.070 02/08/2022   Lab Results  Component Value Date   CHOL 216 (H) 08/30/2023   HDL 39.40 08/30/2023   LDLCALC 154 (H) 08/30/2023   LDLDIRECT 141 (H) 12/11/2012   TRIG 116.0 08/30/2023   CHOLHDL 5 08/30/2023   Lab Results  Component Value Date   VD25OH 58.3 12/21/2023   VD25OH 29.7 (L) 05/03/2023   VD25OH 26.0 (L) 11/24/2022   Lab Results  Component Value Date   WBC 6.7 08/30/2023   HGB 14.6 08/30/2023   HCT 43.6 08/30/2023   MCV 82.8 08/30/2023   PLT 395.0 08/30/2023   No results found for: "IRON", "TIBC", "FERRITIN"  Attestation Statements:   Reviewed by clinician on day of visit: allergies, medications, problem list, medical history, surgical history, family history, social history, and previous encounter notes.  I have reviewed the above documentation for accuracy and completeness, and I agree with the above. -  Deiontae Rabel d. Derrick Orris, NP-C

## 2024-03-22 LAB — VITAMIN D 25 HYDROXY (VIT D DEFICIENCY, FRACTURES): Vit D, 25-Hydroxy: 37 ng/mL (ref 30.0–100.0)

## 2024-04-11 DIAGNOSIS — M25652 Stiffness of left hip, not elsewhere classified: Secondary | ICD-10-CM | POA: Diagnosis not present

## 2024-04-11 DIAGNOSIS — M9901 Segmental and somatic dysfunction of cervical region: Secondary | ICD-10-CM | POA: Diagnosis not present

## 2024-04-11 DIAGNOSIS — M9904 Segmental and somatic dysfunction of sacral region: Secondary | ICD-10-CM | POA: Diagnosis not present

## 2024-04-11 DIAGNOSIS — M9903 Segmental and somatic dysfunction of lumbar region: Secondary | ICD-10-CM | POA: Diagnosis not present

## 2024-04-11 DIAGNOSIS — M7918 Myalgia, other site: Secondary | ICD-10-CM | POA: Diagnosis not present

## 2024-04-11 DIAGNOSIS — M9905 Segmental and somatic dysfunction of pelvic region: Secondary | ICD-10-CM | POA: Diagnosis not present

## 2024-04-11 DIAGNOSIS — M25651 Stiffness of right hip, not elsewhere classified: Secondary | ICD-10-CM | POA: Diagnosis not present

## 2024-05-02 ENCOUNTER — Encounter (INDEPENDENT_AMBULATORY_CARE_PROVIDER_SITE_OTHER): Payer: Self-pay | Admitting: Adult Health

## 2024-05-02 ENCOUNTER — Ambulatory Visit (INDEPENDENT_AMBULATORY_CARE_PROVIDER_SITE_OTHER): Admitting: Adult Health

## 2024-05-02 VITALS — BP 127/78 | HR 89 | Temp 97.9°F | Ht 70.0 in | Wt 371.0 lb

## 2024-05-02 DIAGNOSIS — E559 Vitamin D deficiency, unspecified: Secondary | ICD-10-CM

## 2024-05-02 DIAGNOSIS — E782 Mixed hyperlipidemia: Secondary | ICD-10-CM

## 2024-05-02 DIAGNOSIS — E88819 Insulin resistance, unspecified: Secondary | ICD-10-CM

## 2024-05-02 DIAGNOSIS — E669 Obesity, unspecified: Secondary | ICD-10-CM

## 2024-05-02 DIAGNOSIS — I10 Essential (primary) hypertension: Secondary | ICD-10-CM | POA: Diagnosis not present

## 2024-05-02 DIAGNOSIS — Z6841 Body Mass Index (BMI) 40.0 and over, adult: Secondary | ICD-10-CM

## 2024-05-02 MED ORDER — VITAMIN D (ERGOCALCIFEROL) 1.25 MG (50000 UNIT) PO CAPS: ORAL_CAPSULE | ORAL | 0 refills | Status: DC

## 2024-05-02 MED ORDER — WEGOVY 2.4 MG/0.75ML ~~LOC~~ SOAJ: 2.4000 mg | SUBCUTANEOUS | 0 refills | Status: DC

## 2024-05-02 NOTE — Progress Notes (Signed)
 WEIGHT SUMMARY AND BIOMETRICS  Vitals Temp: 97.9 F (36.6 C) BP: 127/78 Pulse Rate: 89 SpO2: 97 %   Anthropometric Measurements Height: 5' 10 (1.778 m) Weight: (!) 371 lb (168.3 kg) BMI (Calculated): 53.23 Weight at Last Visit: 375 LB Weight Lost Since Last Visit: 4 lb Weight Gained Since Last Visit: 0 Starting Weight: 392 LB Total Weight Loss (lbs): 22 lb (9.979 kg) Peak Weight: 405 LB   Body Composition  Body Fat %: 41.2 % Fat Mass (lbs): 153 lbs Muscle Mass (lbs): 207.8 lbs Total Body Water (lbs): 147.2 lbs Visceral Fat Rating : 29   Other Clinical Data Fasting: NO Labs: NO Today's Visit #: 42 Starting Date: 08/11/23    Chief Complaint:   OBESITY Shawn [name] is here to discuss his progress with his obesity treatment plan.  He is on the the Category 4 Plan and states he is following his eating plan approximately 30 % of the time.  He states he is exercising Yard Work and Walking 30-120 minutes 3 times per week.  Interim History:  Since Oct 2021- he has been on GLP-1 therapy (either Ozempic  or Wegovy ). Last dose of Wegovy  2.4mg  once weekly injection Dec 2024  He found a Wegovy  Coupon savings card and restarted Wegovy  2.4 mg on/about April 21, 2024 Denies mass in neck, dysphagia, dyspepsia, persistent hoarseness, abdominal pain, or N/V/C  He has two more injections  He is currently on daily Metformin  XR 500mg   Reviewed Bioimpedance results with pt: Muscle Mass: +2.8 lbs Adipose Mass: -7 lbs  Subjective:   1. Insulin  resistance 01/30/2024: Started on Metformin  500mg  1/2 tab daily 03/21/2024: Plain Metformin  replaced with XR formulation and increased from 250mg  to 500mg  daily He found a Wegovy  Coupon savings card and restarted Wegovy  2.4 mg on/about April 21, 2024 Denies mass in neck, dysphagia, dyspepsia, persistent hoarseness, abdominal pain, or N/V/C  He has two more injections  Latest Reference Range & Units 11/24/22 11:07 07/12/23 10:09 12/21/23  11:27  INSULIN  2.6 - 24.9 uIU/mL 23.0 16.9 22.7   2. Essential hypertension BP excellent and at goal at OV He is currently on: hydrochlorothiazide  (HYDRODIURIL ) 25 MG tablet  lisinopril  (ZESTRIL ) 40 MG tablet  minoxidil (LONITEN) 2.5 MG tablet   3. Vit D Def  Latest Reference Range & Units 11/24/22 11:07 05/03/23 15:38 12/21/23 11:27 03/21/24 15:10  Vitamin D , 25-Hydroxy 30.0 - 100.0 ng/mL 26.0 (L) 29.7 (L) 58.3 37.0  (L): Data is abnormally low  He endorses stable energy levels He is on weekly Ergocalciferol - denies N/V/Muscle Weakness  Assessment/Plan:   1. Insulin  resistance (Primary) Refill metFORMIN  (GLUCOPHAGE -XR) 500 MG 24 hr tablet 1 tab with largest meal Dispense: 30 tablet, Refills: 0 ordered  Refill WEGOVY  2.4 MG/0.75ML SOAJ Inject 2.4 mg into the skin once a week. Dispense: 3 mL, Refills: 0 ordered   2. Essential hypertension Continue hydrochlorothiazide  (HYDRODIURIL ) 25 MG tablet  lisinopril  (ZESTRIL ) 40 MG tablet  minoxidil (LONITEN) 2.5 MG tablet   INCREASE regular exercise  3. Vit D Def Refill  Vitamin D , Ergocalciferol , (DRISDOL ) 1.25 MG (50000 UNIT) CAPS capsule TAKE ONE CAPSULE BY MOUTH ONCE A EVERY 7 DAYS Dispense: 4 capsule, Refills: 0 ordered   4. Obesity with current BMI of 53.3 Refill WEGOVY  2.4 MG/0.75ML SOAJ Inject 2.4 mg into the skin once a week. Dispense: 3 mL, Refills: 0 ordered   Hommer is not currently in the action stage of change. As such, his goal is to get back to weightloss efforts .  He has agreed to the Category 4 Plan.   Exercise goals: All adults should avoid inactivity. Some physical activity is better than none, and adults who participate in any amount of physical activity gain some health benefits. Adults should also include muscle-strengthening activities that involve all major muscle groups on 2 or more days a week.  Behavioral modification strategies: increasing lean protein intake, decreasing simple carbohydrates,  increasing vegetables, increasing water intake, no skipping meals, meal planning and cooking strategies, keeping healthy foods in the home, ways to avoid boredom eating, and planning for success.  Kaston has agreed to follow-up with our clinic in 4 weeks. He was informed of the importance of frequent follow-up visits to maximize his success with intensive lifestyle modifications for his multiple health conditions.   Check Fasting Labs at next OV  Objective:   Blood pressure 127/78, pulse 89, temperature 97.9 F (36.6 C), height 5' 10 (1.778 m), weight (!) 371 lb (168.3 kg), SpO2 97%. Body mass index is 53.23 kg/m.  General: Cooperative, alert, well developed, in no acute distress. HEENT: Conjunctivae and lids unremarkable. Cardiovascular: Regular rhythm.  Lungs: Normal work of breathing. Neurologic: No focal deficits.   Lab Results  Component Value Date   CREATININE 1.30 (H) 12/21/2023   BUN 10 12/21/2023   NA 142 12/21/2023   K 4.9 12/21/2023   CL 102 12/21/2023   CO2 26 12/21/2023   Lab Results  Component Value Date   ALT 19 12/21/2023   AST 20 12/21/2023   ALKPHOS 97 12/21/2023   BILITOT 0.3 12/21/2023   Lab Results  Component Value Date   HGBA1C 5.5 12/21/2023   HGBA1C 5.5 05/03/2023   HGBA1C 5.4 11/24/2022   HGBA1C 5.5 07/21/2022   HGBA1C 5.3 02/08/2022   Lab Results  Component Value Date   INSULIN  22.7 12/21/2023   INSULIN  16.9 07/12/2023   INSULIN  23.0 11/24/2022   INSULIN  12.8 07/21/2022   INSULIN  25.4 (H) 02/08/2022   Lab Results  Component Value Date   TSH 1.070 02/08/2022   Lab Results  Component Value Date   CHOL 216 (H) 08/30/2023   HDL 39.40 08/30/2023   LDLCALC 154 (H) 08/30/2023   LDLDIRECT 141 (H) 12/11/2012   TRIG 116.0 08/30/2023   CHOLHDL 5 08/30/2023   Lab Results  Component Value Date   VD25OH 37.0 03/21/2024   VD25OH 58.3 12/21/2023   VD25OH 29.7 (L) 05/03/2023   Lab Results  Component Value Date   WBC 6.7 08/30/2023    HGB 14.6 08/30/2023   HCT 43.6 08/30/2023   MCV 82.8 08/30/2023   PLT 395.0 08/30/2023   No results found for: IRON, TIBC, FERRITIN  Attestation Statements:   Reviewed by clinician on day of visit: allergies, medications, problem list, medical history, surgical history, family history, social history, and previous encounter notes.  I have reviewed the above documentation for accuracy and completeness, and I agree with the above. -  Haroldine Redler d. Naasia Weilbacher, NP-C

## 2024-05-03 ENCOUNTER — Encounter (INDEPENDENT_AMBULATORY_CARE_PROVIDER_SITE_OTHER): Payer: Self-pay | Admitting: Adult Health

## 2024-05-03 DIAGNOSIS — E88819 Insulin resistance, unspecified: Secondary | ICD-10-CM

## 2024-05-04 ENCOUNTER — Other Ambulatory Visit (INDEPENDENT_AMBULATORY_CARE_PROVIDER_SITE_OTHER): Payer: Self-pay | Admitting: Adult Health

## 2024-05-06 NOTE — Telephone Encounter (Signed)
 NA

## 2024-05-07 ENCOUNTER — Telehealth (INDEPENDENT_AMBULATORY_CARE_PROVIDER_SITE_OTHER): Payer: Self-pay | Admitting: *Deleted

## 2024-05-07 MED ORDER — METFORMIN HCL ER 500 MG PO TB24: ORAL_TABLET | ORAL | 0 refills | Status: DC

## 2024-05-07 NOTE — Addendum Note (Signed)
 Addended byAnitra Ket, Daizha Anand L on: 05/07/2024 07:26 AM   Modules accepted: Orders

## 2024-05-07 NOTE — Telephone Encounter (Signed)
 Called patient concerning his message that the metformin  medication was supposed to be sent to pharmacy, and it was not there. I explained to him that we will be sending medication as discussed during office visit. Sent medication to pharmacy on file. He verbalized understanding.

## 2024-05-07 NOTE — Telephone Encounter (Signed)
 error

## 2024-05-30 ENCOUNTER — Other Ambulatory Visit (INDEPENDENT_AMBULATORY_CARE_PROVIDER_SITE_OTHER): Payer: Self-pay | Admitting: Adult Health

## 2024-05-30 DIAGNOSIS — E559 Vitamin D deficiency, unspecified: Secondary | ICD-10-CM

## 2024-06-01 ENCOUNTER — Other Ambulatory Visit (INDEPENDENT_AMBULATORY_CARE_PROVIDER_SITE_OTHER): Payer: Self-pay | Admitting: Adult Health

## 2024-06-01 DIAGNOSIS — E88819 Insulin resistance, unspecified: Secondary | ICD-10-CM

## 2024-06-13 ENCOUNTER — Ambulatory Visit (INDEPENDENT_AMBULATORY_CARE_PROVIDER_SITE_OTHER): Admitting: Adult Health

## 2024-06-13 ENCOUNTER — Encounter (INDEPENDENT_AMBULATORY_CARE_PROVIDER_SITE_OTHER): Payer: Self-pay | Admitting: Adult Health

## 2024-06-13 VITALS — BP 128/84 | HR 77 | Temp 98.4°F | Ht 70.0 in | Wt 370.0 lb

## 2024-06-13 DIAGNOSIS — E559 Vitamin D deficiency, unspecified: Secondary | ICD-10-CM

## 2024-06-13 DIAGNOSIS — I10 Essential (primary) hypertension: Secondary | ICD-10-CM

## 2024-06-13 DIAGNOSIS — E88819 Insulin resistance, unspecified: Secondary | ICD-10-CM | POA: Diagnosis not present

## 2024-06-13 DIAGNOSIS — Z6841 Body Mass Index (BMI) 40.0 and over, adult: Secondary | ICD-10-CM

## 2024-06-13 DIAGNOSIS — E669 Obesity, unspecified: Secondary | ICD-10-CM

## 2024-06-13 DIAGNOSIS — E66813 Obesity, class 3: Secondary | ICD-10-CM

## 2024-06-13 MED ORDER — METFORMIN HCL ER 500 MG PO TB24: ORAL_TABLET | ORAL | 0 refills | Status: DC

## 2024-06-13 MED ORDER — VITAMIN D (ERGOCALCIFEROL) 1.25 MG (50000 UNIT) PO CAPS: ORAL_CAPSULE | ORAL | 0 refills | Status: DC

## 2024-06-13 NOTE — Progress Notes (Signed)
 WEIGHT SUMMARY AND BIOMETRICS  Vitals Temp: 98.4 F (36.9 C) BP: 128/84 Pulse Rate: 77 SpO2: 96 %   Anthropometric Measurements Height: 5' 10 (1.778 m) Weight: (!) 370 lb (167.8 kg) BMI (Calculated): 53.09 Weight at Last Visit: 371 lb Weight Lost Since Last Visit: 1 lb Weight Gained Since Last Visit: 0 lb Starting Weight: 392 lb Total Weight Loss (lbs): 22 lb (9.979 kg) Peak Weight: 405 lb   Body Composition  Body Fat %: 40.6 % Fat Mass (lbs): 150.4 lbs Muscle Mass (lbs): 209.2 lbs Total Body Water (lbs): 148.2 lbs Visceral Fat Rating : 28   Other Clinical Data Fasting: no Labs: no Today's Visit #: 30 Starting Date: 08/11/23    Chief Complaint:   OBESITY Shawn [name] is here to discuss his progress with his obesity treatment plan.  He is on the the Category 4 Plan and states he is following his eating plan approximately 35-40 % of the time. He states he is exercising Cardiovascular Exercise and Strength Training 90 minutes 3 times per week.   Interim History:  He just returned home from beach vacation at [facility] He PROPOSED to his girlfriend. They are tentatively planning for a Fall 2026 wedding. He feels that this life milestone offers him motivation to improve his health.  He found a Wegovy  Coupon savings card and restarted Wegovy  2.4 mg on/about April 21, 2024  He used all 4 injections then was unable to continue due to cost- the savings card converted from $25/month to $499/month  Reviewed Bioimpedance Results with pt: Muscle Mass: +2.8 lbs Adipose Mass: -2.6 lbs  Subjective:   1. Essential hypertension BP at goal at OV He is on  hydrochlorothiazide  (HYDRODIURIL ) 25 MG tablet  lisinopril  (ZESTRIL ) 40 MG tablet  minoxidil (LONITEN) 2.5 MG tablet   2. Insulin  resistance  Latest Reference Range & Units 11/24/22 11:07 07/12/23 10:09 12/21/23 11:27  INSULIN  2.6 - 24.9 uIU/mL 23.0 16.9 22.7   He found a Wegovy  Coupon savings card and  restarted Wegovy  2.4 mg on/about April 21, 2024  He used all 4 injections then was unable to continue due to cost- the savings card converted from $25/month to $499/month  He has remained on Metformin  XR 500mg - he takes with largest meal of the day He denies GI upset with Metformin   3. Vitamin D  deficiency  Latest Reference Range & Units 05/03/23 15:38 12/21/23 11:27 03/21/24 15:10  Vitamin D , 25-Hydroxy 30.0 - 100.0 ng/mL 29.7 (L) 58.3 37.0  (L): Data is abnormally low  He is on weekly Ergocalciferol - Vit D slowly improving  Assessment/Plan:   1. Essential hypertension (Primary) Limit Na+ Continue regular exericse  2. Insulin  resistance Refill - metFORMIN  (GLUCOPHAGE -XR) 500 MG 24 hr tablet; 1 tab with largest meal  Dispense: 30 tablet; Refill: 0  3. Vitamin D  deficiency Refill - Vitamin D , Ergocalciferol , (DRISDOL ) 1.25 MG (50000 UNIT) CAPS capsule; TAKE ONE CAPSULE BY MOUTH ONCE A EVERY 7 DAYS  Dispense: 4 capsule; Refill: 0  4. Obesity with current BMI of 53.1 Remain off GLP-1 therapy  If his insurance begins to cover GLP-1 therapy- please send me a MyChart Message  Shawn [name] is currently in the action stage of change. As such, his goal is to continue with weight loss efforts. He has agreed to the Category 4 Plan.   Exercise goals: All adults should avoid inactivity. Some physical activity is better than none, and adults who participate in any amount of physical activity gain some health  benefits. Adults should also include muscle-strengthening activities that involve all major muscle groups on 2 or more days a week.  Behavioral modification strategies: increasing lean protein intake, decreasing simple carbohydrates, increasing vegetables, increasing water intake, no skipping meals, meal planning and cooking strategies, keeping healthy foods in the home, ways to avoid boredom eating, and planning for success.  Shawn [name] has agreed to follow-up with our clinic in 4 weeks. He was  informed of the importance of frequent follow-up visits to maximize his success with intensive lifestyle modifications for his multiple health conditions.   Objective:   Blood pressure 128/84, pulse 77, temperature 98.4 F (36.9 C), height 5' 10 (1.778 m), weight (!) 370 lb (167.8 kg), SpO2 96%. Body mass index is 53.09 kg/m.  General: Cooperative, alert, well developed, in no acute distress. HEENT: Conjunctivae and lids unremarkable. Cardiovascular: Regular rhythm.  Lungs: Normal work of breathing. Neurologic: No focal deficits.   Lab Results  Component Value Date   CREATININE 1.30 (H) 12/21/2023   BUN 10 12/21/2023   NA 142 12/21/2023   K 4.9 12/21/2023   CL 102 12/21/2023   CO2 26 12/21/2023   Lab Results  Component Value Date   ALT 19 12/21/2023   AST 20 12/21/2023   ALKPHOS 97 12/21/2023   BILITOT 0.3 12/21/2023   Lab Results  Component Value Date   HGBA1C 5.5 12/21/2023   HGBA1C 5.5 05/03/2023   HGBA1C 5.4 11/24/2022   HGBA1C 5.5 07/21/2022   HGBA1C 5.3 02/08/2022   Lab Results  Component Value Date   INSULIN  22.7 12/21/2023   INSULIN  16.9 07/12/2023   INSULIN  23.0 11/24/2022   INSULIN  12.8 07/21/2022   INSULIN  25.4 (H) 02/08/2022   Lab Results  Component Value Date   TSH 1.070 02/08/2022   Lab Results  Component Value Date   CHOL 216 (H) 08/30/2023   HDL 39.40 08/30/2023   LDLCALC 154 (H) 08/30/2023   LDLDIRECT 141 (H) 12/11/2012   TRIG 116.0 08/30/2023   CHOLHDL 5 08/30/2023   Lab Results  Component Value Date   VD25OH 37.0 03/21/2024   VD25OH 58.3 12/21/2023   VD25OH 29.7 (L) 05/03/2023   Lab Results  Component Value Date   WBC 6.7 08/30/2023   HGB 14.6 08/30/2023   HCT 43.6 08/30/2023   MCV 82.8 08/30/2023   PLT 395.0 08/30/2023   No results found for: IRON, TIBC, FERRITIN  Attestation Statements:   Reviewed by clinician on day of visit: allergies, medications, problem list, medical history, surgical history, family  history, social history, and previous encounter notes.  I have reviewed the above documentation for accuracy and completeness, and I agree with the above. -  Jakyrie Totherow d. Ardel Jagger, NP-C

## 2024-07-23 ENCOUNTER — Ambulatory Visit (INDEPENDENT_AMBULATORY_CARE_PROVIDER_SITE_OTHER): Admitting: Adult Health

## 2024-07-23 ENCOUNTER — Other Ambulatory Visit (INDEPENDENT_AMBULATORY_CARE_PROVIDER_SITE_OTHER): Payer: Self-pay | Admitting: Adult Health

## 2024-07-23 ENCOUNTER — Encounter (INDEPENDENT_AMBULATORY_CARE_PROVIDER_SITE_OTHER): Payer: Self-pay | Admitting: Adult Health

## 2024-07-23 VITALS — BP 124/87 | HR 85 | Temp 98.5°F | Ht 70.0 in | Wt 384.0 lb

## 2024-07-23 DIAGNOSIS — E66813 Obesity, class 3: Secondary | ICD-10-CM

## 2024-07-23 DIAGNOSIS — R7989 Other specified abnormal findings of blood chemistry: Secondary | ICD-10-CM | POA: Diagnosis not present

## 2024-07-23 DIAGNOSIS — E559 Vitamin D deficiency, unspecified: Secondary | ICD-10-CM

## 2024-07-23 DIAGNOSIS — E88819 Insulin resistance, unspecified: Secondary | ICD-10-CM | POA: Diagnosis not present

## 2024-07-23 DIAGNOSIS — G4733 Obstructive sleep apnea (adult) (pediatric): Secondary | ICD-10-CM

## 2024-07-23 DIAGNOSIS — I10 Essential (primary) hypertension: Secondary | ICD-10-CM | POA: Diagnosis not present

## 2024-07-23 DIAGNOSIS — E669 Obesity, unspecified: Secondary | ICD-10-CM

## 2024-07-23 DIAGNOSIS — Z6841 Body Mass Index (BMI) 40.0 and over, adult: Secondary | ICD-10-CM

## 2024-07-23 MED ORDER — ZEPBOUND 2.5 MG/0.5ML ~~LOC~~ SOAJ: 2.5000 mg | SUBCUTANEOUS | 0 refills | Status: DC

## 2024-07-23 NOTE — Progress Notes (Signed)
 WEIGHT SUMMARY AND BIOMETRICS  Vitals Temp: 98.5 F (36.9 C) BP: 124/87 Pulse Rate: 85 SpO2: 98 %   Anthropometric Measurements Height: 5' 10 (1.778 m) Weight: (!) 384 lb (174.2 kg) BMI (Calculated): 55.1 Weight at Last Visit: 370lb Weight Lost Since Last Visit: 0lb Weight Gained Since Last Visit: 14lb Starting Weight: 392lb Total Weight Loss (lbs): 8 lb (3.629 kg) Peak Weight: 405lb   Body Composition  Body Fat %: 43.9 % Fat Mass (lbs): 168.8 lbs Muscle Mass (lbs): 205.2 lbs Total Body Water (lbs): 158 lbs Visceral Fat Rating : 32   Other Clinical Data Fasting: No Labs: No Today's Visit #: 37 Starting Date: 08/11/23    Chief Complaint:   OBESITY Shawn [name] is here to discuss his progress with his obesity treatment plan.  He is on the the Category 4 Plan and states he is following his eating plan approximately 25 % of the time.  He states he is exercising Yard Work (cutting grass) 90 minutes 4 times per week.  Interim History:  38 year old maternal grandfather suffered a stroke mid August 2025 Grandfather is now home stable, he lives independently   He recently returned home 6 days in [place]- all inclusive He reports consuming the following foods while in [place] with [place] with vegetables Seafood Salads at dinner Red meat Diet Soda (Diet Coke), water, mixed drinks  Averaged 7-9K steps per day Water sports  Returned to work today.  Recommend checking RMR via IC at next OV  Of note- He found a Wegovy  Coupon savings card and restarted Wegovy  2.4 mg on/about April 21, 2024   He used all 4 injections then was unable to continue due to cost- the savings card converted from $25/month to $499/month  Discussed Zepbound  therapy- agreeable to start if covered by insurance  Subjective:   1. Elevated serum creatinine  Latest Reference Range & Units 11/24/22 11:07 07/12/23 10:09 12/21/23 11:27  Creatinine 0.76 - 1.27 mg/dL 8.86 8.67 (H)  8.69 (H)  (H): Data is abnormally high  2. Essential hypertension BP at goal at OV  3. Insulin  resistance  Latest Reference Range & Units 11/24/22 11:07 07/12/23 10:09 12/21/23 11:27  INSULIN  2.6 - 24.9 uIU/mL 23.0 16.9 22.7   He has remained on daily Metformin  XR 500mg  He denies GI upest  4. Vitamin D  deficiency  Latest Reference Range & Units 05/03/23 15:38 12/21/23 11:27 03/21/24 15:10  Vitamin D , 25-Hydroxy 30.0 - 100.0 ng/mL 29.7 (L) 58.3 37.0  (L): Data is abnormally low  He is on weekly Ergocalciferol - denies N/V/Muscle Weakness  5. OSA [facility]   HOME SLEEP TEST (Watch PAT)   STUDY DATE: 10/26/20   DOB: 1986/07/24   MRN: [id number]   ORDERING CLINICIAN: True Mar, MD, PhD    REFERRING CLINICIAN: Dr. Lockie   CLINICAL INFORMATION/HISTORY: 38 year old man with a history of  hypertension, back pain, allergies, anxiety, depression, knee  pain, vitamin D  deficiency and morbid obesity with a BMI of over  50, who reports snoring and excessive daytime somnolence.   Epworth sleepiness score: 12/24.   BMI: 56.5 kg/m   FINDINGS:   Total Record Time (hours, min): 8 H 55 min   Total Sleep Time (hours, min):  6 H 47 min   Percent REM (%):    12.53 %   Calculated pAHI (per hour):   7.6      REM pAHI:    18.4     NREM  pAHI:  6.2    Oxygen Saturation (%) Mean: 96  Minimum oxygen saturation (%):         88   O2 Saturation Range (%): 88-100  O2Saturation (minutes) <=88%: 0  min   Pulse Mean (bpm):    72  Pulse Range (49-135)   IMPRESSION: OSA (obstructive sleep apnea), mild   RECOMMENDATION:  This home sleep test demonstrates overall mild obstructive sleep  apnea with a total AHI of 7.6/hour and O2 nadir of 88%. Mild to  moderate snoring was noted. Given the patient's medical history  and sleep related complaints, treatment with positive airway  pressure is recommended. This can be achieved in the form of  autoPAP trial/titration at  home. A full night CPAP titration  study will help with proper treatment settings and mask fitting,  if needed, down the road. Alternative treatments include weight  loss along with avoidance of the supine sleep position, or an  oral appliance in appropriate candidates.   Please note that untreated obstructive sleep apnea may carry  additional perioperative morbidity. Patients with significant  obstructive sleep apnea should receive perioperative PAP therapy  and the surgeons and particularly the anesthesiologist should be  informed of the diagnosis and the severity of the sleep  disordered breathing.  The patient should be cautioned not to drive, work at heights, or  operate dangerous or heavy equipment when tired or sleepy. Review  and reiteration of good sleep hygiene measures should be pursued  with any patient.  Other causes of the patient's symptoms, including circadian  rhythm disturbances, an underlying mood disorder, medication  effect and/or an underlying medical problem cannot be ruled out  based on this test. Clinical correlation is recommended.   The patient and his referring provider will be notified of the  test results. The patient will be seen in follow up in sleep  clinic at [facility].   I certify that I have reviewed the raw data recording prior to  the issuance of this report in accordance with the standards of  the American Academy of Sleep Medicine (AASM).   Of Note- He denies family hx of MENS 2 or MTC He denies personal hx of pancreatitis Discussed adding on Zepbound  to treat OSA and morbid obesity  Assessment/Plan:   1. Elevated serum creatinine Take antihypertensive therapy daily   2. Essential hypertension (Primary) Take antihypertensive therapy daily   3. Insulin  resistance Refill  metFORMIN  (GLUCOPHAGE -XR) 500 MG 24 hr tablet 1 tab with largest meal Dispense: 30 tablet, Refills: 0 ordered   4. Vitamin D  deficiency Refill  Vitamin D ,  Ergocalciferol , (DRISDOL ) 1.25 MG (50000 UNIT) CAPS capsule TAKE ONE CAPSULE BY MOUTH ONCE A EVERY 7 DAYS Dispense: 4 capsule, Refills: 0 ordered   5. OSA Start tirzepatide  (ZEPBOUND ) 2.5 MG/0.5ML Pen Inject 2.5 mg into the skin once a week. Dispense: 3 mL, Refills: 0 ordered   6. Obesity with current BMI of 55.2  Shawn [name] is not currently in the action stage of change. As such, his goal is to get back to weightloss efforts . He has agreed to the Category 4 Plan.   Exercise goals: All adults should avoid inactivity. Some physical activity is better than none, and adults who participate in any amount of physical activity gain some health benefits. Adults should also include muscle-strengthening activities that involve all major muscle groups on 2 or more days a week.  Behavioral modification strategies: increasing lean protein intake, decreasing simple carbohydrates, increasing vegetables, increasing water intake, decreasing  liquid calories, decreasing alcohol intake, no skipping meals, meal planning and cooking strategies, keeping healthy foods in the home, ways to avoid boredom eating, better snacking choices, emotional eating strategies, planning for success, and decreasing junk food.  Shawn [name] has agreed to follow-up with our clinic in 4 weeks. He was informed of the importance of frequent follow-up visits to maximize his success with intensive lifestyle modifications for his multiple health conditions.   Check Fasting Labs and IC at next OV- pt aware to arrive early and to be fasting   Objective:   Blood pressure 124/87, pulse 85, temperature 98.5 F (36.9 C), height 5' 10 (1.778 m), weight (!) 384 lb (174.2 kg), SpO2 98%. Body mass index is 55.1 kg/m.  General: Cooperative, alert, well developed, in no acute distress. HEENT: Conjunctivae and lids unremarkable. Cardiovascular: Regular rhythm.  Lungs: Normal work of breathing. Neurologic: No focal deficits.   Lab Results   Component Value Date   CREATININE 1.30 (H) 12/21/2023   BUN 10 12/21/2023   NA 142 12/21/2023   K 4.9 12/21/2023   CL 102 12/21/2023   CO2 26 12/21/2023   Lab Results  Component Value Date   ALT 19 12/21/2023   AST 20 12/21/2023   ALKPHOS 97 12/21/2023   BILITOT 0.3 12/21/2023   Lab Results  Component Value Date   HGBA1C 5.5 12/21/2023   HGBA1C 5.5 05/03/2023   HGBA1C 5.4 11/24/2022   HGBA1C 5.5 07/21/2022   HGBA1C 5.3 02/08/2022   Lab Results  Component Value Date   INSULIN  22.7 12/21/2023   INSULIN  16.9 07/12/2023   INSULIN  23.0 11/24/2022   INSULIN  12.8 07/21/2022   INSULIN  25.4 (H) 02/08/2022   Lab Results  Component Value Date   TSH 1.070 02/08/2022   Lab Results  Component Value Date   CHOL 216 (H) 08/30/2023   HDL 39.40 08/30/2023   LDLCALC 154 (H) 08/30/2023   LDLDIRECT 141 (H) 12/11/2012   TRIG 116.0 08/30/2023   CHOLHDL 5 08/30/2023   Lab Results  Component Value Date   VD25OH 37.0 03/21/2024   VD25OH 58.3 12/21/2023   VD25OH 29.7 (L) 05/03/2023   Lab Results  Component Value Date   WBC 6.7 08/30/2023   HGB 14.6 08/30/2023   HCT 43.6 08/30/2023   MCV 82.8 08/30/2023   PLT 395.0 08/30/2023   No results found for: IRON, TIBC, FERRITIN  Attestation Statements:   Reviewed by clinician on day of visit: allergies, medications, problem list, medical history, surgical history, family history, social history, and previous encounter notes.  I have reviewed the above documentation for accuracy and completeness, and I agree with the above. -  Wafaa Deemer d. Huxley Shurley, NP-C

## 2024-07-24 ENCOUNTER — Telehealth (INDEPENDENT_AMBULATORY_CARE_PROVIDER_SITE_OTHER): Payer: Self-pay | Admitting: *Deleted

## 2024-07-24 NOTE — Telephone Encounter (Signed)
 Shawn [name] (Key: [id number])  [employer] has not yet replied to your PA request. Depending on the information you've provided, additional questions may be returned by the plan. You may close this dialog, return to your dashboard, and perform other tasks.  To check for an update later, open this request again from your dashboard.  If [employer] has not replied to your request within 24 hours please contact [employer] at [phone].

## 2024-07-25 NOTE — Telephone Encounter (Signed)
 Pt came in for a f/u visit.

## 2024-08-04 ENCOUNTER — Other Ambulatory Visit: Payer: Self-pay | Admitting: Family

## 2024-08-04 DIAGNOSIS — I10 Essential (primary) hypertension: Secondary | ICD-10-CM

## 2024-08-15 ENCOUNTER — Ambulatory Visit (INDEPENDENT_AMBULATORY_CARE_PROVIDER_SITE_OTHER): Admitting: Adult Health

## 2024-08-15 ENCOUNTER — Telehealth (INDEPENDENT_AMBULATORY_CARE_PROVIDER_SITE_OTHER): Payer: Self-pay | Admitting: *Deleted

## 2024-08-15 ENCOUNTER — Encounter (INDEPENDENT_AMBULATORY_CARE_PROVIDER_SITE_OTHER): Payer: Self-pay | Admitting: Adult Health

## 2024-08-15 VITALS — BP 132/88 | HR 65 | Temp 98.3°F | Ht 70.0 in | Wt 381.0 lb

## 2024-08-15 DIAGNOSIS — E88819 Insulin resistance, unspecified: Secondary | ICD-10-CM | POA: Diagnosis not present

## 2024-08-15 DIAGNOSIS — R0602 Shortness of breath: Secondary | ICD-10-CM | POA: Diagnosis not present

## 2024-08-15 DIAGNOSIS — E669 Obesity, unspecified: Secondary | ICD-10-CM

## 2024-08-15 DIAGNOSIS — E559 Vitamin D deficiency, unspecified: Secondary | ICD-10-CM

## 2024-08-15 DIAGNOSIS — G4733 Obstructive sleep apnea (adult) (pediatric): Secondary | ICD-10-CM

## 2024-08-15 DIAGNOSIS — Z6841 Body Mass Index (BMI) 40.0 and over, adult: Secondary | ICD-10-CM

## 2024-08-15 DIAGNOSIS — R7989 Other specified abnormal findings of blood chemistry: Secondary | ICD-10-CM | POA: Diagnosis not present

## 2024-08-15 MED ORDER — VITAMIN D (ERGOCALCIFEROL) 1.25 MG (50000 UNIT) PO CAPS: ORAL_CAPSULE | ORAL | 0 refills | Status: DC

## 2024-08-15 NOTE — Telephone Encounter (Signed)
 Called patient to make aware of his IC testing/labs today and to make sure that he is supposed to be fasting and to come in 30 mins prior to appt. Time/left voice message, no answer.

## 2024-08-15 NOTE — Progress Notes (Addendum)
 WEIGHT SUMMARY AND BIOMETRICS  Vitals Temp: 98.3 F (36.8 C) BP: 132/88 Pulse Rate: 65 SpO2: 100 %   Anthropometric Measurements Height: 5' 10 (1.778 m) Weight: (!) 381 lb (172.8 kg) BMI (Calculated): 54.67 Weight at Last Visit: 384 lb Weight Lost Since Last Visit: 3 lb Weight Gained Since Last Visit: 0 Starting Weight: 392 lb Total Weight Loss (lbs): 11 lb (4.99 kg) Peak Weight: 405 lb   Body Composition  Body Fat %: 43.7 % Fat Mass (lbs): 166.4 lbs Muscle Mass (lbs): 204.2 lbs Total Body Water (lbs): 157.4 lbs Visceral Fat Rating : 32   Other Clinical Data Fasting: yes Labs: yes Today's Visit #: 45 Starting Date: 08/11/23    Chief Complaint:   OBESITY Shawn [name] is here to discuss his progress with his obesity treatment plan.  He is on the the Category 4 Plan and states he is following his eating plan approximately 40 % of the time.  He states he is exercising Yard Work 60 minutes 3 times per week.  Interim History:  Oct 2026- tentative [place] date [place]  He and his fiancee are saving for wedding expenses and out of pocket Zepbound  expenses are not feasible   IC and fasting labs completed today.  He reports hours of vigorous yard work during weekends.  Subjective:   1. Shortness of breath He endorses dyspnea with extreme exertion, denies CP  08/10/20 10:00  RMR 2506    08/15/24 14:00  RMR 2477   Metabolism slightly decreased and below anticipated.  2. Vitamin D  deficiency  Latest Reference Range & Units 05/03/23 15:38 12/21/23 11:27 03/21/24 15:10  Vitamin D , 25-Hydroxy 30.0 - 100.0 ng/mL 29.7 (L) 58.3 37.0  (L): Data is abnormally low  Vit D Low normal and below goal of 50-70. He is on weekly Ergocalciferol - encouraged to set a reminder to take each week  3. OSA (obstructive sleep apnea) 10/26/2020 RECOMMENDATION:  This home sleep test demonstrates overall mild obstructive sleep  apnea with a total  AHI of 7.6/hour and O2 nadir of 88%. Mild to  moderate snoring was noted. Given the patient's medical history  and sleep related complaints, treatment with positive airway  pressure is recommended. This can be achieved in the form of  autoPAP trial/titration at home. A full night CPAP titration  study will help with proper treatment settings and mask fitting,   He has CPAP device, has not used it in years He feels that the face mask is too restrictive  4. Insulin  resistance Lab Results  Component Value Date   HGBA1C 5.5 12/21/2023   HGBA1C 5.5 05/03/2023   HGBA1C 5.4 11/24/2022      Latest Reference Range & Units 11/24/22 11:07 07/12/23 10:09 12/21/23 11:27  INSULIN  2.6 - 24.9 uIU/mL 23.0 16.9 22.7   He is inconsistently taking daily Metformin  XR 500mg  When he does take, he denise GI upset  5. Elevated Serum Creat  Latest Reference Range & Units 11/24/22 11:07 07/12/23 10:09 12/21/23 11:27  Creatinine 0.76 - 1.27 mg/dL 8.86 8.67 (H) 8.69 (H)  (H): Data is abnormally high  Latest Reference Range & Units 11/24/22 11:07 07/12/23 10:09 12/21/23 11:27  eGFR >59 mL/min/1.73 87 73 73   BP stable at OV Kidney fx improved  PCP manages EPINEPHrine  (EPIPEN  2-PAK) 0.3 mg/0.3 mL IJ SOAJ injection  hydrochlorothiazide  (HYDRODIURIL ) 25 MG tablet  lisinopril  (ZESTRIL ) 40 MG tablet  minoxidil (LONITEN) 2.5 MG tablet   Assessment/Plan:  1. Shortness of breath Continue Cat 4 MP and add 100 snack calories (for a total of 400 snack cal daily) Increase regular exercise  2. Vitamin D  deficiency (Primary) Check Labs -Vit D Refill Vitamin D , Ergocalciferol , (DRISDOL ) 1.25 MG (50000 UNIT) CAPS capsule TAKE ONE CAPSULE BY MOUTH ONCE A EVERY 7 DAYS Dispense: 4 capsule, Refills: 0 ordered   3. OSA (obstructive sleep apnea) Continue with weight loss efforts Recommend using CPAP nightly  4. Insulin  resistance Check Labs -A1c -Insulin  - Mg++ - B12  5. Elevated Serum Creat Check  Labs -CMP Avoid Nephrotoxic substances  6. Obesity with current BMI of 54.7  Yigit is not currently in the action stage of change. As such, his goal is to get back to weightloss efforts . He has agreed to the Category 4 Plan. +100  Exercise goals: All adults should avoid inactivity. Some physical activity is better than none, and adults who participate in any amount of physical activity gain some health benefits. Adults should also include muscle-strengthening activities that involve all major muscle groups on 2 or more days a week.  Behavioral modification strategies: increasing lean protein intake, decreasing simple carbohydrates, increasing vegetables, increasing water intake, decreasing eating out, no skipping meals, meal planning and cooking strategies, keeping healthy foods in the home, ways to avoid boredom eating, ways to avoid night time snacking, better snacking choices, and planning for success.  Donnivan has agreed to follow-up with our clinic in 4 weeks. He was informed of the importance of frequent follow-up visits to maximize his success with intensive lifestyle modifications for his multiple health conditions.   Motty was informed we would discuss his lab results at his next visit unless there is a critical issue that needs to be addressed sooner. Tali agreed to keep his next visit at the agreed upon time to discuss these results.  Objective:   Blood pressure 132/88, pulse 65, temperature 98.3 F (36.8 C), height 5' 10 (1.778 m), weight (!) 381 lb (172.8 kg), SpO2 100%. Body mass index is 54.67 kg/m.  General: Cooperative, alert, well developed, in no acute distress. HEENT: Conjunctivae and lids unremarkable. Cardiovascular: Regular rhythm.  Lungs: Normal work of breathing. Neurologic: No focal deficits.   Lab Results  Component Value Date   CREATININE 1.30 (H) 12/21/2023   BUN 10 12/21/2023   NA 142 12/21/2023   K 4.9 12/21/2023   CL 102 12/21/2023    CO2 26 12/21/2023   Lab Results  Component Value Date   ALT 19 12/21/2023   AST 20 12/21/2023   ALKPHOS 97 12/21/2023   BILITOT 0.3 12/21/2023   Lab Results  Component Value Date   HGBA1C 5.5 12/21/2023   HGBA1C 5.5 05/03/2023   HGBA1C 5.4 11/24/2022   HGBA1C 5.5 07/21/2022   HGBA1C 5.3 02/08/2022   Lab Results  Component Value Date   INSULIN  22.7 12/21/2023   INSULIN  16.9 07/12/2023   INSULIN  23.0 11/24/2022   INSULIN  12.8 07/21/2022   INSULIN  25.4 (H) 02/08/2022   Lab Results  Component Value Date   TSH 1.070 02/08/2022   Lab Results  Component Value Date   CHOL 216 (H) 08/30/2023   HDL 39.40 08/30/2023   LDLCALC 154 (H) 08/30/2023   LDLDIRECT 141 (H) 12/11/2012   TRIG 116.0 08/30/2023   CHOLHDL 5 08/30/2023   Lab Results  Component Value Date   VD25OH 37.0 03/21/2024   VD25OH 58.3 12/21/2023   VD25OH 29.7 (L) 05/03/2023   Lab Results  Component Value Date  WBC 6.7 08/30/2023   HGB 14.6 08/30/2023   HCT 43.6 08/30/2023   MCV 82.8 08/30/2023   PLT 395.0 08/30/2023   No results found for: IRON, TIBC, FERRITIN  Attestation Statements:   Reviewed by clinician on day of visit: allergies, medications, problem list, medical history, surgical history, family history, social history, and previous encounter notes.  I have reviewed the above documentation for accuracy and completeness, and I agree with the above. -  Declyn Delsol d. Malisa Ruggiero, NP-C

## 2024-08-16 LAB — COMPREHENSIVE METABOLIC PANEL WITH GFR
ALT: 14 IU/L (ref 0–44)
AST: 21 IU/L (ref 0–40)
Albumin: 4.1 g/dL (ref 4.1–5.1)
Alkaline Phosphatase: 84 IU/L (ref 47–123)
BUN/Creatinine Ratio: 9 (ref 9–20)
BUN: 10 mg/dL (ref 6–20)
Bilirubin Total: 0.4 mg/dL (ref 0.0–1.2)
CO2: 25 mmol/L (ref 20–29)
Calcium: 9.4 mg/dL (ref 8.7–10.2)
Chloride: 99 mmol/L (ref 96–106)
Creatinine, Ser: 1.1 mg/dL (ref 0.76–1.27)
Globulin, Total: 2.9 g/dL (ref 1.5–4.5)
Glucose: 77 mg/dL (ref 70–99)
Potassium: 4.1 mmol/L (ref 3.5–5.2)
Sodium: 137 mmol/L (ref 134–144)
Total Protein: 7 g/dL (ref 6.0–8.5)
eGFR: 89 mL/min/1.73 (ref >59)

## 2024-08-16 LAB — INSULIN, RANDOM: INSULIN: 13.8 u[IU]/mL (ref 2.6–24.9)

## 2024-08-16 LAB — VITAMIN B12: Vitamin B-12: 463 pg/mL (ref 232–1245)

## 2024-08-16 LAB — MAGNESIUM: Magnesium: 1.9 mg/dL (ref 1.6–2.3)

## 2024-08-16 LAB — VITAMIN D 25 HYDROXY (VIT D DEFICIENCY, FRACTURES): Vit D, 25-Hydroxy: 26.5 ng/mL — ABNORMAL LOW (ref 30.0–100.0)

## 2024-08-16 LAB — HEMOGLOBIN A1C
Est. average glucose Bld gHb Est-mCnc: 111 mg/dL
Hgb A1c MFr Bld: 5.5 % (ref 4.8–5.6)

## 2024-09-12 ENCOUNTER — Ambulatory Visit (INDEPENDENT_AMBULATORY_CARE_PROVIDER_SITE_OTHER): Admitting: Adult Health

## 2024-10-10 ENCOUNTER — Encounter (INDEPENDENT_AMBULATORY_CARE_PROVIDER_SITE_OTHER): Payer: Self-pay | Admitting: Adult Health

## 2024-10-14 DIAGNOSIS — M7918 Myalgia, other site: Secondary | ICD-10-CM | POA: Diagnosis not present

## 2024-10-14 DIAGNOSIS — M9905 Segmental and somatic dysfunction of pelvic region: Secondary | ICD-10-CM | POA: Diagnosis not present

## 2024-10-14 DIAGNOSIS — M9903 Segmental and somatic dysfunction of lumbar region: Secondary | ICD-10-CM | POA: Diagnosis not present

## 2024-10-14 DIAGNOSIS — M25651 Stiffness of right hip, not elsewhere classified: Secondary | ICD-10-CM | POA: Diagnosis not present

## 2024-10-14 DIAGNOSIS — M9904 Segmental and somatic dysfunction of sacral region: Secondary | ICD-10-CM | POA: Diagnosis not present

## 2024-10-14 DIAGNOSIS — M25652 Stiffness of left hip, not elsewhere classified: Secondary | ICD-10-CM | POA: Diagnosis not present

## 2024-10-14 DIAGNOSIS — M9901 Segmental and somatic dysfunction of cervical region: Secondary | ICD-10-CM | POA: Diagnosis not present

## 2024-10-23 ENCOUNTER — Ambulatory Visit (INDEPENDENT_AMBULATORY_CARE_PROVIDER_SITE_OTHER): Admitting: Adult Health

## 2024-10-23 ENCOUNTER — Encounter (INDEPENDENT_AMBULATORY_CARE_PROVIDER_SITE_OTHER): Payer: Self-pay | Admitting: Adult Health

## 2024-10-23 VITALS — BP 131/78 | HR 85 | Temp 98.6°F | Ht 70.0 in | Wt 386.0 lb

## 2024-10-23 DIAGNOSIS — R7989 Other specified abnormal findings of blood chemistry: Secondary | ICD-10-CM

## 2024-10-23 DIAGNOSIS — E88819 Insulin resistance, unspecified: Secondary | ICD-10-CM

## 2024-10-23 DIAGNOSIS — E669 Obesity, unspecified: Secondary | ICD-10-CM | POA: Diagnosis not present

## 2024-10-23 DIAGNOSIS — E66813 Obesity, class 3: Secondary | ICD-10-CM

## 2024-10-23 DIAGNOSIS — Z6841 Body Mass Index (BMI) 40.0 and over, adult: Secondary | ICD-10-CM

## 2024-10-23 DIAGNOSIS — E559 Vitamin D deficiency, unspecified: Secondary | ICD-10-CM | POA: Diagnosis not present

## 2024-10-23 MED ORDER — LIRAGLUTIDE -WEIGHT MANAGEMENT 18 MG/3ML ~~LOC~~ SOPN: PEN_INJECTOR | SUBCUTANEOUS | 0 refills | Status: AC

## 2024-10-23 MED ORDER — METFORMIN HCL ER 500 MG PO TB24: ORAL_TABLET | ORAL | 0 refills | Status: DC

## 2024-10-23 MED ORDER — VITAMIN D (ERGOCALCIFEROL) 1.25 MG (50000 UNIT) PO CAPS: ORAL_CAPSULE | ORAL | 0 refills | Status: DC

## 2024-10-23 NOTE — Progress Notes (Signed)
 WEIGHT SUMMARY AND BIOMETRICS  Vitals Temp: 98.6 F (37 C) BP: 131/78 Pulse Rate: 85 SpO2: 100 %   Anthropometric Measurements Height: 5' 10 (1.778 m) Weight: (!) 386 lb (175.1 kg) BMI (Calculated): 55.39 Weight at Last Visit: 381lb Weight Lost Since Last Visit: 0lb Weight Gained Since Last Visit: 5lb Starting Weight: 392lb Total Weight Loss (lbs): 6 lb (2.722 kg) Peak Weight: 485lb   Body Composition  Body Fat %: 3.2 % Fat Mass (lbs): 166.8 lbs Muscle Mass (lbs): 208.8 lbs Total Body Water (lbs): 156 lbs Visceral Fat Rating : 32   Other Clinical Data Fasting: No Labs: No Today's Visit #: 58 Starting Date: 08/10/20    Chief Complaint:   OBESITY Shawn [name] is here to discuss his progress with his obesity treatment plan.  He is on the the Category 3 Plan and states he is following his eating plan approximately 0 % of the time.  He states he is exercising: NEAT Activities  Interim History:  Last use of GLP-1 was Wegovy  2.4mg  in June 2025.  His insurance will cover daily GLP-1/Saxenda   He denies family hx of MENS 2 or MTC He denies personal hx of pancreatitis.  He is inconsistently taking daily Metformin  XR 500mg   Stress-  He was furloughed for 6 weeks during the government shutdown. He focused on house work and wedding planning.  He and his fiancee have set a date a secured a location: 08/23/2025 at [facility] in [place], [place]  Subjective:   1. Elevated serum creatinine Discussed Labs 08/15/2024 CMP: Electrolytes, Kidney Fx, and Liver Enzymes- stable BP stable at OV  2. Vitamin D  deficiency Discussed Labs  Latest Reference Range & Units 08/15/24 15:04  Vitamin D , 25-Hydroxy 30.0 - 100.0 ng/mL 26.5 (L)  (L): Data is abnormally low  Vit D level well below goal of 50-70 He is once weekly Ergocalciferol   3. Insulin  resistance Discussed Labs  Latest Reference Range & Units 08/15/24 15:04  Glucose 70 - 99 mg/dL 77  Hemoglobin J8R 4.8 -  5.6 % 5.5  Est. average glucose Bld gHb Est-mCnc mg/dL 888  INSULIN  2.6 - 24.9 uIU/mL 13.8    CBG, A1c both at goal Insulin  level greatly  improved and juts above goal of 5  Last use of GLP-1 was Wegovy  2.4mg  in June 2025.  His insurance will cover daily GLP-1/Saxenda   He denies family hx of MENS 2 or MTC He denies personal hx of pancreatitis.  He is inconsistently taking daily Metformin  XR 500mg   Assessment/Plan:   1. Elevated serum creatinine Avoid Nephrotoxic substances Monitor Labs  2. Vitamin D  deficiency Refill  Vitamin D , Ergocalciferol , (DRISDOL ) 1.25 MG (50000 UNIT) CAPS capsule TAKE ONE CAPSULE BY MOUTH ONCE A EVERY 7 DAYS Dispense: 4 capsule, Refills: 0 ordered   3. Insulin  resistance Start  Liraglutide  -Weight Management (SAXENDA ) 18 MG/3ML SOPN Inject 0.6 mg into the skin daily for 7 days, THEN 1.2 mg daily for 7 days, THEN 1.8 mg daily for 7 days, THEN 2.4 mg daily for 7 days. Dispense: 7 mL, Refills: 0 ordered  Refill metFORMIN  (GLUCOPHAGE -XR) 500 MG 24 hr tablet 1 tab with largest meal Dispense: 30 tablet, Refills: 0 ordered   6. Obesity with current BMI of 55.4 Start  Liraglutide  -Weight Management (SAXENDA ) 18 MG/3ML SOPN Inject 0.6 mg into the skin daily for 7 days, THEN 1.2 mg daily for 7 days, THEN 1.8 mg daily for 7 days, THEN 2.4 mg daily for 7 days. Dispense:  7 mL, Refills: 0 ordered   Frankie is currently in the action stage of change. As such, his goal is to continue with weight loss efforts. He has agreed to the Category 4 Plan.   Exercise goals: For substantial health benefits, adults should do at least 150 minutes (2 hours and 30 minutes) a week of moderate-intensity, or 75 minutes (1 hour and 15 minutes) a week of vigorous-intensity aerobic physical activity, or an equivalent combination of moderate- and vigorous-intensity aerobic activity. Aerobic activity should be performed in episodes of at least 10 minutes, and preferably, it should be  spread throughout the week.  Behavioral modification strategies: increasing lean protein intake, decreasing simple carbohydrates, increasing vegetables, increasing water intake, meal planning and cooking strategies, keeping healthy foods in the home, ways to avoid boredom eating, and planning for success.  [place] has agreed to follow-up with our clinic in 4 weeks. He was informed of the importance of frequent follow-up visits to maximize his success with intensive lifestyle modifications for his multiple health conditions.   Objective:   Blood pressure 131/78, pulse 85, temperature 98.6 F (37 C), height 5' 10 (1.778 m), weight (!) 386 lb (175.1 kg), SpO2 100%. Body mass index is 55.39 kg/m.  General: Cooperative, alert, well developed, in no acute distress. HEENT: Conjunctivae and lids unremarkable. Cardiovascular: Regular rhythm.  Lungs: Normal work of breathing. Neurologic: No focal deficits.   Lab Results  Component Value Date   CREATININE 1.10 08/15/2024   BUN 10 08/15/2024   NA 137 08/15/2024   K 4.1 08/15/2024   CL 99 08/15/2024   CO2 25 08/15/2024   Lab Results  Component Value Date   ALT 14 08/15/2024   AST 21 08/15/2024   ALKPHOS 84 08/15/2024   BILITOT 0.4 08/15/2024   Lab Results  Component Value Date   HGBA1C 5.5 08/15/2024   HGBA1C 5.5 12/21/2023   HGBA1C 5.5 05/03/2023   HGBA1C 5.4 11/24/2022   HGBA1C 5.5 07/21/2022   Lab Results  Component Value Date   INSULIN  13.8 08/15/2024   INSULIN  22.7 12/21/2023   INSULIN  16.9 07/12/2023   INSULIN  23.0 11/24/2022   INSULIN  12.8 07/21/2022   Lab Results  Component Value Date   TSH 1.070 02/08/2022   Lab Results  Component Value Date   CHOL 216 (H) 08/30/2023   HDL 39.40 08/30/2023   LDLCALC 154 (H) 08/30/2023   LDLDIRECT 141 (H) 12/11/2012   TRIG 116.0 08/30/2023   CHOLHDL 5 08/30/2023   Lab Results  Component Value Date   VD25OH 26.5 (L) 08/15/2024   VD25OH 37.0 03/21/2024   VD25OH 58.3  12/21/2023   Lab Results  Component Value Date   WBC 6.7 08/30/2023   HGB 14.6 08/30/2023   HCT 43.6 08/30/2023   MCV 82.8 08/30/2023   PLT 395.0 08/30/2023   No results found for: IRON, TIBC, FERRITIN  Attestation Statements:   Reviewed by clinician on day of visit: allergies, medications, problem list, medical history, surgical history, family history, social history, and previous encounter notes.  I have reviewed the above documentation for accuracy and completeness, and I agree with the above. -  Daviel Allegretto d. Cashawn Yanko, NP-C

## 2024-10-24 ENCOUNTER — Telehealth (INDEPENDENT_AMBULATORY_CARE_PROVIDER_SITE_OTHER): Payer: Self-pay

## 2024-10-24 NOTE — Telephone Encounter (Signed)
 Shawn [name] Liraglutide -Weight Management 18MG /3ML pen-injectors (Key: [id number]) PA Case ID #: [id number] Rx #: [id number] Outcome: Approved today by [employer] Your PA request has been approved. Additional information will be provided in the approval communication. Effective Date: 09/24/2024 Authorization Expiration Date: 04/22/2025

## 2024-10-28 ENCOUNTER — Other Ambulatory Visit (INDEPENDENT_AMBULATORY_CARE_PROVIDER_SITE_OTHER): Payer: Self-pay | Admitting: Adult Health

## 2024-11-15 ENCOUNTER — Other Ambulatory Visit (INDEPENDENT_AMBULATORY_CARE_PROVIDER_SITE_OTHER): Payer: Self-pay | Admitting: Adult Health

## 2024-11-15 DIAGNOSIS — E559 Vitamin D deficiency, unspecified: Secondary | ICD-10-CM

## 2024-11-27 ENCOUNTER — Ambulatory Visit (INDEPENDENT_AMBULATORY_CARE_PROVIDER_SITE_OTHER): Admitting: Adult Health

## 2024-11-27 ENCOUNTER — Encounter: Admitting: Family

## 2024-11-27 VITALS — BP 141/85 | HR 87 | Temp 98.1°F | Ht 70.0 in | Wt 387.0 lb

## 2024-11-27 DIAGNOSIS — G4733 Obstructive sleep apnea (adult) (pediatric): Secondary | ICD-10-CM

## 2024-11-27 DIAGNOSIS — E559 Vitamin D deficiency, unspecified: Secondary | ICD-10-CM

## 2024-11-27 DIAGNOSIS — E88819 Insulin resistance, unspecified: Secondary | ICD-10-CM | POA: Diagnosis not present

## 2024-11-27 DIAGNOSIS — Z6841 Body Mass Index (BMI) 40.0 and over, adult: Secondary | ICD-10-CM | POA: Diagnosis not present

## 2024-11-27 DIAGNOSIS — R7989 Other specified abnormal findings of blood chemistry: Secondary | ICD-10-CM | POA: Diagnosis not present

## 2024-11-27 DIAGNOSIS — E669 Obesity, unspecified: Secondary | ICD-10-CM

## 2024-11-27 MED ORDER — VITAMIN D (ERGOCALCIFEROL) 1.25 MG (50000 UNIT) PO CAPS: ORAL_CAPSULE | ORAL | 0 refills | Status: DC

## 2024-11-27 MED ORDER — METFORMIN HCL ER 500 MG PO TB24: ORAL_TABLET | ORAL | 0 refills | Status: AC

## 2024-11-27 MED ORDER — WEGOVY 0.25 MG/0.5ML ~~LOC~~ SOAJ: 0.2500 mg | SUBCUTANEOUS | 0 refills | Status: DC

## 2024-11-27 NOTE — Progress Notes (Signed)
 "    WEIGHT SUMMARY AND BIOMETRICS  Vitals Temp: 98.1 F (36.7 C) BP: (!) 141/85 Pulse Rate: 87 SpO2: 97 %   Anthropometric Measurements Height: 5' 10 (1.778 m) Weight: (!) 387 lb (175.5 kg) BMI (Calculated): 55.53 Weight at Last Visit: 386 lb Weight Lost Since Last Visit: 0 Weight Gained Since Last Visit: 1 lb Starting Weight: 392 lb Total Weight Loss (lbs): 5 lb (2.268 kg) Peak Weight: 485 lb   Body Composition  Body Fat %: 43.4 % Fat Mass (lbs): 168.2 lbs Muscle Mass (lbs): 208.6 lbs Total Body Water (lbs): 153 lbs Visceral Fat Rating : 32   Other Clinical Data Fasting: no Labs: no Today's Visit #: 59 Starting Date: 08/10/20    Chief Complaint:   OBESITY Shawn [name] is here to discuss his progress with his obesity treatment plan.  He is on the the Category 4 Plan and states he is following his eating plan approximately 0 % of the time.  He states he is exercising: None.  Interim History:  10/23/2024-started on Saxenda  He reports starting at 0.6mg , titrated up to 2.4mg  IN ONE WEEK. He experienced dizziness, abdominal tingling, and states I just felt off. He stopped after one week of daily GLP-1 therapy  Reviewed at length the ordered titration.  He requests to resume Wegovy - however requests max dose of Wegovy  2.4mg   for cost savings. Discussed at length the appropriate titration schedule and will not simply restart at max dose with last use of Wegovy  summer 2025. Reviewed that he likely experienced SE with Saxenda  due to inappropriately fast titration.  Patient was counseled on the importance of maintaining healthy lifestyle habits, including balanced nutrition, regular physical activity, and behavioral modifications, while taking antiobesity medication.   Patient verbalized understanding that medication is an adjunct to, not a replacement for, lifestyle changes and that the long-term success and weight maintenance depend on continued adherence to  these strategies.   Subjective:   1. Vitamin D  deficiency  Latest Reference Range & Units 12/21/23 11:27 03/21/24 15:10 08/15/24 15:04  Vitamin D , 25-Hydroxy 30.0 - 100.0 ng/mL 58.3 37.0 26.5 (L)  (L): Data is abnormally low  Vit D level remains well below goal of 50-70 He is on weekly Ergocalciferol   2. Elevated serum creatinine  Latest Reference Range & Units 07/12/23 10:09 12/21/23 11:27 08/15/24 15:04  Creatinine 0.76 - 1.27 mg/dL 8.67 (H) 8.69 (H) 8.89  (H): Data is abnormally high  BP above goal at OV He reports eating a late lunch and rushing into OV  3. Insulin  resistance  Latest Reference Range & Units 07/12/23 10:09 12/21/23 11:27 08/15/24 15:04  INSULIN  2.6 - 24.9 uIU/mL 16.9 22.7 13.8   Lab Results  Component Value Date   HGBA1C 5.5 08/15/2024   HGBA1C 5.5 12/21/2023   HGBA1C 5.5 05/03/2023    He is on Metformin  500mg  XR once daily- denies GI upset  He denies family hx of MENS 2 or MTC He denies personal hx of pancreatitis He is agreeable to restart Wegovy  at the loading dose of 0.25mg  weekly injection  4. OSA (obstructive sleep apnea) Last use of nightly CPAP was 2023 He denies excessive daytime drowsiness  Assessment/Plan:   1. Vitamin D  deficiency (Primary) Refill - Vitamin D , Ergocalciferol , (DRISDOL ) 1.25 MG (50000 UNIT) CAPS capsule; TAKE ONE CAPSULE BY MOUTH ONCE A EVERY 7 DAYS  Dispense: 4 capsule; Refill: 0  2. Elevated serum creatinine Take all antihypertensive therapy as directed  3. Insulin  resistance Refill - metFORMIN  (GLUCOPHAGE -XR)  500 MG 24 hr tablet; 1 tab with largest meal  Dispense: 30 tablet; Refill: 0  4. OSA (obstructive sleep apnea) Restart - semaglutide -weight management (WEGOVY ) 0.25 MG/0.5ML SOAJ SQ injection; Inject 0.25 mg into the skin once a week.  Dispense: 2 mL; Refill: 0  5. Obesity with current BMI of 55.6 Restart - semaglutide -weight management (WEGOVY ) 0.25 MG/0.5ML SOAJ SQ injection; Inject 0.25 mg into the  skin once a week.  Dispense: 2 mL; Refill: 0  COMPLETE PA MONDAY 12/02/2024 ONCE NEW INSURANCE HAS STARTED  Shawn [name] is not currently in the action stage of change. As such, his goal is to get back to weightloss efforts . He has agreed to the Category 4 Plan.   Exercise goals: All adults should avoid inactivity. Some physical activity is better than none, and adults who participate in any amount of physical activity gain some health benefits. Adults should also include muscle-strengthening activities that involve all major muscle groups on 2 or more days a week.  Behavioral modification strategies: increasing lean protein intake, decreasing simple carbohydrates, increasing vegetables, increasing water intake, no skipping meals, meal planning and cooking strategies, keeping healthy foods in the home, ways to avoid boredom eating, better snacking choices, emotional eating strategies, avoiding temptations, planning for success, and decreasing junk food.  Shawn [name] has agreed to follow-up with our clinic in 4 weeks. He was informed of the importance of frequent follow-up visits to maximize his success with intensive lifestyle modifications for his multiple health conditions.   Objective:   Blood pressure (!) 141/85, pulse 87, temperature 98.1 F (36.7 C), height 5' 10 (1.778 m), weight (!) 387 lb (175.5 kg), SpO2 97%. Body mass index is 55.53 kg/m.  General: Cooperative, alert, well developed, in no acute distress. HEENT: Conjunctivae and lids unremarkable. Cardiovascular: Regular rhythm.  Lungs: Normal work of breathing. Neurologic: No focal deficits.   Lab Results  Component Value Date   CREATININE 1.10 08/15/2024   BUN 10 08/15/2024   NA 137 08/15/2024   K 4.1 08/15/2024   CL 99 08/15/2024   CO2 25 08/15/2024   Lab Results  Component Value Date   ALT 14 08/15/2024   AST 21 08/15/2024   ALKPHOS 84 08/15/2024   BILITOT 0.4 08/15/2024   Lab Results  Component Value Date   HGBA1C  5.5 08/15/2024   HGBA1C 5.5 12/21/2023   HGBA1C 5.5 05/03/2023   HGBA1C 5.4 11/24/2022   HGBA1C 5.5 07/21/2022   Lab Results  Component Value Date   INSULIN  13.8 08/15/2024   INSULIN  22.7 12/21/2023   INSULIN  16.9 07/12/2023   INSULIN  23.0 11/24/2022   INSULIN  12.8 07/21/2022   Lab Results  Component Value Date   TSH 1.070 02/08/2022   Lab Results  Component Value Date   CHOL 216 (H) 08/30/2023   HDL 39.40 08/30/2023   LDLCALC 154 (H) 08/30/2023   LDLDIRECT 141 (H) 12/11/2012   TRIG 116.0 08/30/2023   CHOLHDL 5 08/30/2023   Lab Results  Component Value Date   VD25OH 26.5 (L) 08/15/2024   VD25OH 37.0 03/21/2024   VD25OH 58.3 12/21/2023   Lab Results  Component Value Date   WBC 6.7 08/30/2023   HGB 14.6 08/30/2023   HCT 43.6 08/30/2023   MCV 82.8 08/30/2023   PLT 395.0 08/30/2023   No results found for: IRON, TIBC, FERRITIN  Attestation Statements:   Reviewed by clinician on day of visit: allergies, medications, problem list, medical history, surgical history, family history, social history, and previous encounter notes.  I have reviewed the above documentation for accuracy and completeness, and I agree with the above. -  Shawn Sadler d. Monty Spicher, NP-C "

## 2024-11-28 ENCOUNTER — Encounter (INDEPENDENT_AMBULATORY_CARE_PROVIDER_SITE_OTHER): Payer: Self-pay | Admitting: Adult Health

## 2024-11-29 ENCOUNTER — Encounter (INDEPENDENT_AMBULATORY_CARE_PROVIDER_SITE_OTHER): Payer: Self-pay | Admitting: Adult Health

## 2024-12-02 ENCOUNTER — Encounter (INDEPENDENT_AMBULATORY_CARE_PROVIDER_SITE_OTHER): Payer: Self-pay | Admitting: Adult Health

## 2024-12-02 ENCOUNTER — Ambulatory Visit: Admitting: Family

## 2024-12-02 ENCOUNTER — Encounter: Payer: Self-pay | Admitting: Family

## 2024-12-02 ENCOUNTER — Other Ambulatory Visit (INDEPENDENT_AMBULATORY_CARE_PROVIDER_SITE_OTHER): Payer: Self-pay | Admitting: Adult Health

## 2024-12-02 VITALS — BP 160/91 | HR 82 | Temp 97.9°F | Ht 70.0 in | Wt >= 6400 oz

## 2024-12-02 DIAGNOSIS — Z0000 Encounter for general adult medical examination without abnormal findings: Secondary | ICD-10-CM

## 2024-12-02 DIAGNOSIS — E66813 Obesity, class 3: Secondary | ICD-10-CM

## 2024-12-02 DIAGNOSIS — Z5320 Procedure and treatment not carried out because of patient's decision for unspecified reasons: Secondary | ICD-10-CM

## 2024-12-02 DIAGNOSIS — Z6841 Body Mass Index (BMI) 40.0 and over, adult: Secondary | ICD-10-CM | POA: Diagnosis not present

## 2024-12-02 DIAGNOSIS — G4733 Obstructive sleep apnea (adult) (pediatric): Secondary | ICD-10-CM

## 2024-12-02 DIAGNOSIS — E782 Mixed hyperlipidemia: Secondary | ICD-10-CM

## 2024-12-02 DIAGNOSIS — I10 Essential (primary) hypertension: Secondary | ICD-10-CM

## 2024-12-02 MED ORDER — ZEPBOUND 2.5 MG/0.5ML ~~LOC~~ SOAJ: 2.5000 mg | SUBCUTANEOUS | 0 refills | Status: DC

## 2024-12-02 MED ORDER — HYDROCHLOROTHIAZIDE 25 MG PO TABS: 25.0000 mg | ORAL_TABLET | Freq: Every day | ORAL | 1 refills | Status: AC

## 2024-12-02 MED ORDER — LISINOPRIL 40 MG PO TABS: 40.0000 mg | ORAL_TABLET | Freq: Every day | ORAL | 1 refills | Status: AC

## 2024-12-02 NOTE — Telephone Encounter (Signed)
 PA was started. Torez Isaza (Key: [id number]) Rx #: [id number] Zepbound  2.5MG /0.5ML pen-injectors

## 2024-12-02 NOTE — Progress Notes (Unsigned)
 " Phone: [phone]  Subjective:  Patient 39 y.o. male presenting for annual physical.  Chief Complaint  Patient presents with   Annual Exam    Non fasting w/ labs   Hypertension    Follow up   See problem oriented charting- ROS- full  review of systems was completed and negative.   The following were reviewed and entered/updated in epic: Past Medical History:  Diagnosis Date   Allergy    Chest pain    Depression, recurrent 01/28/2021   Eczema    Encounter for assessment of STD exposure 09/10/2021   Multiple fractures of ribs, left side, initial encounter for closed fracture 02/04/2020   Pain in both feet    Right foot pain 08/24/2021   Shoulder pain    Urticaria    Patient Active Problem List   Diagnosis Date Noted   SOB (shortness of breath) on exertion 08/15/2024   Mixed hyperlipidemia 08/30/2023   At risk for heart disease 07/24/2022   IFG (impaired fasting glucose) 05/10/2022   Gastroesophageal reflux disease without esophagitis 02/17/2022   Increased heart rate 01/12/2022   Insulin  resistance 09/30/2021   Chronic bilateral low back pain without sciatica 09/10/2021   Elevated random blood glucose level 05/04/2021   Anxiety and depression 02/17/2021   Class 3 severe obesity with serious comorbidity and body mass index (BMI) of 50.0 to 59.9 in adult (HCC) 01/26/2021   OSA (obstructive sleep apnea) 11/16/2020   Male pattern alopecia 10/23/2020   Vitamin D  deficiency 03/25/2020   Essential hypertension 03/23/2020   PTSD (post-traumatic stress disorder) 03/23/2020   Osteoarthritis of knee 11/07/2018   Hypercholesteremia 10/24/2018   History reviewed. No pertinent surgical history.  Family History  Problem Relation Age of Onset   Allergic rhinitis Mother    Arthritis Mother    Sleep apnea Mother    Obesity Mother    Diabetes Maternal Aunt    Hypertension Maternal Aunt    Diabetes Maternal Uncle    Cataracts Maternal  Grandfather     Medications- reviewed and updated Current Outpatient Medications  Medication Sig Dispense Refill   Acetaminophen  (TYLENOL  PO) Take by mouth.     AMBULATORY NON FORMULARY MEDICATION Massage therapy to manage chronic back pain.  Treat as needed 1 each 0   buPROPion  (WELLBUTRIN  XL) 300 MG 24 hr tablet Take 1 tablet (300 mg total) by mouth daily. 90 tablet 1   cetirizine  (ZYRTEC  ALLERGY) 10 MG tablet Take 1 tablet (10 mg total) by mouth 2 (two) times daily. 60 tablet 5   clobetasol ointment (TEMOVATE) 0.05 % Apply to affected scalp 4 times per week. Not to face.     clotrimazole -betamethasone  (LOTRISONE ) cream Apply 1 Application topically daily. Apply to cracked skin around mouth twice a day. Cover with a thick cream or petroleum. Use for 1 week at a time. 30 g 1   cyclobenzaprine  (FLEXERIL ) 10 MG tablet Take 1 tablet (10 mg total) by mouth 3 (three) times daily as needed for muscle spasms. 90 tablet 2   EPINEPHrine  (EPIPEN  2-PAK) 0.3 mg/0.3 mL IJ SOAJ injection Inject 0.3 mg into the muscle as needed for anaphylaxis. 2 each 1   famotidine  (PEPCID ) 20 MG tablet Take 1 tablet (20 mg total) by mouth 2 (two) times daily. 60 tablet 5   hydrocortisone 2.5 % cream Apply topically.     ketoconazole (NIZORAL) 2 % shampoo Apply topically 2 (two) times a week.     metFORMIN  (GLUCOPHAGE -XR) 500 MG 24 hr tablet 1  tab with largest meal 30 tablet 0   minoxidil (LONITEN) 2.5 MG tablet Take 2 pills by mouth daily     omeprazole  (PRILOSEC) 40 MG capsule Take 1 capsule (40 mg total) by mouth daily. 90 capsule 1   tacrolimus (PROTOPIC) 0.1 % ointment Apply to affected areas on lips daily to twice daily as needed.     tirzepatide  (ZEPBOUND ) 2.5 MG/0.5ML Pen Inject 2.5 mg into the skin once a week. 2 mL 0   Vitamin D , Ergocalciferol , (DRISDOL ) 1.25 MG (50000 UNIT) CAPS capsule TAKE ONE CAPSULE BY MOUTH ONCE A EVERY 7 DAYS 4 capsule 0   econazole nitrate 1 % cream Apply 1 Application  topically daily. (Patient not taking: Reported on 11/27/2024)     hydrochlorothiazide  (HYDRODIURIL ) 25 MG tablet Take 1 tablet (25 mg total) by mouth daily. 90 tablet 1   lisinopril  (ZESTRIL ) 40 MG tablet Take 1 tablet (40 mg total) by mouth daily. 90 tablet 1   No current facility-administered medications for this visit.    Allergies-reviewed and updated No Known Allergies  Social History   Social History Narrative   Not on file    Objective:  BP (!) 160/91 (BP Location: Left Arm, Patient Position: Sitting, Cuff Size: Large)   Pulse 82   Temp 97.9 F (36.6 C) (Temporal)   Ht 5' 10 (1.778 m)   Wt (!) 402 lb (182.3 kg)   SpO2 96%   BMI 57.68 kg/m  Physical Exam Vitals and nursing note reviewed.  Constitutional:      General: He is not in acute distress.    Appearance: Normal appearance. He is morbidly obese.  HENT:     Head: Normocephalic.     Right Ear: Tympanic membrane and external ear normal.     Left Ear: Tympanic membrane and external ear normal.     Nose: Nose normal.     Mouth/Throat:     Mouth: Mucous membranes are moist.  Eyes:     Extraocular Movements: Extraocular movements intact.  Cardiovascular:     Rate and Rhythm: Normal rate and regular rhythm.  Pulmonary:     Effort: Pulmonary effort is normal.     Breath sounds: Normal breath sounds.  Abdominal:     General: Abdomen is flat. There is no distension.     Palpations: Abdomen is soft.     Tenderness: There is no abdominal tenderness.  Musculoskeletal:        General: Normal range of motion.     Cervical back: Normal range of motion.  Skin:    General: Skin is warm and dry.  Neurological:     Mental Status: He is alert and oriented to person, place, and time.  Psychiatric:        Mood and Affect: Mood normal.        Behavior: Behavior normal.        Judgment: Judgment normal.     Assessment and Plan   Health Maintenance counseling: 1. Anticipatory guidance: Patient counseled regarding  regular dental exams q6 months, eye exams yearly, avoiding smoking and second hand smoke, limiting alcohol to 2 beverages per day.   2. Risk factor reduction:  Advised patient of need for regular exercise and diet rich in fruits and vegetables to reduce risk of heart attack and stroke.    Wt Readings from Last 3 Encounters:  12/02/24 (!) 402 lb (182.3 kg)  11/27/24 (!) 387 lb (175.5 kg)  10/23/24 (!) 386 lb (175.1 kg)  3. Immunizations/screenings/ancillary studies Immunization History  Administered Date(s) Administered   PFIZER(Purple Top)SARS-COV-2 Vaccination 09/21/2020, 10/12/2020   Tdap 10/22/2018   Health Maintenance Due  Topic Date Due   Pneumococcal Vaccine (1 of 2 - PCV) Never done   Hepatitis B Vaccines 19-59 Average Risk (1 of 3 - 19+ 3-dose series) Never done   HPV VACCINES (1 - Risk 3-dose SCDM series) Never done    4. Skin cancer screening-  advised regular sunscreen use. Denies worrisome, changing, or new skin lesions.  5. Smoking associated screening: non- smoker  6. STD screening - does not need today 7. Alcohol screening: rare   1. Annual physical exam (Primary) Labs done at [facility] 3 mos ago including CMP and A1C.  - Lipid panel - CBC with Differential/Platelet  2. Class 3 severe obesity with serious comorbidity and body mass index (BMI) of 50.0 to 59.9 in adult, unspecified obesity type (HCC) Working with [facility], but no significant change in weight, continues to yoyo up and down. Reports having a hard time when government shut down and staying home. Trying to get back on track.  3. Mixed hyperlipidemia Last lipids 1 yr ago elevated. Working on lifestyle changes. - Recheck lipid panel today  4. Essential hypertension Does not take meds consistently, reports taking this am, but BP still high. No change in weight with [facility]. Does not want to switch medications. Advised on ill effects to health with uncontrolled BP.  - lisinopril  (ZESTRIL ) 40 MG tablet; Take 1  tablet (40 mg total) by mouth daily.  Dispense: 90 tablet; Refill: 1 - hydrochlorothiazide  (HYDRODIURIL ) 25 MG tablet; Take 1 tablet (25 mg total) by mouth daily.  Dispense: 90 tablet; Refill: 1  Recommended follow up: Return in about 6 months (around 06/01/2025) for HTN, med refills. Future Appointments  Date Time Provider Department Center  12/26/2024  3:00 PM Danford, Rockie BIRCH, NP [facility] None    Lab/Order associations: non- fasting   Lucius Krabbe, NP   "

## 2024-12-02 NOTE — Patient Instructions (Addendum)
 It was very nice to see you today!   I will review your lab results via MyChart in a few days. Your blood pressure is high! Take the medications every day! Avoid extra sodium in your diet. Keep drinking at least 3 liters of caffeine free liquids daily. Exercise more!     PLEASE NOTE:  If you had any lab tests please let us  know if you have not heard back within a few days. You may see your results on MyChart before we have a chance to review them but we will give you a call once they are reviewed by us . If we ordered any referrals today, please let us  know if you have not heard from their office within the next week.

## 2024-12-03 ENCOUNTER — Other Ambulatory Visit (INDEPENDENT_AMBULATORY_CARE_PROVIDER_SITE_OTHER): Payer: Self-pay | Admitting: Adult Health

## 2024-12-03 LAB — LIPID PANEL
Cholesterol: 219 mg/dL — ABNORMAL HIGH (ref 28–200)
HDL: 37.7 mg/dL — ABNORMAL LOW
LDL Cholesterol: 145 mg/dL — ABNORMAL HIGH (ref 10–99)
NonHDL: 181.19
Total CHOL/HDL Ratio: 6
Triglycerides: 182 mg/dL — ABNORMAL HIGH (ref 10.0–149.0)
VLDL: 36.4 mg/dL (ref 0.0–40.0)

## 2024-12-03 LAB — CBC WITH DIFFERENTIAL/PLATELET
Basophils Absolute: 0.1 K/uL (ref 0.0–0.1)
Basophils Relative: 1.1 % (ref 0.0–3.0)
Eosinophils Absolute: 0.2 K/uL (ref 0.0–0.7)
Eosinophils Relative: 4.3 % (ref 0.0–5.0)
HCT: 41.4 % (ref 39.0–52.0)
Hemoglobin: 14.1 g/dL (ref 13.0–17.0)
Lymphocytes Relative: 39.1 % (ref 12.0–46.0)
Lymphs Abs: 2.1 K/uL (ref 0.7–4.0)
MCHC: 34.1 g/dL (ref 30.0–36.0)
MCV: 80.7 fl (ref 78.0–100.0)
Monocytes Absolute: 0.5 K/uL (ref 0.1–1.0)
Monocytes Relative: 10 % (ref 3.0–12.0)
Neutro Abs: 2.5 K/uL (ref 1.4–7.7)
Neutrophils Relative %: 45.5 % (ref 43.0–77.0)
Platelets: 363 K/uL (ref 150.0–400.0)
RBC: 5.14 Mil/uL (ref 4.22–5.81)
RDW: 14.2 % (ref 11.5–15.5)
WBC: 5.4 K/uL (ref 4.0–10.5)

## 2024-12-03 MED ORDER — WEGOVY 0.25 MG/0.5ML ~~LOC~~ SOAJ: 0.2500 mg | SUBCUTANEOUS | 0 refills | Status: AC

## 2024-12-04 ENCOUNTER — Ambulatory Visit: Payer: Self-pay | Admitting: Family

## 2024-12-04 DIAGNOSIS — Z5320 Procedure and treatment not carried out because of patient's decision for unspecified reasons: Secondary | ICD-10-CM | POA: Insufficient documentation

## 2024-12-10 ENCOUNTER — Telehealth: Payer: Self-pay

## 2024-12-10 NOTE — Telephone Encounter (Signed)
 PA submitted through Cover My Meds for Wegovy . Awaiting insurance determination. Key: [id number]

## 2024-12-11 ENCOUNTER — Telehealth (INDEPENDENT_AMBULATORY_CARE_PROVIDER_SITE_OTHER): Payer: Self-pay

## 2024-12-11 NOTE — Telephone Encounter (Signed)
 [facility] ([name]) Rx #: [id number] Wegovy  0.25MG /0.5ML auto-injectors

## 2024-12-12 ENCOUNTER — Telehealth (INDEPENDENT_AMBULATORY_CARE_PROVIDER_SITE_OTHER): Payer: Self-pay | Admitting: Adult Health

## 2024-12-12 NOTE — Telephone Encounter (Signed)
 Pt would like for Shena to call him regarding the Prior Authorization issue with Wegovy  for [employer]. Patient needs a letter sent to the pharmacy. Please call patient at [phone].

## 2024-12-19 ENCOUNTER — Telehealth (INDEPENDENT_AMBULATORY_CARE_PROVIDER_SITE_OTHER): Payer: Self-pay

## 2024-12-19 ENCOUNTER — Telehealth (INDEPENDENT_AMBULATORY_CARE_PROVIDER_SITE_OTHER): Payer: Self-pay | Admitting: Adult Health

## 2024-12-19 NOTE — Telephone Encounter (Signed)
 Medical Letter

## 2024-12-19 NOTE — Telephone Encounter (Signed)
 Patient called  and asked about a letter that was supposed to be sent via mychart. He said he spoke to [name] late yesterday afternoon (01/28) and hasn't seen it yet.

## 2024-12-20 ENCOUNTER — Encounter (INDEPENDENT_AMBULATORY_CARE_PROVIDER_SITE_OTHER): Payer: Self-pay | Admitting: Adult Health

## 2024-12-20 ENCOUNTER — Ambulatory Visit (INDEPENDENT_AMBULATORY_CARE_PROVIDER_SITE_OTHER): Admitting: Adult Health

## 2024-12-20 VITALS — BP 142/80 | HR 75 | Temp 98.7°F | Ht 70.0 in | Wt 396.0 lb

## 2024-12-20 DIAGNOSIS — E559 Vitamin D deficiency, unspecified: Secondary | ICD-10-CM | POA: Diagnosis not present

## 2024-12-20 DIAGNOSIS — G4733 Obstructive sleep apnea (adult) (pediatric): Secondary | ICD-10-CM

## 2024-12-20 DIAGNOSIS — Z6841 Body Mass Index (BMI) 40.0 and over, adult: Secondary | ICD-10-CM

## 2024-12-20 DIAGNOSIS — I10 Essential (primary) hypertension: Secondary | ICD-10-CM

## 2024-12-20 DIAGNOSIS — E782 Mixed hyperlipidemia: Secondary | ICD-10-CM

## 2024-12-20 MED ORDER — VITAMIN D (ERGOCALCIFEROL) 1.25 MG (50000 UNIT) PO CAPS: ORAL_CAPSULE | ORAL | 0 refills | Status: AC

## 2024-12-20 NOTE — Progress Notes (Unsigned)
 "    WEIGHT SUMMARY AND BIOMETRICS  Vitals Temp: 98.7 F (37.1 C) BP: (!) 142/80 Pulse Rate: 75 SpO2: 99 %   Anthropometric Measurements Height: 5' 10 (1.778 m) Weight: (!) 396 lb (179.6 kg) BMI (Calculated): 56.82 Weight at Last Visit: 387lb Weight Lost Since Last Visit: 1lb Weight Gained Since Last Visit: 0lb Starting Weight: 392lb Total Weight Loss (lbs): 6 lb (2.722 kg) Peak Weight: 485lb   Body Composition  Body Fat %: 44 % Fat Mass (lbs): 174.6 lbs Muscle Mass (lbs): 211.2 lbs Total Body Water (lbs): 156 lbs Visceral Fat Rating : 33   Other Clinical Data Fasting: No Labs: No Today's Visit #: 48 Starting Date: 08/10/20    Chief Complaint:   OBESITY Shawn [name] is here to discuss his progress with his obesity treatment plan.  He is on the the Category 4 Plan and states he is following his eating plan approximately 30 % of the time.  He states he is exercising: None  Interim History:  12/02/2024 Annual Physical with fasting labs- reviewed In EPIC  Shawn [name] endorses stress at work and home. He has been working remotely all week due to recent winter weather. Several of his extended family members have been either acutely ill or dx'd with cancer. He and his fiancee are planning their wedding.  HWW submitted Letter of Medical Necessity for injectable AOM- letter provided to pt today.  Subjective:   1. OSA (obstructive sleep apnea) Current weight 396 lbs with corresponding BMI 56.9  2. Vitamin D  deficiency  Latest Reference Range & Units 12/21/23 11:27 03/21/24 15:10 08/15/24 15:04  Vitamin D , 25-Hydroxy 30.0 - 100.0 ng/mL 58.3 37.0 26.5 (L)  (L): Data is abnormally low  3. Essential hypertension BP above goal at OV He had recent annual physical with CPE- BP well above goal at the at OV He has been inconsistently taking antihypertensive therapy. He denies acute cardiac sx's at present Reviewed the importance of controlling HTN  4. Mixed  hyperlipidemia Recent Lipid Panel with PCP- uncontrolled He denies CP with exertion Wegovy  would be a beneficial addition to medication regime He denies family hx of MENS 2 or MTC He denies personal hx of pancreatitis   Assessment/Plan:   1. OSA (obstructive sleep apnea) Start- NEW THERAPY semaglutide -weight management (WEGOVY ) 0.25 MG/0.5ML SOAJ SQ injection Inject 0.25 mg into the skin once a week. Dispense: 2 mL, Refills: 0 ordered   2. Vitamin D  deficiency Refill - Vitamin D , Ergocalciferol , (DRISDOL ) 1.25 MG (50000 UNIT) CAPS capsule; TAKE ONE CAPSULE BY MOUTH ONCE A EVERY 7 DAYS  Dispense: 4 capsule; Refill: 0  3. Essential hypertension Start- NEW THERAPY semaglutide -weight management (WEGOVY ) 0.25 MG/0.5ML SOAJ SQ injection Inject 0.25 mg into the skin once a week. Dispense: 2 mL, Refills: 0 ordered   4. Mixed hyperlipidemia Start- NEW THERAPY semaglutide -weight management (WEGOVY ) 0.25 MG/0.5ML SOAJ SQ injection Inject 0.25 mg into the skin once a week. Dispense: 2 mL, Refills: 0 ordered   5. Morbid obesity with BMI of 50.0-59.9, adult (HCC), CURRENT BMI 56.9 (Primary) Start- NEW THERAPY semaglutide -weight management (WEGOVY ) 0.25 MG/0.5ML SOAJ SQ injection Inject 0.25 mg into the skin once a week. Dispense: 2 mL, Refills: 0 ordered   Json is not currently in the action stage of change. As such, his goal is to get back to weightloss efforts . He has agreed to the Category 4 Plan.   Exercise goals: All adults should avoid inactivity. Some physical activity is better than none, and adults  who participate in any amount of physical activity gain some health benefits. Adults should also include muscle-strengthening activities that involve all major muscle groups on 2 or more days a week.  Behavioral modification strategies: increasing lean protein intake, decreasing simple carbohydrates, increasing vegetables, increasing water intake, meal planning and cooking strategies,  keeping healthy foods in the home, ways to avoid boredom eating, and planning for success.  Jerami has agreed to follow-up with our clinic in 4 weeks. He was informed of the importance of frequent follow-up visits to maximize his success with intensive lifestyle modifications for his multiple health conditions.   Objective:   Blood pressure (!) 142/80, pulse 75, temperature 98.7 F (37.1 C), height 5' 10 (1.778 m), weight (!) 396 lb (179.6 kg), SpO2 99%. Body mass index is 56.82 kg/m.  General: Cooperative, alert, well developed, in no acute distress. HEENT: Conjunctivae and lids unremarkable. Cardiovascular: Regular rhythm.  Lungs: Normal work of breathing. Neurologic: No focal deficits.   Lab Results  Component Value Date   CREATININE 1.10 08/15/2024   BUN 10 08/15/2024   NA 137 08/15/2024   K 4.1 08/15/2024   CL 99 08/15/2024   CO2 25 08/15/2024   Lab Results  Component Value Date   ALT 14 08/15/2024   AST 21 08/15/2024   ALKPHOS 84 08/15/2024   BILITOT 0.4 08/15/2024   Lab Results  Component Value Date   HGBA1C 5.5 08/15/2024   HGBA1C 5.5 12/21/2023   HGBA1C 5.5 05/03/2023   HGBA1C 5.4 11/24/2022   HGBA1C 5.5 07/21/2022   Lab Results  Component Value Date   INSULIN  13.8 08/15/2024   INSULIN  22.7 12/21/2023   INSULIN  16.9 07/12/2023   INSULIN  23.0 11/24/2022   INSULIN  12.8 07/21/2022   Lab Results  Component Value Date   TSH 1.070 02/08/2022   Lab Results  Component Value Date   CHOL 219 (H) 12/02/2024   HDL 37.70 (L) 12/02/2024   LDLCALC 145 (H) 12/02/2024   LDLDIRECT 141 (H) 12/11/2012   TRIG 182.0 (H) 12/02/2024   CHOLHDL 6 12/02/2024   Lab Results  Component Value Date   VD25OH 26.5 (L) 08/15/2024   VD25OH 37.0 03/21/2024   VD25OH 58.3 12/21/2023   Lab Results  Component Value Date   WBC 5.4 12/02/2024   HGB 14.1 12/02/2024   HCT 41.4 12/02/2024   MCV 80.7 12/02/2024   PLT 363.0 12/02/2024   No results found for: IRON, TIBC,  FERRITIN  Attestation Statements:   Reviewed by clinician on day of visit: allergies, medications, problem list, medical history, surgical history, family history, social history, and previous encounter notes.  I have reviewed the above documentation for accuracy and completeness, and I agree with the above. -  Terrell Ostrand d. Quashawn Jewkes, NP-C "

## 2024-12-22 ENCOUNTER — Other Ambulatory Visit (INDEPENDENT_AMBULATORY_CARE_PROVIDER_SITE_OTHER): Payer: Self-pay | Admitting: Adult Health

## 2024-12-22 DIAGNOSIS — E559 Vitamin D deficiency, unspecified: Secondary | ICD-10-CM

## 2024-12-26 ENCOUNTER — Ambulatory Visit (INDEPENDENT_AMBULATORY_CARE_PROVIDER_SITE_OTHER): Admitting: Adult Health

## 2025-01-16 ENCOUNTER — Ambulatory Visit (INDEPENDENT_AMBULATORY_CARE_PROVIDER_SITE_OTHER): Admitting: Family Medicine
